# Patient Record
Sex: Female | Born: 1945 | Race: Black or African American | Hispanic: No | Smoking: Never smoker
Health system: Southern US, Community
[De-identification: ages and names within clinical notes are randomized; demographics above are authoritative.]

## PROBLEM LIST (undated history)

## (undated) DIAGNOSIS — T8859XA Other complications of anesthesia, initial encounter: Secondary | ICD-10-CM

## (undated) DIAGNOSIS — R0600 Dyspnea, unspecified: Secondary | ICD-10-CM

## (undated) DIAGNOSIS — IMO0001 Reserved for inherently not codable concepts without codable children: Secondary | ICD-10-CM

## (undated) DIAGNOSIS — T4145XA Adverse effect of unspecified anesthetic, initial encounter: Secondary | ICD-10-CM

## (undated) DIAGNOSIS — Z5189 Encounter for other specified aftercare: Secondary | ICD-10-CM

## (undated) DIAGNOSIS — Z86718 Personal history of other venous thrombosis and embolism: Secondary | ICD-10-CM

## (undated) DIAGNOSIS — M1990 Unspecified osteoarthritis, unspecified site: Secondary | ICD-10-CM

## (undated) DIAGNOSIS — I493 Ventricular premature depolarization: Secondary | ICD-10-CM

## (undated) DIAGNOSIS — R002 Palpitations: Secondary | ICD-10-CM

## (undated) HISTORY — PX: COLONOSCOPY: SHX174

---
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
## 1979-11-04 HISTORY — PX: TUBAL LIGATION: SHX77

## 1999-04-17 ENCOUNTER — Other Ambulatory Visit: Admission: RE | Admit: 1999-04-17 | Discharge: 1999-04-17 | Payer: Self-pay | Admitting: Obstetrics and Gynecology

## 2000-07-07 ENCOUNTER — Other Ambulatory Visit: Admission: RE | Admit: 2000-07-07 | Discharge: 2000-07-07 | Payer: Self-pay | Admitting: Obstetrics and Gynecology

## 2000-10-12 ENCOUNTER — Emergency Department (HOSPITAL_COMMUNITY): Admission: EM | Admit: 2000-10-12 | Discharge: 2000-10-12 | Payer: Self-pay | Admitting: Emergency Medicine

## 2002-07-14 ENCOUNTER — Other Ambulatory Visit: Admission: RE | Admit: 2002-07-14 | Discharge: 2002-07-14 | Payer: Self-pay | Admitting: Obstetrics and Gynecology

## 2002-08-22 ENCOUNTER — Encounter (HOSPITAL_BASED_OUTPATIENT_CLINIC_OR_DEPARTMENT_OTHER): Payer: Self-pay | Admitting: General Surgery

## 2002-08-22 ENCOUNTER — Encounter: Admission: RE | Admit: 2002-08-22 | Discharge: 2002-08-22 | Payer: Self-pay | Admitting: General Surgery

## 2003-09-14 ENCOUNTER — Other Ambulatory Visit: Admission: RE | Admit: 2003-09-14 | Discharge: 2003-09-14 | Payer: Self-pay | Admitting: Obstetrics and Gynecology

## 2004-09-18 ENCOUNTER — Other Ambulatory Visit: Admission: RE | Admit: 2004-09-18 | Discharge: 2004-09-18 | Payer: Self-pay | Admitting: Obstetrics and Gynecology

## 2005-02-08 ENCOUNTER — Encounter: Admission: RE | Admit: 2005-02-08 | Discharge: 2005-02-08 | Payer: Self-pay | Admitting: Internal Medicine

## 2005-09-23 ENCOUNTER — Other Ambulatory Visit: Admission: RE | Admit: 2005-09-23 | Discharge: 2005-09-23 | Payer: Self-pay | Admitting: Obstetrics and Gynecology

## 2005-11-03 HISTORY — PX: JOINT REPLACEMENT: SHX530

## 2006-05-11 ENCOUNTER — Inpatient Hospital Stay (HOSPITAL_COMMUNITY): Admission: RE | Admit: 2006-05-11 | Discharge: 2006-05-16 | Payer: Self-pay | Admitting: Cardiovascular Disease

## 2007-02-23 ENCOUNTER — Encounter: Admission: RE | Admit: 2007-02-23 | Discharge: 2007-02-23 | Payer: Self-pay | Admitting: Orthopedic Surgery

## 2010-11-03 HISTORY — PX: FOOT SURGERY: SHX648

## 2011-02-17 HISTORY — PX: OTHER SURGICAL HISTORY: SHX169

## 2011-02-17 HISTORY — PX: BUNIONECTOMY: SHX129

## 2011-09-14 ENCOUNTER — Other Ambulatory Visit: Payer: Self-pay | Admitting: Orthopedic Surgery

## 2011-09-14 NOTE — H&P (Signed)
Christina Duarte DOB: 01/17/1946  Date Of Admission:  09/29/2011  Chief Complaint:  Left Knee Pain  History of Present Illness The patient is a 65 year old female who comes in today for a preoperative History and Physical. The patient is scheduled for a left total knee arthroplasty to be performed by Dr. Frank V. Aluisio, MD at [facility] on 09/29/2011.  Allergies ASPIRIN WITH CODEINE. "feels like I am flying high"  Medication History Lipitor (40MG Tablet, Oral at bedtime) Active. Metoprolol Succinate (50MG Tablet ER 24HR, Oral at bedtime) Active. Meloxicam (15MG Tablet, Oral every twenty-four hours, as needed) Active. Centrum Ultra Womens ( Oral daily) Active. Vitamin D3 (2000UNIT Capsule, Oral daily) Active.  Family History Diabetes Mellitus. mother Heart Disease. father Hypertension. mother  Social History Alcohol use. never consumed alcohol Children. 3 Living situation. live with parents Marital status. 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 working full time   Past Surgical History Tonsillectomy. Date: 1964. Tubal Ligation. Date: 1981. Total Knee Replacement - Right. Date: 2007. Foot Surgery - Right. Date: 2012.  Medical History Osteoarthritis, knee (715.96) Diverticulitis Of Colon Hypercholesterolemia Palpitations Varicose veins Menopause Measles. Childhood  Review of Systems General:Not Present- Chills, Fever, Night Sweats, Appetite Loss, Fatigue, Feeling sick, Weight Gain and Weight Loss. Skin:Not Present- Itching, Rash, Skin Color Changes, Ulcer, Psoriasis and Change in Hair or Nails. HEENT:Not Present- Sensitivity to light, Nose Bleed, Visual Loss, Decreased Hearing and Ringing in the Ears. Neck:Not Present- Swollen Glands and Neck Mass. Respiratory:Not Present- Shortness of breath, Snoring, Chronic Cough and Bloody  sputum. Cardiovascular:Present- Palpitations. Not Present- Shortness of Breath, Chest Pain, Swelling of Extremities and Leg Cramps. Gastrointestinal:Not Present- Bloody Stool, Heartburn, Abdominal Pain, Vomiting, Nausea and Incontinence of Stool. Female Genitourinary:Not Present- Blood in Urine, Frequency, Incontinence and Nocturia. Musculoskeletal:Present- Muscle Weakness, Joint Stiffness, Joint Pain and Back Pain. Not Present- Muscle Pain and Joint Swelling. Neurological:Not Present- Tingling, Numbness, Burning, Tremor, Headaches and Dizziness. Psychiatric:Not Present- Anxiety, Depression and Memory Loss. Endocrine:Not Present- Cold Intolerance, Heat Intolerance, Excessive hunger and Excessive Thirst. Hematology:Not Present- Abnormal Bleeding, Abnormal bruising, Anemia and Blood Clots.  Vitals Weight: 216 lb Height: 68.5 in Weight was reported by patient. Height was reported by patient. Body Surface Area: 2.18 m Body Mass Index: 32.36 kg/m Pulse: 68 (Regular) Resp.: 14 (Unlabored) BP: 136/76 (Sitting, Right Arm, Standard)  Physical Exam The physical exam findings are as follows:  General-65 yo female  Mental Status - Alert, cooperative and good historian. General Appearance- pleasant. Not in acute distress. Orientation- Oriented X3. Build & Nutrition- Well nourished and Well developed. Head and Neck Head- normocephalic, atraumatic . Neck Global Assessment- supple. no bruit auscultated on the right and no bruit auscultated on the left.  Eye Pupil- Bilateral- PERRLA. Motion- Bilateral- EOMI. Note: Noted to wear glasses.  Chest and Lung Exam Auscultation: Breath sounds:- clear at anterior chest wall and - clear at posterior chest wall. Adventitious sounds:- No Adventitious sounds.  Cardiovascular Auscultation:Rhythm- Regular rate and rhythm. Heart Sounds- S1 WNL and S2 WNL. Murmurs & Other Heart Sounds:Auscultation of the heart  reveals - No Murmurs.  Abdomen Inspection:Contour- Note: Round Palpation/Percussion:Tenderness- Abdomen is non-tender to palpation. Rigidity (guarding)- Abdomen is soft. Auscultation:Auscultation of the abdomen reveals - Bowel sounds normal.  Female Genitourinary Note: Not done, not pertinent to present illness  Musculoskeletal Note: Well developed female alert and oriented in no apparent distress. Right knee incision is well healed with minimal scarring. Range of motion   on the right knee is about 0-115. There is no swelling, tenderness or instability. Left knee shows no effusion. She has a varus deformity. There is marked crepitus on range of motion. Range is about 5-115. She is tender medial greater than lateral with no instability noted. Pulse, sensation and motor are intact both lower extremities. Gait pattern is antalgic on the left side.   RADIOGRAPHS: AP both knees and lateral left show that she has advanced end stage arthritis of the left knee, bone on bone in the medial and patellofemoral compartments with a varus deformity. Her right knee shows a well placed prosthesis with no abnormalities.  Assessment & Plan Osteoarthritis, knee (715.96) Impression: Left Knee  Note: Plan is for a Left Total Knee Replacement. Risks and benefits of the surgery have been discussed with the patient and they elect to proceed with surgery.  There are on active contraindications to upcoming procedure such as ongoing infection or progressive neurological disease. Patient has been seen preoperatively by Dr. Richard Pang and has been cleared for surgery.  

## 2011-09-16 ENCOUNTER — Encounter (HOSPITAL_COMMUNITY): Payer: Self-pay

## 2011-09-18 ENCOUNTER — Ambulatory Visit (HOSPITAL_COMMUNITY)
Admission: RE | Admit: 2011-09-18 | Discharge: 2011-09-18 | Disposition: A | Discharge status: Home / Self Care | Payer: Federal, State, Local not specified - PPO | Source: Ambulatory Visit | Attending: Orthopedic Surgery | Admitting: Orthopedic Surgery

## 2011-09-18 ENCOUNTER — Encounter (HOSPITAL_COMMUNITY): Payer: Self-pay

## 2011-09-18 ENCOUNTER — Encounter (HOSPITAL_COMMUNITY)
Admission: RE | Admit: 2011-09-18 | Discharge: 2011-09-18 | Payer: Federal, State, Local not specified - PPO | Source: Ambulatory Visit | Attending: Orthopedic Surgery | Admitting: Orthopedic Surgery

## 2011-09-18 DIAGNOSIS — R002 Palpitations: Secondary | ICD-10-CM | POA: Insufficient documentation

## 2011-09-18 DIAGNOSIS — M47814 Spondylosis without myelopathy or radiculopathy, thoracic region: Secondary | ICD-10-CM | POA: Insufficient documentation

## 2011-09-18 DIAGNOSIS — Z01812 Encounter for preprocedural laboratory examination: Secondary | ICD-10-CM | POA: Insufficient documentation

## 2011-09-18 DIAGNOSIS — M1710 Unilateral primary osteoarthritis, unspecified knee: Secondary | ICD-10-CM | POA: Insufficient documentation

## 2011-09-18 HISTORY — DX: Unspecified osteoarthritis, unspecified site: M19.90

## 2011-09-18 HISTORY — DX: Reserved for inherently not codable concepts without codable children: IMO0001

## 2011-09-18 HISTORY — DX: Encounter for other specified aftercare: Z51.89

## 2011-09-18 HISTORY — DX: Ventricular premature depolarization: I49.3

## 2011-09-18 LAB — COMPREHENSIVE METABOLIC PANEL
Albumin: 4.1 g/dL (ref 3.5–5.2)
Alkaline Phosphatase: 109 U/L (ref 39–117)
BUN: 21 mg/dL (ref 6–23)
Calcium: 9.9 mg/dL (ref 8.4–10.5)
Creatinine, Ser: 0.7 mg/dL (ref 0.50–1.10)
GFR calc Af Amer: >90 mL/min (ref >90)
Glucose, Bld: 83 mg/dL (ref 70–99)
Potassium: 3.9 mEq/L (ref 3.5–5.1)
Total Protein: 7.3 g/dL (ref 6.0–8.3)

## 2011-09-18 LAB — URINALYSIS, ROUTINE W REFLEX MICROSCOPIC
Leukocytes, UA: NEGATIVE
Nitrite: NEGATIVE
Protein, ur: NEGATIVE mg/dL
Specific Gravity, Urine: 1.015 (ref 1.005–1.030)
Urobilinogen, UA: 0.2 mg/dL (ref 0.0–1.0)

## 2011-09-18 LAB — CBC
HCT: 41.4 % (ref 36.0–46.0)
MCH: 27.8 pg (ref 26.0–34.0)
MCHC: 32.9 g/dL (ref 30.0–36.0)
RDW: 14.6 % (ref 11.5–15.5)

## 2011-09-18 LAB — PROTIME-INR
INR: 0.98 (ref 0.00–1.49)
Prothrombin Time: 13.2 seconds (ref 11.6–15.2)

## 2011-09-18 LAB — APTT: aPTT: 40 seconds — ABNORMAL HIGH (ref 24–37)

## 2011-09-18 MED ORDER — CHLORHEXIDINE GLUCONATE 4 % EX LIQD: 60.0000 mL | Freq: Once | CUTANEOUS | Status: DC

## 2011-09-18 NOTE — Pre-Procedure Instructions (Signed)
EKG REPORT AND OFFICE NOTE AND MEDICAL CLEARANCE FOR LEFT TOTAL KNEE ARTHROPLASTY SURGERY ON CHART FROM DR. PANG. CXR WILL BE DONE TODAY IN [facility]  09/18/11 AT [facility].

## 2011-09-18 NOTE — Patient Instructions (Signed)
20 Christina Duarte  09/18/2011   Your procedure is scheduled on:  Monday 11/26  AT 11:15 AM  Report to [facility] at  8:45 AM.  Call this number if you have problems the morning of surgery: [phone]   Remember:   Do not eat food:After Midnight.  Do not drink clear liquids: After Midnight.  Take these medicines the morning of surgery with A SIP OF WATER: NO MEDS TO TAKE   Do not wear jewelry, make-up or nail polish.  Do not wear lotions, powders, or perfumes. You may wear deodorant.  Do not shave 48 hours prior to surgery.  Do not bring valuables to the hospital.  Contacts, dentures or bridgework may not be worn into surgery.  Leave suitcase in the car. After surgery it may be brought to your room.  For patients admitted to the hospital, checkout time is 11:00 AM the day of discharge.   Patients discharged the day of surgery will not be allowed to drive home.  Name and phone number of your driver:   Special Instructions: CHG Shower Use Special Wash: 1/2 bottle night before surgery and 1/2 bottle morning of surgery.   Please read over the following fact sheets that you were given: Blood Transfusion Information and MRSA Information, INCENTIVE SPIROMETRY

## 2011-09-28 MED ORDER — BUPIVACAINE 0.25 % ON-Q PUMP SINGLE CATH 300ML
300.0000 mL | INJECTION | Status: DC
  Administered 2011-09-29: 300 mL
  Filled 2011-09-28: qty 300

## 2011-09-29 ENCOUNTER — Encounter (HOSPITAL_COMMUNITY): Payer: Self-pay | Admitting: Anesthesiology

## 2011-09-29 ENCOUNTER — Inpatient Hospital Stay (HOSPITAL_COMMUNITY)
Admission: RE | Admit: 2011-09-29 | Discharge: 2011-10-02 | DRG: 209 | Disposition: A | Discharge status: Skilled Nursing Facility | Payer: Federal, State, Local not specified - PPO | Source: Ambulatory Visit | Attending: Orthopedic Surgery | Admitting: Orthopedic Surgery

## 2011-09-29 ENCOUNTER — Inpatient Hospital Stay (HOSPITAL_COMMUNITY): Payer: Federal, State, Local not specified - PPO | Admitting: Anesthesiology

## 2011-09-29 ENCOUNTER — Encounter (HOSPITAL_COMMUNITY)
Admission: RE | Disposition: A | Discharge status: Skilled Nursing Facility | Payer: Self-pay | Source: Ambulatory Visit | Attending: Orthopedic Surgery

## 2011-09-29 ENCOUNTER — Encounter (HOSPITAL_COMMUNITY): Payer: Self-pay | Admitting: *Deleted

## 2011-09-29 ENCOUNTER — Encounter (HOSPITAL_COMMUNITY): Payer: Self-pay | Admitting: Orthopedic Surgery

## 2011-09-29 DIAGNOSIS — M1712 Unilateral primary osteoarthritis, left knee: Secondary | ICD-10-CM | POA: Diagnosis present

## 2011-09-29 DIAGNOSIS — D62 Acute posthemorrhagic anemia: Secondary | ICD-10-CM | POA: Diagnosis not present

## 2011-09-29 DIAGNOSIS — I8390 Asymptomatic varicose veins of unspecified lower extremity: Secondary | ICD-10-CM | POA: Diagnosis present

## 2011-09-29 DIAGNOSIS — E871 Hypo-osmolality and hyponatremia: Secondary | ICD-10-CM | POA: Diagnosis not present

## 2011-09-29 DIAGNOSIS — IMO0002 Reserved for concepts with insufficient information to code with codable children: Principal | ICD-10-CM | POA: Diagnosis present

## 2011-09-29 DIAGNOSIS — M21169 Varus deformity, not elsewhere classified, unspecified knee: Secondary | ICD-10-CM | POA: Diagnosis present

## 2011-09-29 DIAGNOSIS — M1710 Unilateral primary osteoarthritis, unspecified knee: Principal | ICD-10-CM | POA: Diagnosis present

## 2011-09-29 DIAGNOSIS — Z780 Asymptomatic menopausal state: Secondary | ICD-10-CM

## 2011-09-29 DIAGNOSIS — E876 Hypokalemia: Secondary | ICD-10-CM | POA: Diagnosis not present

## 2011-09-29 DIAGNOSIS — K5730 Diverticulosis of large intestine without perforation or abscess without bleeding: Secondary | ICD-10-CM | POA: Diagnosis present

## 2011-09-29 DIAGNOSIS — E7800 Pure hypercholesterolemia, unspecified: Secondary | ICD-10-CM | POA: Diagnosis present

## 2011-09-29 DIAGNOSIS — Z96659 Presence of unspecified artificial knee joint: Secondary | ICD-10-CM

## 2011-09-29 DIAGNOSIS — Z79899 Other long term (current) drug therapy: Secondary | ICD-10-CM

## 2011-09-29 HISTORY — PX: TOTAL KNEE ARTHROPLASTY: SHX125

## 2011-09-29 LAB — TYPE AND SCREEN
ABO/RH(D): O NEG
Antibody Screen: NEGATIVE

## 2011-09-29 SURGERY — ARTHROPLASTY, KNEE, TOTAL
Anesthesia: Spinal | Site: Knee | Laterality: Left | Wound class: Clean

## 2011-09-29 MED ORDER — ROSUVASTATIN CALCIUM 20 MG PO TABS
20.0000 mg | ORAL_TABLET | Freq: Every day | ORAL | Status: DC
  Administered 2011-09-29 – 2011-10-02 (×4): 20 mg via ORAL
  Filled 2011-09-29 (×4): qty 1

## 2011-09-29 MED ORDER — HYDROMORPHONE HCL PF 2 MG/ML IJ SOLN: 0.2500 mg | INTRAMUSCULAR | Status: DC | PRN

## 2011-09-29 MED ORDER — CEFAZOLIN SODIUM-DEXTROSE 2-3 GM-% IV SOLR
2.0000 g | Freq: Once | INTRAVENOUS | Status: AC
  Administered 2011-09-29: 2 g via INTRAVENOUS

## 2011-09-29 MED ORDER — NALOXONE HCL 0.4 MG/ML IJ SOLN: 0.4000 mg | INTRAMUSCULAR | Status: DC | PRN

## 2011-09-29 MED ORDER — DOCUSATE SODIUM 100 MG PO CAPS
100.0000 mg | ORAL_CAPSULE | Freq: Two times a day (BID) | ORAL | Status: DC
  Administered 2011-09-29 – 2011-10-02 (×6): 100 mg via ORAL
  Filled 2011-09-29 (×8): qty 1

## 2011-09-29 MED ORDER — POTASSIUM CHLORIDE IN NACL 20-0.9 MEQ/L-% IV SOLN
INTRAVENOUS | Status: DC
  Administered 2011-09-29 – 2011-09-30 (×4): via INTRAVENOUS
  Filled 2011-09-29 (×3): qty 1000

## 2011-09-29 MED ORDER — ONDANSETRON HCL 4 MG/2ML IJ SOLN: 4.0000 mg | Freq: Four times a day (QID) | INTRAMUSCULAR | Status: DC | PRN

## 2011-09-29 MED ORDER — DIPHENHYDRAMINE HCL 50 MG/ML IJ SOLN: 12.5000 mg | Freq: Four times a day (QID) | INTRAMUSCULAR | Status: DC | PRN

## 2011-09-29 MED ORDER — FLEET ENEMA 7-19 GM/118ML RE ENEM: 1.0000 | ENEMA | Freq: Every day | RECTAL | Status: DC | PRN

## 2011-09-29 MED ORDER — SODIUM CHLORIDE 0.9 % IR SOLN
Status: DC | PRN
  Administered 2011-09-29: 3000 mL

## 2011-09-29 MED ORDER — DIPHENHYDRAMINE HCL 12.5 MG/5ML PO ELIX: 12.5000 mg | ORAL_SOLUTION | ORAL | Status: DC | PRN

## 2011-09-29 MED ORDER — POLYETHYLENE GLYCOL 3350 17 G PO PACK
17.0000 g | PACK | Freq: Every day | ORAL | Status: DC | PRN
  Filled 2011-09-29: qty 1

## 2011-09-29 MED ORDER — PROMETHAZINE HCL 25 MG/ML IJ SOLN: 6.2500 mg | INTRAMUSCULAR | Status: DC | PRN

## 2011-09-29 MED ORDER — ONDANSETRON HCL 4 MG/2ML IJ SOLN
4.0000 mg | Freq: Four times a day (QID) | INTRAMUSCULAR | Status: DC | PRN
  Administered 2011-09-30: 4 mg via INTRAVENOUS
  Filled 2011-09-29: qty 2

## 2011-09-29 MED ORDER — ACETAMINOPHEN 650 MG RE SUPP: 650.0000 mg | Freq: Four times a day (QID) | RECTAL | Status: DC | PRN

## 2011-09-29 MED ORDER — CEFAZOLIN SODIUM 1-5 GM-% IV SOLN
1.0000 g | Freq: Four times a day (QID) | INTRAVENOUS | Status: AC
  Administered 2011-09-29 – 2011-09-30 (×3): 1 g via INTRAVENOUS
  Filled 2011-09-29 (×4): qty 50

## 2011-09-29 MED ORDER — RIVAROXABAN 10 MG PO TABS
10.0000 mg | ORAL_TABLET | ORAL | Status: DC
  Administered 2011-09-30 – 2011-10-02 (×3): 10 mg via ORAL
  Filled 2011-09-29 (×3): qty 1

## 2011-09-29 MED ORDER — ACETAMINOPHEN 10 MG/ML IV SOLN
INTRAVENOUS | Status: DC | PRN
  Administered 2011-09-29: 1000 mg via INTRAVENOUS

## 2011-09-29 MED ORDER — METHOCARBAMOL 100 MG/ML IJ SOLN: 500.0000 mg | Freq: Four times a day (QID) | INTRAVENOUS | Status: DC | PRN

## 2011-09-29 MED ORDER — ACETAMINOPHEN 325 MG PO TABS: 650.0000 mg | ORAL_TABLET | Freq: Four times a day (QID) | ORAL | Status: DC | PRN

## 2011-09-29 MED ORDER — LACTATED RINGERS IV SOLN: INTRAVENOUS | Status: DC

## 2011-09-29 MED ORDER — METOCLOPRAMIDE HCL 5 MG/ML IJ SOLN: 5.0000 mg | Freq: Three times a day (TID) | INTRAMUSCULAR | Status: DC | PRN

## 2011-09-29 MED ORDER — ONDANSETRON HCL 4 MG/2ML IJ SOLN
INTRAMUSCULAR | Status: DC | PRN
  Administered 2011-09-29: 4 mg via INTRAVENOUS

## 2011-09-29 MED ORDER — MORPHINE SULFATE (PF) 1 MG/ML IV SOLN
INTRAVENOUS | Status: DC
  Administered 2011-09-29: 14:00:00 via INTRAVENOUS
  Filled 2011-09-29 (×2): qty 25

## 2011-09-29 MED ORDER — MAGNESIUM HYDROXIDE 400 MG/5ML PO SUSP: 30.0000 mL | Freq: Two times a day (BID) | ORAL | Status: DC | PRN

## 2011-09-29 MED ORDER — BISACODYL 10 MG RE SUPP: 10.0000 mg | Freq: Every day | RECTAL | Status: DC | PRN

## 2011-09-29 MED ORDER — PHENOL 1.4 % MT LIQD: 1.0000 | OROMUCOSAL | Status: DC | PRN

## 2011-09-29 MED ORDER — METOCLOPRAMIDE HCL 10 MG PO TABS: 5.0000 mg | ORAL_TABLET | Freq: Three times a day (TID) | ORAL | Status: DC | PRN

## 2011-09-29 MED ORDER — ACETAMINOPHEN 10 MG/ML IV SOLN
1000.0000 mg | Freq: Four times a day (QID) | INTRAVENOUS | Status: AC
  Administered 2011-09-29 – 2011-09-30 (×4): 1000 mg via INTRAVENOUS
  Filled 2011-09-29 (×6): qty 100

## 2011-09-29 MED ORDER — PROPOFOL 10 MG/ML IV EMUL
INTRAVENOUS | Status: DC | PRN
  Administered 2011-09-29: 75 ug/kg/min via INTRAVENOUS

## 2011-09-29 MED ORDER — OXYCODONE HCL 5 MG PO TABS
5.0000 mg | ORAL_TABLET | ORAL | Status: DC | PRN
  Administered 2011-09-30 – 2011-10-02 (×8): 10 mg via ORAL
  Filled 2011-09-29 (×8): qty 2

## 2011-09-29 MED ORDER — DIPHENHYDRAMINE HCL 12.5 MG/5ML PO ELIX
12.5000 mg | ORAL_SOLUTION | Freq: Four times a day (QID) | ORAL | Status: DC | PRN
  Filled 2011-09-29: qty 5

## 2011-09-29 MED ORDER — SODIUM CHLORIDE 0.9 % IR SOLN
Status: DC | PRN
  Administered 2011-09-29: 1000 mL

## 2011-09-29 MED ORDER — METHOCARBAMOL 500 MG PO TABS
500.0000 mg | ORAL_TABLET | Freq: Four times a day (QID) | ORAL | Status: DC | PRN
  Administered 2011-09-29 – 2011-10-01 (×4): 500 mg via ORAL
  Filled 2011-09-29 (×4): qty 1

## 2011-09-29 MED ORDER — MEPERIDINE HCL 50 MG/ML IJ SOLN: 6.2500 mg | INTRAMUSCULAR | Status: DC | PRN

## 2011-09-29 MED ORDER — LIDOCAINE HCL (CARDIAC) 20 MG/ML IV SOLN
INTRAVENOUS | Status: DC | PRN
  Administered 2011-09-29: 40 mg via INTRAVENOUS

## 2011-09-29 MED ORDER — METOPROLOL SUCCINATE ER 50 MG PO TB24
50.0000 mg | ORAL_TABLET | Freq: Every day | ORAL | Status: DC
  Administered 2011-09-29 – 2011-10-01 (×3): 50 mg via ORAL
  Filled 2011-09-29 (×5): qty 1

## 2011-09-29 MED ORDER — BUPIVACAINE ON-Q PAIN PUMP (FOR ORDER SET NO CHG)
INJECTION | Status: DC
  Filled 2011-09-29: qty 1

## 2011-09-29 MED ORDER — MENTHOL 3 MG MT LOZG: 1.0000 | LOZENGE | OROMUCOSAL | Status: DC | PRN

## 2011-09-29 MED ORDER — BISACODYL 5 MG PO TBEC: 10.0000 mg | DELAYED_RELEASE_TABLET | Freq: Every day | ORAL | Status: DC | PRN

## 2011-09-29 MED ORDER — TEMAZEPAM 15 MG PO CAPS: 15.0000 mg | ORAL_CAPSULE | Freq: Every evening | ORAL | Status: DC | PRN

## 2011-09-29 MED ORDER — BUPIVACAINE HCL 0.75 % IJ SOLN
INTRAMUSCULAR | Status: DC | PRN
  Administered 2011-09-29: 1.8 mL via INTRATHECAL

## 2011-09-29 MED ORDER — LACTATED RINGERS IV SOLN
INTRAVENOUS | Status: DC
  Administered 2011-09-29: 12:00:00 via INTRAVENOUS
  Administered 2011-09-29: 1000 mL via INTRAVENOUS

## 2011-09-29 MED ORDER — SODIUM CHLORIDE 0.9 % IJ SOLN: 9.0000 mL | INTRAMUSCULAR | Status: DC | PRN

## 2011-09-29 MED ORDER — ONDANSETRON HCL 4 MG PO TABS: 4.0000 mg | ORAL_TABLET | Freq: Four times a day (QID) | ORAL | Status: DC | PRN

## 2011-09-29 MED ORDER — MIDAZOLAM HCL 5 MG/5ML IJ SOLN
INTRAMUSCULAR | Status: DC | PRN
  Administered 2011-09-29: 2 mg via INTRAVENOUS

## 2011-09-29 SURGICAL SUPPLY — 53 items
BAG SPEC THK2 15X12 ZIP CLS (MISCELLANEOUS) ×1
BAG ZIPLOCK 12X15 (MISCELLANEOUS) ×2 IMPLANT
BANDAGE ELASTIC 6 VELCRO ST LF (GAUZE/BANDAGES/DRESSINGS) ×2 IMPLANT
BANDAGE ESMARK 6X9 LF (GAUZE/BANDAGES/DRESSINGS) ×1 IMPLANT
BLADE SAG 18X100X1.27 (BLADE) ×2 IMPLANT
BLADE SAW SGTL 11.0X1.19X90.0M (BLADE) ×2 IMPLANT
BNDG CMPR 9X6 STRL LF SNTH (GAUZE/BANDAGES/DRESSINGS) ×1
BNDG ESMARK 6X9 LF (GAUZE/BANDAGES/DRESSINGS) ×2
BONE CEMENT GENTAMICIN (Cement) ×4 IMPLANT
BOWL SMART MIX CTS (DISPOSABLE) ×2 IMPLANT
CATH KIT ON-Q SILVERSOAK 5 (CATHETERS) ×1 IMPLANT
CATH KIT ON-Q SILVERSOAK 5IN (CATHETERS) ×2 IMPLANT
CEMENT BONE GENTAMICIN 40 (Cement) IMPLANT
CLOSURE STERI STRIP 1/2 X4 (GAUZE/BANDAGES/DRESSINGS) ×2 IMPLANT
CLOTH BEACON ORANGE TIMEOUT ST (SAFETY) ×2 IMPLANT
CUFF TOURN SGL QUICK 34 (TOURNIQUET CUFF) ×2
CUFF TRNQT CYL 34X4X40X1 (TOURNIQUET CUFF) ×1 IMPLANT
DRAPE EXTREMITY T 121X128X90 (DRAPE) ×2 IMPLANT
DRAPE POUCH INSTRU U-SHP 10X18 (DRAPES) ×2 IMPLANT
DRAPE U-SHAPE 47X51 STRL (DRAPES) ×2 IMPLANT
DRSG ADAPTIC 3X8 NADH LF (GAUZE/BANDAGES/DRESSINGS) ×2 IMPLANT
DRSG PAD ABDOMINAL 8X10 ST (GAUZE/BANDAGES/DRESSINGS) ×1 IMPLANT
DURAPREP 26ML APPLICATOR (WOUND CARE) ×2 IMPLANT
ELECT REM PT RETURN 9FT ADLT (ELECTROSURGICAL) ×2
ELECTRODE REM PT RTRN 9FT ADLT (ELECTROSURGICAL) ×1 IMPLANT
EVACUATOR 1/8 PVC DRAIN (DRAIN) ×2 IMPLANT
FACESHIELD LNG OPTICON STERILE (SAFETY) ×10 IMPLANT
GAUZE SPONGE 4X4 12PLY STRL LF (GAUZE/BANDAGES/DRESSINGS) ×1 IMPLANT
GLOVE BIO SURGEON STRL SZ7.5 (GLOVE) ×2 IMPLANT
GLOVE BIO SURGEON STRL SZ8 (GLOVE) ×2 IMPLANT
GLOVE BIOGEL PI IND STRL 8 (GLOVE) ×2 IMPLANT
GLOVE BIOGEL PI INDICATOR 8 (GLOVE) ×2
GOWN PREVENTION PLUS XLARGE (GOWN DISPOSABLE) ×2 IMPLANT
GOWN STRL REIN XL XLG (GOWN DISPOSABLE) ×2 IMPLANT
HANDPIECE INTERPULSE COAX TIP (DISPOSABLE) ×2
IMMOBILIZER KNEE 20 (SOFTGOODS) ×2
IMMOBILIZER KNEE 20 THIGH 36 (SOFTGOODS) ×1 IMPLANT
KIT BASIN OR (CUSTOM PROCEDURE TRAY) ×2 IMPLANT
MANIFOLD NEPTUNE II (INSTRUMENTS) ×2 IMPLANT
NS IRRIG 1000ML POUR BTL (IV SOLUTION) ×2 IMPLANT
PACK TOTAL JOINT (CUSTOM PROCEDURE TRAY) ×2 IMPLANT
PADDING WEBRIL 6 STERILE (GAUZE/BANDAGES/DRESSINGS) ×3 IMPLANT
POSITIONER SURGICAL ARM (MISCELLANEOUS) ×2 IMPLANT
SET HNDPC FAN SPRY TIP SCT (DISPOSABLE) ×1 IMPLANT
SUCTION FRAZIER 12FR DISP (SUCTIONS) ×2 IMPLANT
SUT MNCRL AB 4-0 PS2 18 (SUTURE) ×2 IMPLANT
SUT PDS AB 1 CT1 27 (SUTURE) ×6 IMPLANT
SUT VIC AB 2-0 CT1 27 (SUTURE) ×6
SUT VIC AB 2-0 CT1 TAPERPNT 27 (SUTURE) ×3 IMPLANT
TOWEL OR 17X26 10 PK STRL BLUE (TOWEL DISPOSABLE) ×4 IMPLANT
TRAY FOLEY CATH 14FRSI W/METER (CATHETERS) ×2 IMPLANT
WATER STERILE IRR 1500ML POUR (IV SOLUTION) ×2 IMPLANT
WRAP KNEE MAXI GEL POST OP (GAUZE/BANDAGES/DRESSINGS) ×3 IMPLANT

## 2011-09-29 NOTE — Preoperative (Signed)
Beta Blockers   Reason not to administer Beta Blockers:Pt took Metoprolol 09-28-11 at 2200

## 2011-09-29 NOTE — Anesthesia Postprocedure Evaluation (Signed)
  Anesthesia Post-op Note  Patient: Christina Duarte  Procedure(s) Performed:  TOTAL KNEE ARTHROPLASTY  Patient Location: PACU  Anesthesia Type: Spinal  Level of Consciousness: awake and alert   Airway and Oxygen Therapy: Patient Spontanous Breathing  Post-op Pain: mild  Post-op Assessment: Post-op Vital signs reviewed, Patient's Cardiovascular Status Stable, Respiratory Function Stable, Patent Airway and No signs of Nausea or vomiting  Post-op Vital Signs: stable  Complications: No apparent anesthesia complications

## 2011-09-29 NOTE — Transfer of Care (Signed)
Immediate Anesthesia Transfer of Care Note  Patient: Christina Duarte  Procedure(s) Performed:  TOTAL KNEE ARTHROPLASTY  Patient Location: PACU  Anesthesia Type: Spinal  Level of Consciousness: sedated, patient cooperative and responds to stimulaton  Airway & Oxygen Therapy: Patient Spontanous Breathing and Patient connected to face mask oxgen  Post-op Assessment: Report given to PACU RN and Post -op Vital signs reviewed and stable  Post vital signs: Reviewed and stable  Complications: No apparent anesthesia complications

## 2011-09-29 NOTE — Anesthesia Procedure Notes (Addendum)
Spinal  Patient location during procedure: OR Staffing Performed by: anesthesiologist  Preanesthetic Checklist Completed: patient identified, site marked, surgical consent, pre-op evaluation, timeout performed, IV checked, risks and benefits discussed and monitors and equipment checked Spinal Block Patient position: sitting Prep: Betadine Patient monitoring: heart rate, continuous pulse ox and blood pressure Location: L3-4 Injection technique: single-shot Needle Needle type: Spinocan  Needle gauge: 22 G Needle length: 9 cm Assessment Sensory level: T10 Additional Notes Expiration date of kit checked and confirmed. Patient tolerated procedure well, without complications.     

## 2011-09-29 NOTE — Op Note (Signed)
Pre-operative diagnosis- Osteoarthritis  Left knee(s)  Post-operative diagnosis- Osteoarthritis Left knee(s)  Procedure- Left total knee arthroplasty  Surgeon- Gus Rankin. Bayler Gehrig, MD  Assistant- [name], PA-C   Anesthesia-  Spinal EBL-* No blood loss amount entered *  Drains Hemovac  Tourniquet time-  Total Tourniquet Time Documented: Thigh (Left) - 35 minutes   Complications- None  Condition-PACU - hemodynamically stable.   Brief Clinical Note  Christina Duarte is a 65 y.o. year old female with end stage OA of her left knee with progressively worsening pain and dysfunction. She has constant pain, with activity and at rest and significant functional deficits with difficulties even with ADLs. She has had extensive non-op management including analgesics, injections of cortisone and viscosupplements, and home exercise program, but remains in significant pain with significant dysfunction. Radiographs show bone on bone arthritis with significant varus deformity. She presents now for left Total Knee Arthroplasty.    Procedure in detail---   The patient is brought into the operating room and positioned supine on the operating table. After successful administration of  Spinal,   a tourniquet is placed high on the  Left thigh(s) and the lower extremity is prepped and draped in the usual sterile fashion. Time out is performed by the operating team and then the  Left lower extremity is wrapped in Esmarch, knee flexed and the tourniquet inflated to 300 mmHg.       A midline incision is made with a ten blade through the subcutaneous tissue to the level of the extensor mechanism. A fresh blade is used to make a medial parapatellar arthrotomy. Soft tissue over the proximal medial tibia is subperiosteally elevated to the joint line with a knife and into the semimembranosus bursa with a Cobb elevator. Soft tissue over the proximal lateral tibia is elevated with attention being paid to avoiding the patellar  tendon on the tibial tubercle. The patella is everted, knee flexed 90 degrees and the ACL and PCL are removed. Findings are bone on bone changes in the medial and patellofemoral compartments with large marginal osteophytes medially.        The drill is used to create a starting hole in the distal femur and the canal is thoroughly irrigated with sterile saline to remove the fatty contents. The 5 degree Left  valgus alignment guide is placed into the femoral canal and the distal femoral cutting block is pinned to remove 11 mm off the distal femur. Resection is made with an oscillating saw.      The tibia is subluxed forward and the menisci are removed. The extramedullary alignment guide is placed referencing proximally at the medial aspect of the tibial tubercle and distally along the second metatarsal axis and tibial crest. The block is pinned to remove 2mm off the more deficient medial  side. Resection is made with an oscillating saw. Size 3is the most appropriate size for the tibia and the proximal tibia is prepared with the modular drill and keel punch for that size.      The femoral sizing guide is placed and size 4 is most appropriate. Rotation is marked off the epicondylar axis and confirmed by creating a rectangular flexion gap at 90 degrees. The size 4 cutting block is pinned in this rotation and the anterior, posterior and chamfer cuts are made with the oscillating saw. The intercondylar block is then placed and that cut is made.      Trial size 3 tibial component, trial size 4 narrow  posterior  stabilized femur and a 10  mm posterior stabilized rotating platform insert trial is placed. Full extension is achieved with excellent varus/valgus and anterior/posterior balance throughout full range of motion. The patella is everted and thickness measured to be 22  mm. Free hand resection is taken to 12 mm, a 38 template is placed, lug holes are drilled, trial patella is placed, and it tracks normally.  Osteophytes are removed off the posterior femur with the trial in place. All trials are removed and the cut bone surfaces prepared with pulsatile lavage. Cement is mixed and once ready for implantation, the size 3 tibial implant, size 4 narrow posterior stabilized femoral component, and the size 38 patella are cemented in place and the patella is held with the clamp. The trial insert is placed and the knee held in full extension. All extruded cement is removed and once the cement is hard the permanent 10 mm posterior stabilized rotating platform insert is placed into the tibial tray.      The wound is copiously irrigated with saline solution and the extensor mechanism closed over a hemovac drain with #1 PDS suture. The tourniquet is released for a total tourniquet time of 35  minutes. Flexion against gravity is 140 degrees and the patella tracks normally. Subcutaneous tissue is closed with 2.0 vicryl and subcuticular with running 4.0 Monocryl. The catheter for the Marcaine pain pump is placed and the pump is initiated. The incision is cleaned and dried and steri-strips and a bulky sterile dressing are applied. The limb is placed into a knee immobilizer and the patient is awakened and transported to recovery in stable condition.      Please note that a surgical assistant was a medical necessity for this procedure in order to perform it in a safe and expeditious manner. Surgical assistant was necessary to retract the ligaments and vital neurovascular structures to prevent injury to them and also necessary for proper positioning of the limb to allow for anatomic placement of the prosthesis.   Gus Rankin [name], MD    09/29/2011, 12:32 PM

## 2011-09-29 NOTE — H&P (View-Only) (Signed)
Christina Duarte DOB: 04-23-1946  Date Of Admission:  09/29/2011  Chief Complaint:  Left Knee Pain  History of Present Illness The patient is a 65 year old female who comes in today for a preoperative History and Physical. The patient is scheduled for a left total knee arthroplasty to be performed by Dr. Gus Rankin. Aluisio, MD at [facility] on 09/29/2011.  Allergies ASPIRIN WITH CODEINE. "feels like I am flying high"  Medication History Lipitor (40MG  Tablet, Oral at bedtime) Active. Metoprolol Succinate (50MG  Tablet ER 24HR, Oral at bedtime) Active. Meloxicam (15MG  Tablet, Oral every twenty-four hours, as needed) Active. Centrum Ultra Womens ( Oral daily) Active. Vitamin D3 (2000UNIT Capsule, Oral daily) Active.  Family History Diabetes Mellitus. mother Heart Disease. father Hypertension. mother  Social History Alcohol use. never consumed alcohol Children. 3 Living situation. live with parents Marital status. 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 working full time   Past Surgical History Tonsillectomy. Date: 1964. Tubal Ligation. Date: 61. Total Knee Replacement - Right. Date: 2007. Foot Surgery - Right. Date: 2012.  Medical History Osteoarthritis, knee (715.96) Diverticulitis Of Colon Hypercholesterolemia Palpitations Varicose veins Menopause Measles. Childhood  Review of Systems General:Not Present- Chills, Fever, Night Sweats, Appetite Loss, Fatigue, Feeling sick, Weight Gain and Weight Loss. Skin:Not Present- Itching, Rash, Skin Color Changes, Ulcer, Psoriasis and Change in Hair or Nails. HEENT:Not Present- Sensitivity to light, Nose Bleed, Visual Loss, Decreased Hearing and Ringing in the Ears. Neck:Not Present- Swollen Glands and Neck Mass. Respiratory:Not Present- Shortness of breath, Snoring, Chronic Cough and Bloody  sputum. Cardiovascular:Present- Palpitations. Not Present- Shortness of Breath, Chest Pain, Swelling of Extremities and Leg Cramps. Gastrointestinal:Not Present- Bloody Stool, Heartburn, Abdominal Pain, Vomiting, Nausea and Incontinence of Stool. Female Genitourinary:Not Present- Blood in Urine, Frequency, Incontinence and Nocturia. Musculoskeletal:Present- Muscle Weakness, Joint Stiffness, Joint Pain and Back Pain. Not Present- Muscle Pain and Joint Swelling. Neurological:Not Present- Tingling, Numbness, Burning, Tremor, Headaches and Dizziness. Psychiatric:Not Present- Anxiety, Depression and Memory Loss. Endocrine:Not Present- Cold Intolerance, Heat Intolerance, Excessive hunger and Excessive Thirst. Hematology:Not Present- Abnormal Bleeding, Abnormal bruising, Anemia and Blood Clots.  Vitals Weight: 216 lb Height: 68.5 in Weight was reported by patient. Height was reported by patient. Body Surface Area: 2.18 m Body Mass Index: 32.36 kg/m Pulse: 68 (Regular) Resp.: 14 (Unlabored) BP: 136/76 (Sitting, Right Arm, Standard)  Physical Exam The physical exam findings are as follows:  [place] yo female  Mental Status - Alert, cooperative and good historian. General Appearance- pleasant. Not in acute distress. Orientation- Oriented X3. Build & Nutrition- Well nourished and Well developed. Head and Neck Head- normocephalic, atraumatic . Neck Global Assessment- supple. no bruit auscultated on the right and no bruit auscultated on the left.  Eye Pupil- Bilateral- PERRLA. Motion- Bilateral- EOMI. Note: Noted to wear glasses.  Chest and Lung Exam Auscultation: Breath sounds:- clear at anterior chest wall and - clear at posterior chest wall. Adventitious sounds:- No Adventitious sounds.  Cardiovascular Auscultation:Rhythm- Regular rate and rhythm. Heart Sounds- S1 WNL and S2 WNL. Murmurs & Other Heart Sounds:Auscultation of the heart  reveals - No Murmurs.  Abdomen Inspection:Contour- Note: Round Palpation/Percussion:Tenderness- Abdomen is non-tender to palpation. Rigidity (guarding)- Abdomen is soft. Auscultation:Auscultation of the abdomen reveals - Bowel sounds normal.  Female Genitourinary Note: Not done, not pertinent to present illness  Musculoskeletal Note: Well developed female alert and oriented in no apparent distress. Right knee incision is well healed with minimal scarring. Range of motion  on the right knee is about 0-115. There is no swelling, tenderness or instability. Left knee shows no effusion. She has a varus deformity. There is marked crepitus on range of motion. Range is about 5-115. She is tender medial greater than lateral with no instability noted. Pulse, sensation and motor are intact both lower extremities. Gait pattern is antalgic on the left side.   RADIOGRAPHS: AP both knees and lateral left show that she has advanced end stage arthritis of the left knee, bone on bone in the medial and patellofemoral compartments with a varus deformity. Her right knee shows a well placed prosthesis with no abnormalities.  Assessment & Plan Osteoarthritis, knee (715.96) Impression: Left Knee  Note: Plan is for a Left Total Knee Replacement. Risks and benefits of the surgery have been discussed with the patient and they elect to proceed with surgery.  There are on active contraindications to upcoming procedure such as ongoing infection or progressive neurological disease. Patient has been seen preoperatively by Dr. Juline Patch and has been cleared for surgery.

## 2011-09-29 NOTE — Anesthesia Preprocedure Evaluation (Addendum)
Anesthesia Evaluation  Patient identified by MRN, date of birth, ID band Patient awake    Reviewed: Allergy & Precautions, H&P , NPO status , Patient's Chart, lab work & pertinent test results  Airway Mallampati: II TM Distance: >3 FB Neck ROM: Full    Dental No notable dental hx.    Pulmonary neg pulmonary ROS,  clear to auscultation  Pulmonary exam normal       Cardiovascular neg cardio ROS Regular Normal    Neuro/Psych Negative Neurological ROS  Negative Psych ROS   GI/Hepatic negative GI ROS, Neg liver ROS,   Endo/Other  Negative Endocrine ROS  Renal/GU negative Renal ROS  Genitourinary negative   Musculoskeletal negative musculoskeletal ROS (+)   Abdominal   Peds negative pediatric ROS (+)  Hematology negative hematology ROS (+)   Anesthesia Other Findings   Reproductive/Obstetrics negative OB ROS                          Anesthesia Physical Anesthesia Plan  ASA: II  Anesthesia Plan: Spinal   Post-op Pain Management:    Induction:   Airway Management Planned:   Additional Equipment:   Intra-op Plan:   Post-operative Plan: Extubation in OR  Informed Consent: I have reviewed the patients History and Physical, chart, labs and discussed the procedure including the risks, benefits and alternatives for the proposed anesthesia with the patient or authorized representative who has indicated his/her understanding and acceptance.   Dental advisory given  Plan Discussed with: CRNA  Anesthesia Plan Comments:         Anesthesia Quick Evaluation

## 2011-09-29 NOTE — Interval H&P Note (Signed)
History and Physical Interval Note:   09/29/2011   11:13 AM   Christina Duarte  has presented today for surgery, with the diagnosis of osteoarthritis left knee  The various methods of treatment have been discussed with the patient and family. After consideration of risks, benefits and other options for treatment, the patient has consented to  Procedure(s): TOTAL KNEE ARTHROPLASTY as a surgical intervention .  The patients' history has been reviewed, patient examined, no change in status, stable for surgery.  I have reviewed the patients' chart and labs.  Questions were answered to the patient's satisfaction.     Loanne Drilling  MD

## 2011-09-30 DIAGNOSIS — M1712 Unilateral primary osteoarthritis, left knee: Secondary | ICD-10-CM | POA: Diagnosis present

## 2011-09-30 DIAGNOSIS — E871 Hypo-osmolality and hyponatremia: Secondary | ICD-10-CM | POA: Diagnosis not present

## 2011-09-30 LAB — BASIC METABOLIC PANEL
Chloride: 97 mEq/L (ref 96–112)
Creatinine, Ser: 0.52 mg/dL (ref 0.50–1.10)
GFR calc Af Amer: >90 mL/min (ref >90)
Potassium: 3.8 mEq/L (ref 3.5–5.1)

## 2011-09-30 LAB — CBC
MCV: 86.2 fL (ref 78.0–100.0)
Platelets: 259 10*3/uL (ref 150–400)
RDW: 14.3 % (ref 11.5–15.5)
WBC: 5.7 10*3/uL (ref 4.0–10.5)

## 2011-09-30 MED ORDER — MORPHINE SULFATE 2 MG/ML IJ SOLN: 1.0000 mg | INTRAMUSCULAR | Status: DC | PRN

## 2011-09-30 NOTE — Progress Notes (Signed)
Subjective: 1 Day Post-Op Procedure(s) (LRB): TOTAL KNEE ARTHROPLASTY (Left) Patient reports pain as 3 on 0-10 scale.   Patient seen in rounds with Dr. Lequita Halt. Patient has complaints of some knee pain but better that last night.  We will start therapy today. Plan is to go [facility] after hospital stay.  Objective: Vital signs in last 24 hours: Temp:  [96.8 F (36 C)-98.8 F (37.1 C)] 98.6 F (37 C) (11/27 0600) Pulse Rate:  [49-124] 79  (11/27 0600) Resp:  [12-24] 16  (11/27 0600) BP: (97-175)/(62-96) 138/82 mmHg (11/27 0600) SpO2:  [81 %-100 %] 99 % (11/27 0600) FiO2 (%):  [100 %] 100 % (11/26 1405) Weight:  [96.616 kg (213 lb)] 213 lb (96.616 kg) (11/26 1525)  Intake/Output from previous day:  Intake/Output Summary (Last 24 hours) at 09/30/11 0824 Last data filed at 09/30/11 0600  Gross per 24 hour  Intake 4772.5 ml  Output   1685 ml  Net 3087.5 ml    Intake/Output this shift:    Labs: Results for orders placed during the hospital encounter of 09/29/11  TYPE AND SCREEN      Component Value Range   ABO/RH(D) O NEG     Antibody Screen NEG     Sample Expiration 10/02/2011    ABO/RH      Component Value Range   ABO/RH(D) O NEG    CBC      Component Value Range   WBC 5.7  4.0 - 10.5 (K/uL)   RBC 3.99  3.87 - 5.11 (MIL/uL)   Hemoglobin 10.9 (*) 12.0 - 15.0 (g/dL)   HCT 45.4 (*) 09.8 - 46.0 (%)   MCV 86.2  78.0 - 100.0 (fL)   MCH 27.3  26.0 - 34.0 (pg)   MCHC 31.7  30.0 - 36.0 (g/dL)   RDW 11.9  14.7 - 82.9 (%)   Platelets 259  150 - 400 (K/uL)  BASIC METABOLIC PANEL      Component Value Range   Sodium 133 (*) 135 - 145 (mEq/L)   Potassium 3.8  3.5 - 5.1 (mEq/L)   Chloride 97  96 - 112 (mEq/L)   CO2 29  19 - 32 (mEq/L)   Glucose, Bld 143 (*) 70 - 99 (mg/dL)   BUN 10  6 - 23 (mg/dL)   Creatinine, Ser 5.62  0.50 - 1.10 (mg/dL)   Calcium 9.1  8.4 - 13.0 (mg/dL)   GFR calc non Af Amer >90  >90 (mL/min)   GFR calc Af Amer >90  >90 (mL/min)    Exam -  Neurovascular intact Sensation intact distally Dressing - clean, dry Motor function intact - moving foot and toes well on exam.  Hemovac pulled without difficulty.  Assessment/Plan: 1 Day Post-Op Procedure(s) (LRB): TOTAL KNEE ARTHROPLASTY (Left) Post op hyponatremia  Past Medical History  Diagnosis Date  . Blood transfusion   . PVC's (premature ventricular contractions)     HX OF HEART PALPITATIONS AND PVC'S  . PVC's (premature ventricular contractions)     PT STATES IF SHE NEEDS HEART DOCTOR WHILE IN HOSP-SHE WANTS DR. PANG CALLED-HER MEDICAL DOCTOR AND DR. Allyson Sabal  . 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 No vaccines. D/C Morphine PCA, Change to IV push D/C O2  and Pulse OX and try on Room [address]  Christina Duarte 09/30/2011, 8:24 AM

## 2011-09-30 NOTE — Progress Notes (Signed)
CSW assisting with D/C planning to SNF. [facility] has accepted pt for ST rehab. According to SNF, pt's [employer] does not cover SNF placement. SNF will bill pt's medicare insurance once they receive a denial letter from [facility]. This should not affect d/c planning to SNF. Will follow.

## 2011-09-30 NOTE — Progress Notes (Signed)
FL2 completed and in shadow chart for MD signature.

## 2011-09-30 NOTE — Progress Notes (Signed)
Physical Therapy Evaluation Patient Details Name: Christina Duarte MRN: [id number] DOB: 01-20-1946 Today's Date: 09/30/2011 11:12-11:46, EV2  Problem List:  Patient Active Problem List  Diagnoses  . Hyponatremia    Past Medical History:  Past Medical History  Diagnosis Date  . Blood transfusion   . PVC's (premature ventricular contractions)     HX OF HEART PALPITATIONS AND PVC'S  . PVC's (premature ventricular contractions)     PT STATES IF SHE NEEDS HEART DOCTOR WHILE IN HOSP-SHE WANTS DR. PANG CALLED-HER MEDICAL DOCTOR AND DR. Allyson Sabal  . 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 Tonsillectomy 1964  . Tubal ligation 1981  . Joint replacement 2007     RIGHT KNEE  . Foot surgery 2012    RIGHT -BUNIONECTOMY    PT Assessment/Plan/Recommendation PT Assessment Clinical Impression Statement: Pt s/p L TKA and plans on going to [facility] for rehab.  Pt did well with PT eval and should progress well in acute care for d/c to SNF for short term rehab. PT Recommendation/Assessment: Patient will need skilled PT in the acute care venue PT Problem List: Decreased strength;Decreased range of motion;Decreased mobility;Decreased knowledge of use of DME PT Therapy Diagnosis : Difficulty walking PT Plan PT Frequency: 7X/week PT Treatment/Interventions: Gait training;DME instruction;Functional mobility training;Therapeutic exercise PT Recommendation Follow Up Recommendations: Skilled nursing facility Equipment Recommended: None recommended by PT PT Goals  Acute Rehab PT Goals PT Goal Formulation: With patient Time For Goal Achievement: 7 days Pt will go Supine/Side to Sit: with supervision PT Goal: Supine/Side to Sit - Progress: Progressing toward goal Pt will Transfer Sit to Stand/Stand to Sit: with supervision PT Transfer Goal: Sit to Stand/Stand to Sit - Progress:  Progressing toward goal Pt will Ambulate: 51 - 150 feet;with least restrictive assistive device;with supervision PT Goal: Ambulate - Progress: Progressing toward goal Pt will Perform Home Exercise Program: with supervision, verbal cues required/provided PT Goal: Perform Home Exercise Program - Progress: Progressing toward goal  PT Evaluation Precautions/Restrictions  Restrictions Weight Bearing Restrictions: Yes LLE Weight Bearing: Weight bearing as tolerated Prior Functioning  Home Living Lives With: Family (mother) Type of Home: House Home Adaptive Equipment: Walker - rolling Additional Comments: Pt plans on going to [facility] for rehab. Prior Function Level of Independence: Independent with homemaking with ambulation Vocation: Retired (retired from [employer] last week) Cognition Cognition Arousal/Alertness: Awake/alert Overall Cognitive Status: Appears within functional limits for tasks assessed Orientation Level: Oriented X4 Sensation/Coordination Coordination Gross Motor Movements are Fluid and Coordinated: Yes Extremity Assessment LLE AROM (degrees) Overall AROM Left Lower Extremity: Deficits;Other (Comment) (due to L TKA) LLE Strength LLE Overall Strength: Deficits;Other (Comment) (due to L TKA) Mobility (including Balance) Transfers Transfers: Yes Sit to Stand: 4: Min assist Stand to Sit: 4: Min assist Ambulation/Gait Ambulation/Gait: Yes Ambulation/Gait Assistance: 4: Min assist Ambulation/Gait Assistance Details (indicate cue type and reason): cues for sequencing. Ambulation Distance (Feet): 30 Feet Assistive device: Rolling walker Gait Pattern: Step-to pattern    Exercise  Total Joint Exercises Ankle Circles/Pumps: AROM;Both;10 reps;Supine Quad Sets: AROM;Strengthening;Left;10 reps;Supine Heel Slides: AAROM;Left;10 reps;Supine Straight Leg Raises: AAROM;Left;10 reps End of Session PT - End of Session Equipment Utilized During Treatment: Gait belt Activity  Tolerance: Patient tolerated treatment well Patient left: in chair;with call bell in reach General Behavior During Session: [facility] for tasks performed Cognition: [facility] for tasks performed  [facility] LUBECK 09/30/2011,  12:22 PM

## 2011-09-30 NOTE — Progress Notes (Signed)
Physical Therapy Treatment Patient Details Name: Christina Duarte MRN: [id number] DOB: 12/11/1945 Today's Date: 09/30/2011 13:12-13:33, gt PT Assessment/Plan  PT - Assessment/Plan Comments on Treatment Session: Pt with increased pain in PM session and unable to walk as far as she did in the AM. PT Plan: Discharge plan remains appropriate;Frequency remains appropriate PT Frequency: 7X/week Follow Up Recommendations: Skilled nursing facility Equipment Recommended: None recommended by PT PT Goals  Acute Rehab PT Goals PT Goal Formulation: With patient Time For Goal Achievement: 7 days Pt will go Supine/Side to Sit: with supervision PT Goal: Supine/Side to Sit - Progress: Progressing toward goal Pt will Transfer Sit to Stand/Stand to Sit: with supervision PT Transfer Goal: Sit to Stand/Stand to Sit - Progress: Progressing toward goal Pt will Ambulate: 51 - 150 feet;with least restrictive assistive device;with supervision PT Goal: Ambulate - Progress: Progressing toward goal Pt will Perform Home Exercise Program: with supervision, verbal cues required/provided PT Goal: Perform Home Exercise Program - Progress: Progressing toward goal  PT Treatment Precautions/Restrictions  Restrictions Weight Bearing Restrictions: Yes LLE Weight Bearing: Weight bearing as tolerated Mobility (including Balance)  Sit to Supine - Right: 4: Min assist Sit to Supine - Right Details (indicate cue type and reason): A for L LE Transfers Transfers: Yes Sit to Stand: 4: Min assist Sit to Stand Details (indicate cue type and reason): cues for proper hand placement Stand to Sit: 4: Min assist Stand to Sit Details: cues to control descent and to kick L LE out. Ambulation/Gait Ambulation/Gait: Yes Ambulation/Gait Assistance: 4: Min assist Ambulation/Gait Assistance Details (indicate cue type and reason): good recall of sequencing Ambulation Distance (Feet): 25 Feet Assistive device: Rolling walker Gait  Pattern: Step-to pattern   End of Session PT - End of Session Equipment Utilized During Treatment: Gait belt Activity Tolerance: Patient tolerated treatment well Patient left: in bed;with call bell in reach Nurse Communication: Mobility status for transfers;Mobility status for ambulation;Other (comment) (pain level) General Behavior During Session: [facility] for tasks performed Cognition: [facility] for tasks performed  [facility] LUBECK 09/30/2011, 1:47 PM

## 2011-10-01 ENCOUNTER — Encounter (HOSPITAL_COMMUNITY): Payer: Self-pay | Admitting: Orthopedic Surgery

## 2011-10-01 LAB — BASIC METABOLIC PANEL
BUN: 7 mg/dL (ref 6–23)
Chloride: 97 mEq/L (ref 96–112)
GFR calc Af Amer: >90 mL/min (ref >90)
Potassium: 3.6 mEq/L (ref 3.5–5.1)

## 2011-10-01 LAB — CBC
HCT: 30.9 % — ABNORMAL LOW (ref 36.0–46.0)
RDW: 14.2 % (ref 11.5–15.5)
WBC: 7 10*3/uL (ref 4.0–10.5)

## 2011-10-01 NOTE — Progress Notes (Signed)
OT Note Order received, chart reviewed. Spoke briefly w/ pt who will be [facility] upon d/c. Pt had previous knee sx & is aware of the role of OT in the acute & rehab setting. Pt presents w/no OT needs in acute. Will sign off.

## 2011-10-01 NOTE — Progress Notes (Signed)
Physical Therapy Treatment Patient Details Name: Christina Duarte MRN: [id number] DOB: May 09, 1946 Today's Date: 10/01/2011 4098-1191 1te PT Assessment/Plan  PT - Assessment/Plan Comments on Treatment Session: doing well, pt wished to defer amb secondary to pain PT Plan: Discharge plan remains appropriate;Frequency remains appropriate PT Frequency: 7X/week Follow Up Recommendations: Skilled nursing facility Equipment Recommended: Defer to next venue PT Goals  Acute Rehab PT Goals PT Goal: Perform Home Exercise Program - Progress: Progressing toward goal  PT Treatment Precautions/Restrictions  Precautions Precautions: Knee Precaution Comments: no pillow under knee Required Braces or Orthoses: Yes Knee Immobilizer: Discontinue once straight leg raise with < 10 degree lag Restrictions Weight Bearing Restrictions: No LLE Weight Bearing: Weight bearing as tolerated Mobility (including Balance)      Exercise  Total Joint Exercises Ankle Circles/Pumps: AROM;Both;10 reps;Supine Quad Sets: AROM;Strengthening;10 reps;Supine;Both Towel Squeeze: AROM;Both;10 reps;Supine Heel Slides: AAROM;Left;10 reps;Supine Straight Leg Raises: AAROM;10 reps;Supine;Left End of Session PT - End of Session Activity Tolerance: Patient limited by pain;Patient limited by fatigue Patient left: in bed;with call bell in reach General Behavior During Session: [facility] for tasks performed Cognition: [facility] for tasks performed  [facility] 10/01/2011, 4:05 PM

## 2011-10-01 NOTE — Progress Notes (Signed)
Foley d/c'd 10/01/11 at 0625.

## 2011-10-01 NOTE — Progress Notes (Signed)
Subjective: 2 Days Post-Op Procedure(s) (LRB): TOTAL KNEE ARTHROPLASTY (Left) Patient reports pain as mild.   Patient seen in rounds with Dr. Lequita Halt. Patient has complaints of drowsiness likely associated with pain meds. Pt states that today is the first day she has had an appetite. Pt walked 60 feet in PT today.   Objective: Vital signs in last 24 hours: Temp:  [98.7 F (37.1 C)-99.7 F (37.6 C)] 98.7 F (37.1 C) (11/28 1420) Pulse Rate:  [80-104] 104  (11/28 1420) Resp:  [12-20] 12  (11/28 1420) BP: (123-152)/(71-85) 123/71 mmHg (11/28 1420) SpO2:  [91 %-99 %] 94 % (11/28 1420)  Intake/Output from previous day:  Intake/Output Summary (Last 24 hours) at 10/01/11 1507 Last data filed at 10/01/11 1400  Gross per 24 hour  Intake 1741.69 ml  Output   2615 ml  Net -873.31 ml    Intake/Output this shift: Total I/O In: 600 [P.O.:600] Out: -   Labs: Results for orders placed during the hospital encounter of 09/29/11  TYPE AND SCREEN      Component Value Range   ABO/RH(D) O NEG     Antibody Screen NEG     Sample Expiration 10/02/2011    ABO/RH      Component Value Range   ABO/RH(D) O NEG    CBC      Component Value Range   WBC 5.7  4.0 - 10.5 (K/uL)   RBC 3.99  3.87 - 5.11 (MIL/uL)   Hemoglobin 10.9 (*) 12.0 - 15.0 (g/dL)   HCT 04.5 (*) 40.9 - 46.0 (%)   MCV 86.2  78.0 - 100.0 (fL)   MCH 27.3  26.0 - 34.0 (pg)   MCHC 31.7  30.0 - 36.0 (g/dL)   RDW 81.1  91.4 - 78.2 (%)   Platelets 259  150 - 400 (K/uL)  BASIC METABOLIC PANEL      Component Value Range   Sodium 133 (*) 135 - 145 (mEq/L)   Potassium 3.8  3.5 - 5.1 (mEq/L)   Chloride 97  96 - 112 (mEq/L)   CO2 29  19 - 32 (mEq/L)   Glucose, Bld 143 (*) 70 - 99 (mg/dL)   BUN 10  6 - 23 (mg/dL)   Creatinine, Ser 9.56  0.50 - 1.10 (mg/dL)   Calcium 9.1  8.4 - 21.3 (mg/dL)   GFR calc non Af Amer >90  >90 (mL/min)   GFR calc Af Amer >90  >90 (mL/min)  CBC      Component Value Range   WBC 7.0  4.0 - 10.5 (K/uL)   RBC 3.66 (*) 3.87 - 5.11 (MIL/uL)   Hemoglobin 10.5 (*) 12.0 - 15.0 (g/dL)   HCT 08.6 (*) 57.8 - 46.0 (%)   MCV 84.4  78.0 - 100.0 (fL)   MCH 28.7  26.0 - 34.0 (pg)   MCHC 34.0  30.0 - 36.0 (g/dL)   RDW 46.9  62.9 - 52.8 (%)   Platelets 244  150 - 400 (K/uL)  BASIC METABOLIC PANEL      Component Value Range   Sodium 132 (*) 135 - 145 (mEq/L)   Potassium 3.6  3.5 - 5.1 (mEq/L)   Chloride 97  96 - 112 (mEq/L)   CO2 27  19 - 32 (mEq/L)   Glucose, Bld 111 (*) 70 - 99 (mg/dL)   BUN 7  6 - 23 (mg/dL)   Creatinine, Ser 4.13  0.50 - 1.10 (mg/dL)   Calcium 9.1  8.4 - 24.4 (mg/dL)  GFR calc non Af Amer >90  >90 (mL/min)   GFR calc Af Amer >90  >90 (mL/min)    Exam - Neurovascular intact Sensation intact distally dressing C/D/I and no drainage Dressing/Incision - clean, dry, no drainage Motor function intact - moving foot and toes well on exam.   Assessment/Plan: 2 Days Post-Op Procedure(s) (LRB): TOTAL KNEE ARTHROPLASTY (Left)  Past Medical History  Diagnosis Date  . Blood transfusion   . PVC's (premature ventricular contractions)     HX OF HEART PALPITATIONS AND PVC'S  . PVC's (premature ventricular contractions)     PT STATES IF SHE NEEDS HEART DOCTOR WHILE IN HOSP-SHE WANTS DR. PANG CALLED-HER MEDICAL DOCTOR AND DR. Allyson Sabal  . 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

## 2011-10-01 NOTE — Progress Notes (Signed)
Physical Therapy Treatment Patient Details Name: Christina Duarte MRN: [id number] DOB: [birth year] Today's Date: 10/01/2011 7253-6644 2gt PT Assessment/Plan  PT - Assessment/Plan Comments on Treatment Session: pt progressing well limited by pain, will benefit from SNF; pain 7/10; RN notified and gave meds after amb. PT Plan: Discharge plan remains appropriate;Frequency remains appropriate PT Frequency: 7X/week Follow Up Recommendations: Skilled nursing facility Equipment Recommended: Defer to next venue PT Goals  Acute Rehab PT Goals PT Transfer Goal: Sit to Stand/Stand to Sit - Progress: Progressing toward goal PT Goal: Ambulate - Progress: Progressing toward goal  PT Treatment Precautions/Restrictions  Precautions Precautions: Knee Precaution Comments: no pillow under knee Required Braces or Orthoses: Yes Knee Immobilizer: Discontinue once straight leg raise with < 10 degree lag Restrictions Weight Bearing Restrictions: No LLE Weight Bearing: Weight bearing as tolerated Mobility (including Balance) Transfers Sit to Stand: 3: Mod assist;From chair/3-in-1;With upper extremity assist;With armrests Sit to Stand Details (indicate cue type and reason): cues for wt shift, hand placement, LE position Stand to Sit: 3: Mod assist;To chair/3-in-1;With upper extremity assist;With armrests Stand to Sit Details: cues for hand and LE position Ambulation/Gait Ambulation/Gait Assistance: 4: Min assist Ambulation/Gait Assistance Details (indicate cue type and reason): cues for sequence, posture Ambulation Distance (Feet): 60 Feet Assistive device: Rolling walker Gait Pattern: Step-to pattern    Exercise    End of Session PT - End of Session Equipment Utilized During Treatment: Gait belt Activity Tolerance: Patient limited by pain Patient left: in chair;with call bell in reach General Behavior During Session: [facility] for tasks performed Cognition: [facility] for tasks  performed  [facility] 10/01/2011, 10:49 AM

## 2011-10-02 LAB — BASIC METABOLIC PANEL
BUN: 7 mg/dL (ref 6–23)
CO2: 30 mEq/L (ref 19–32)
Chloride: 95 mEq/L — ABNORMAL LOW (ref 96–112)
Glucose, Bld: 118 mg/dL — ABNORMAL HIGH (ref 70–99)
Potassium: 3.4 mEq/L — ABNORMAL LOW (ref 3.5–5.1)

## 2011-10-02 LAB — CBC
HCT: 29.8 % — ABNORMAL LOW (ref 36.0–46.0)
Hemoglobin: 9.7 g/dL — ABNORMAL LOW (ref 12.0–15.0)
WBC: 7.9 10*3/uL (ref 4.0–10.5)

## 2011-10-02 MED ORDER — OXYCODONE HCL 5 MG PO TABS: 5.0000 mg | ORAL_TABLET | ORAL | Status: AC | PRN

## 2011-10-02 MED ORDER — DSS 100 MG PO CAPS: 100.0000 mg | ORAL_CAPSULE | Freq: Two times a day (BID) | ORAL | Status: AC

## 2011-10-02 MED ORDER — POTASSIUM CHLORIDE CRYS ER 20 MEQ PO TBCR
40.0000 meq | EXTENDED_RELEASE_TABLET | Freq: Once | ORAL | Status: AC
  Administered 2011-10-02: 40 meq via ORAL
  Filled 2011-10-02: qty 2

## 2011-10-02 MED ORDER — MAGNESIUM HYDROXIDE 400 MG/5ML PO SUSP: 30.0000 mL | Freq: Two times a day (BID) | ORAL | Status: AC | PRN

## 2011-10-02 MED ORDER — BISACODYL 5 MG PO TBEC: 10.0000 mg | DELAYED_RELEASE_TABLET | Freq: Every day | ORAL | Status: AC | PRN

## 2011-10-02 MED ORDER — POLYETHYLENE GLYCOL 3350 17 G PO PACK: 17.0000 g | PACK | Freq: Every day | ORAL | Status: AC | PRN

## 2011-10-02 MED ORDER — POTASSIUM CHLORIDE ER 10 MEQ PO TBCR: 40.0000 meq | EXTENDED_RELEASE_TABLET | Freq: Once | ORAL | Status: DC

## 2011-10-02 MED ORDER — RIVAROXABAN 10 MG PO TABS: 10.0000 mg | ORAL_TABLET | ORAL | Status: DC

## 2011-10-02 MED ORDER — ACETAMINOPHEN 325 MG PO TABS: 650.0000 mg | ORAL_TABLET | Freq: Four times a day (QID) | ORAL | Status: AC | PRN

## 2011-10-02 MED ORDER — METHOCARBAMOL 500 MG PO TABS: 500.0000 mg | ORAL_TABLET | Freq: Four times a day (QID) | ORAL | Status: AC | PRN

## 2011-10-02 MED ORDER — ONDANSETRON HCL 4 MG PO TABS: 4.0000 mg | ORAL_TABLET | Freq: Four times a day (QID) | ORAL | Status: AC | PRN

## 2011-10-02 NOTE — Progress Notes (Signed)
Physical Therapy Treatment Patient Details Name: Christina Duarte MRN: [id number] DOB: 05-Feb-1946 Today's Date: 10/02/2011 1145-1200 1gt PT Assessment/Plan  PT - Assessment/Plan Comments on Treatment Session: pain meds needed, rn notified PT Plan: Discharge plan remains appropriate;Frequency remains appropriate Follow Up Recommendations: Skilled nursing facility Equipment Recommended: Defer to next venue PT Goals  Acute Rehab PT Goals PT Goal: Supine/Side to Sit - Progress: Progressing toward goal PT Transfer Goal: Sit to Stand/Stand to Sit - Progress: Progressing toward goal PT Goal: Ambulate - Progress: Progressing toward goal PT Goal: Perform Home Exercise Program - Progress: Progressing toward goal  PT Treatment Precautions/Restrictions  Precautions Precautions: Knee Precaution Comments: no pillow under knee Required Braces or Orthoses: Yes Knee Immobilizer: Discontinue once straight leg raise with < 10 degree lag Restrictions Weight Bearing Restrictions: No LLE Weight Bearing: Weight bearing as tolerated Mobility (including Balance) Bed Mobility Supine to Sit: 4: Min assist;HOB flat Supine to Sit Details (indicate cue type and reason): cues for technique Transfers Sit to Stand: 4: Min assist;From elevated surface;From bed Sit to Stand Details (indicate cue type and reason): cues for hands and LE position Stand to Sit: 4: Min assist;To chair/3-in-1;With armrests Stand to Sit Details: cues to control descent and to kick L LE out. Ambulation/Gait Ambulation/Gait Assistance: 4: Min assist;5: Supervision Ambulation/Gait Assistance Details (indicate cue type and reason): cues for sequencing Ambulation Distance (Feet): 90 Feet Assistive device: Rolling walker Gait Pattern: Step-to pattern    Exercise  Total Joint Exercises Ankle Circles/Pumps: AROM;Both;10 reps;Supine Quad Sets: AROM;Strengthening;10 reps;Supine;Both Heel Slides: AAROM;Left;10 reps;Supine End of  Session PT - End of Session Activity Tolerance: Patient tolerated treatment well Patient left: in chair;with call bell in reach Nurse Communication:  (ready to d/c to snf) General Behavior During Session: [facility] for tasks performed Cognition: [facility] for tasks performed  [facility] 10/02/2011, 12:34 PM

## 2011-10-02 NOTE — Discharge Summary (Signed)
Physician Discharge Summary   Patient ID: Christina Duarte MRN: [id number] DOB/AGE: Mar 07, 1946 65 y.o.  Admit date: 09/29/2011 Discharge date: 10/02/2011  Primary Diagnosis: Osteoarthritis Left knee  Admission Diagnoses: Past Medical History  Diagnosis Date  . Blood transfusion   . PVC's (premature ventricular contractions)     HX OF HEART PALPITATIONS AND PVC'S  . PVC's (premature ventricular contractions)     PT STATES IF SHE NEEDS HEART DOCTOR WHILE IN HOSP-SHE WANTS DR. PANG CALLED-HER MEDICAL DOCTOR AND DR. Allyson Sabal  . Arthritis     "ALL OVER"  --OA LEFT KNEE-PLANS KNEE REPLACEMENT--S/P RT KNEE REPLACEMENT    Discharge Diagnoses:  Principal Problem:  *Osteoarthritis of left knee Active Problems:  Hyponatremia Mild Postop ABLA Mild Hypokalemia  Procedure: Procedure(s) (LRB): TOTAL KNEE ARTHROPLASTY (Left)   Consults: none  HPI:  Christina Duarte is a 65 y.o. year old female with end stage OA of her left knee with progressively worsening pain and dysfunction. She has constant pain, with activity and at rest and significant functional deficits with difficulties even with ADLs. She has had extensive non-op management including analgesics, injections of cortisone and viscosupplements, and home exercise program, but remains in significant pain with significant dysfunction. Radiographs show bone on bone arthritis with significant varus deformity. She presents now for left Total Knee Arthroplasty.   Laboratory Data: Results for orders placed during the hospital encounter of 09/29/11  TYPE AND SCREEN      Component Value Range   ABO/RH(D) O NEG     Antibody Screen NEG     Sample Expiration 10/02/2011    ABO/RH      Component Value Range   ABO/RH(D) O NEG    CBC      Component Value Range   WBC 5.7  4.0 - 10.5 (K/uL)   RBC 3.99  3.87 - 5.11 (MIL/uL)   Hemoglobin 10.9 (*) 12.0 - 15.0 (g/dL)   HCT 40.9 (*) 81.1 - 46.0 (%)   MCV 86.2  78.0 - 100.0 (fL)   MCH 27.3  26.0 -  34.0 (pg)   MCHC 31.7  30.0 - 36.0 (g/dL)   RDW 91.4  78.2 - 95.6 (%)   Platelets 259  150 - 400 (K/uL)  BASIC METABOLIC PANEL      Component Value Range   Sodium 133 (*) 135 - 145 (mEq/L)   Potassium 3.8  3.5 - 5.1 (mEq/L)   Chloride 97  96 - 112 (mEq/L)   CO2 29  19 - 32 (mEq/L)   Glucose, Bld 143 (*) 70 - 99 (mg/dL)   BUN 10  6 - 23 (mg/dL)   Creatinine, Ser 2.13  0.50 - 1.10 (mg/dL)   Calcium 9.1  8.4 - 08.6 (mg/dL)   GFR calc non Af Amer >90  >90 (mL/min)   GFR calc Af Amer >90  >90 (mL/min)  CBC      Component Value Range   WBC 7.0  4.0 - 10.5 (K/uL)   RBC 3.66 (*) 3.87 - 5.11 (MIL/uL)   Hemoglobin 10.5 (*) 12.0 - 15.0 (g/dL)   HCT 57.8 (*) 46.9 - 46.0 (%)   MCV 84.4  78.0 - 100.0 (fL)   MCH 28.7  26.0 - 34.0 (pg)   MCHC 34.0  30.0 - 36.0 (g/dL)   RDW 62.9  52.8 - 41.3 (%)   Platelets 244  150 - 400 (K/uL)  BASIC METABOLIC PANEL      Component Value Range   Sodium 132 (*) 135 -  145 (mEq/L)   Potassium 3.6  3.5 - 5.1 (mEq/L)   Chloride 97  96 - 112 (mEq/L)   CO2 27  19 - 32 (mEq/L)   Glucose, Bld 111 (*) 70 - 99 (mg/dL)   BUN 7  6 - 23 (mg/dL)   Creatinine, Ser 1.61  0.50 - 1.10 (mg/dL)   Calcium 9.1  8.4 - 09.6 (mg/dL)   GFR calc non Af Amer >90  >90 (mL/min)   GFR calc Af Amer >90  >90 (mL/min)  CBC      Component Value Range   WBC 7.9  4.0 - 10.5 (K/uL)   RBC 3.52 (*) 3.87 - 5.11 (MIL/uL)   Hemoglobin 9.7 (*) 12.0 - 15.0 (g/dL)   HCT 04.5 (*) 40.9 - 46.0 (%)   MCV 84.7  78.0 - 100.0 (fL)   MCH 27.6  26.0 - 34.0 (pg)   MCHC 32.6  30.0 - 36.0 (g/dL)   RDW 81.1  91.4 - 78.2 (%)   Platelets 256  150 - 400 (K/uL)  BASIC METABOLIC PANEL      Component Value Range   Sodium 133 (*) 135 - 145 (mEq/L)   Potassium 3.4 (*) 3.5 - 5.1 (mEq/L)   Chloride 95 (*) 96 - 112 (mEq/L)   CO2 30  19 - 32 (mEq/L)   Glucose, Bld 118 (*) 70 - 99 (mg/dL)   BUN 7  6 - 23 (mg/dL)   Creatinine, Ser 9.56  0.50 - 1.10 (mg/dL)   Calcium 9.2  8.4 - 21.3 (mg/dL)   GFR calc non Af Amer  >90  >90 (mL/min)   GFR calc Af Amer >90  >90 (mL/min)    Hospital Outpatient Visit on 09/18/2011  Component Date Value Range Status  . MRSA, PCR  09/18/2011 POSITIVE* NEGATIVE Final   Comment: RESULT CALLED TO, READ BACK BY AND VERIFIED WITH:                          C HARNDEN AT 1855 ON 11,15,2012 BY NRBOOKS  . Staphylococcus aureus  09/18/2011 POSITIVE* NEGATIVE Final   Comment:                                 The Xpert SA Assay (FDA                          approved for NASAL specimens                          only), is one component of                          a comprehensive surveillance                          program.  It is not intended                          to diagnose infection nor to                          guide or monitor treatment.  RESULT CALLED TO, READ BACK BY AND VERIFIED WITH:                          C HARNDEN AT 1855 ON 11.15.2012 BY NBROOKS  . aPTT (seconds) 09/18/2011 40* 24-37 Final   Comment:                                 IF BASELINE aPTT IS ELEVATED,                          SUGGEST PATIENT RISK ASSESSMENT                          BE USED TO DETERMINE APPROPRIATE                          ANTICOAGULANT THERAPY.  . WBC (K/uL) 09/18/2011 5.4  4.0-10.5 Final  . RBC (MIL/uL) 09/18/2011 4.89  3.87-5.11 Final  . Hemoglobin (g/dL) 14/78/2956 21.3  08.6-57.8 Final  . HCT (%) 09/18/2011 41.4  36.0-46.0 Final  . MCV (fL) 09/18/2011 84.7  78.0-100.0 Final  . MCH (pg) 09/18/2011 27.8  26.0-34.0 Final  . MCHC (g/dL) 46/96/2952 84.1  32.4-40.1 Final  . RDW (%) 09/18/2011 14.6  11.5-15.5 Final  . Platelets (K/uL) 09/18/2011 295  150-400 Final  . Sodium (mEq/L) 09/18/2011 136  135-145 Final  . Potassium (mEq/L) 09/18/2011 3.9  3.5-5.1 Final  . Chloride (mEq/L) 09/18/2011 101  96-112 Final  . CO2 (mEq/L) 09/18/2011 28  19-32 Final  . Glucose, Bld (mg/dL) 02/72/5366 83  44-03 Final  . BUN (mg/dL) 47/42/5956 21  3-87 Final  . Creatinine,  Ser (mg/dL) 56/43/3295 1.88  4.16-6.06 Final  . Calcium (mg/dL) 30/16/0109 9.9  3.2-35.5 Final  . Total Protein (g/dL) 73/22/0254 7.3  2.7-0.6 Final  . Albumin (g/dL) 23/76/2831 4.1  5.1-7.6 Final  . AST (U/L) 09/18/2011 26  0-37 Final  . ALT (U/L) 09/18/2011 25  0-35 Final  . Alkaline Phosphatase (U/L) 09/18/2011 109  39-117 Final  . Total Bilirubin (mg/dL) 16/05/3709 0.5  6.2-6.9 Final  . GFR calc non Af Amer (mL/min) 09/18/2011 88* >90 Final  . GFR calc Af Amer (mL/min) 09/18/2011 >90  >90 Final   Comment:                                 The eGFR has been calculated                          using the CKD EPI equation.                          This calculation has not been                          validated in all clinical                          situations.                          eGFR's persistently                          <  90 mL/min signify                          possible Chronic Kidney Disease.  Marland Kitchen Prothrombin Time (seconds) 09/18/2011 13.2  11.6-15.2 Final  . INR  09/18/2011 0.98  0.00-1.49 Final  . Color, Urine  09/18/2011 YELLOW  YELLOW Final  . APPearance  09/18/2011 CLEAR  CLEAR Final  . Specific Gravity, Urine  09/18/2011 1.015  1.005-1.030 Final  . pH  09/18/2011 6.5  5.0-8.0 Final  . Glucose, UA (mg/dL) 14/78/2956 NEGATIVE  NEGATIVE Final  . Hgb urine dipstick  09/18/2011 NEGATIVE  NEGATIVE Final  . Bilirubin Urine  09/18/2011 NEGATIVE  NEGATIVE Final  . Ketones, ur (mg/dL) 21/30/8657 NEGATIVE  NEGATIVE Final  . Protein, ur (mg/dL) 84/69/6295 NEGATIVE  NEGATIVE Final  . Urobilinogen, UA (mg/dL) 28/41/3244 0.2  0.1-0.2 Final  . Nitrite  09/18/2011 NEGATIVE  NEGATIVE Final  . Leukocytes, UA  09/18/2011 NEGATIVE  NEGATIVE Final   MICROSCOPIC NOT DONE ON URINES WITH NEGATIVE PROTEIN, BLOOD, LEUKOCYTES, NITRITE, OR GLUCOSE <1000 mg/dL.    X-Rays:Dg Chest 2 View  09/18/2011  *RADIOLOGY REPORT*  Clinical Data: Preop left total knee arthroplasty, history of  palpitations  CHEST - 2 VIEW  Comparison: CT chest dated 05/14/2006  Findings: Lungs are clear. No pleural effusion or pneumothorax.  Heart is top normal in size.  Mild degenerative changes of the visualized thoracolumbar spine.  IMPRESSION: No evidence of acute cardiopulmonary disease.  Original Report Authenticated By: Charline Bills, M.D.    EKG:No orders found for this or any previous visit.   Hospital Course: Patient was admitted to [facility] and taken to the OR and underwent the above state procedure without complications.  Patient tolerated the procedure well and was later transferred to the recovery room and then to the orthopaedic floor for postoperative care.  They were given PO and IV analgesics for pain control following their surgery.  They were given 24 hours of postoperative antibiotics and started on DVT prophylaxis.   PT and OT were ordered for total joint protocol.  Discharge planning consulted to help with postop disposition and equipment needs.  Patient had a rough night on the evening of surgery but feeling better the next morning and started to get up with therapy on day one.  PCA was discontinued and they were weaned over to PO meds.  Hemovac drain was pulled without difficulty.  Continued to progress with therapy into day two and walked over 60 feet.  Dressing was changed on day two and the incision was healing well.  By day three, the patient had progressed with therapy and meeting goals.  Incision was healing well. Social worker was ordered postop since patient wanted to look into SNF for rehab.  Bed found at [facility] and was transferred over today.   Discharge Medications: Prior to Admission medications   Medication Sig Start Date End Date Taking? Authorizing Provider  atorvastatin (LIPITOR) 40 MG tablet Take 40 mg by mouth daily.    Yes Historical Provider, MD  metoprolol (TOPROL-XL) 50 MG 24 hr tablet Take 50 mg by mouth daily.    Yes Historical Provider, MD    acetaminophen (TYLENOL) 325 MG tablet Take 2 tablets (650 mg total) by mouth every 6 (six) hours as needed (or Fever >/= 101). 10/02/11 10/12/11  Alexzandrew Perkins, PA  bisacodyl (DULCOLAX) 5 MG EC tablet Take 2 tablets (10 mg total) by mouth daily as needed for  constipation. 10/02/11 11/01/11  Alexzandrew Perkins, PA  docusate sodium 100 MG CAPS Take 100 mg by mouth 2 (two) times daily. 10/02/11 10/12/11  Alexzandrew Perkins, PA  magnesium hydroxide (MILK OF MAGNESIA) 400 MG/5ML suspension Take 30 mLs by mouth every 12 (twelve) hours as needed for constipation. 10/02/11 10/12/11  Alexzandrew Perkins, PA  methocarbamol (ROBAXIN) 500 MG tablet Take 1 tablet (500 mg total) by mouth every 6 (six) hours as needed. 10/02/11 10/12/11  Alexzandrew Perkins, PA  ondansetron (ZOFRAN) 4 MG tablet Take 1 tablet (4 mg total) by mouth every 6 (six) hours as needed for nausea. 10/02/11 10/09/11  Alexzandrew Perkins, PA  oxyCODONE (OXY IR/ROXICODONE) 5 MG immediate release tablet Take 1-2 tablets (5-10 mg total) by mouth every 3 (three) hours as needed. 10/02/11 10/12/11  Alexzandrew Perkins, PA  polyethylene glycol (MIRALAX / GLYCOLAX) packet Take 17 g by mouth daily as needed. 10/02/11 10/05/11  Alexzandrew Perkins, PA  potassium chloride (K-DUR) 10 MEQ tablet Take 4 tablets (40 mEq total) by mouth once. 10/02/11 10/01/12  Alexzandrew Julien Girt, PA  rivaroxaban (XARELTO) 10 MG TABS tablet Take 1 tablet (10 mg total) by mouth daily. 10/02/11   Alexzandrew Julien Girt, PA    Diet: regular  Activity:WBAT   Follow-up:in 2 weeks  Disposition: [facility]  Discharged Condition: good   Discharge Orders    Future Orders Please Complete By Expires   Diet - low sodium heart healthy      Call MD / Call 911      Comments:   If you experience chest pain or shortness of breath, CALL 911 and be transported to the hospital emergency room.  If you develope a fever above 101 F, pus (white drainage) or increased drainage or  redness at the wound, or calf pain, call your surgeon's office.   Constipation Prevention      Comments:   Drink plenty of fluids.  Prune juice may be helpful.  You may use a stool softener, such as Colace (over the counter) 100 mg twice a day.  Use MiraLax (over the counter) for constipation as needed.   Increase activity slowly as tolerated      Weight Bearing as taught in Physical Therapy      Comments:   Use a walker or crutches as instructed.   Discharge instructions      Comments:   Pick up stool softner and laxative for home. Do not submerge incision under water. May shower.    Driving restrictions      Comments:   No driving   Lifting restrictions      Comments:   No lifting     Current Discharge Medication List    START taking these medications   Details  acetaminophen (TYLENOL) 325 MG tablet Take 2 tablets (650 mg total) by mouth every 6 (six) hours as needed (or Fever >/= 101). Qty: 30 tablet, Refills: 0    bisacodyl (DULCOLAX) 5 MG EC tablet Take 2 tablets (10 mg total) by mouth daily as needed for constipation. Qty: 30 tablet, Refills: 0    docusate sodium 100 MG CAPS Take 100 mg by mouth 2 (two) times daily. Qty: 10 capsule, Refills: 0    magnesium hydroxide (MILK OF MAGNESIA) 400 MG/5ML suspension Take 30 mLs by mouth every 12 (twelve) hours as needed for constipation. Qty: 360 mL, Refills: 0    methocarbamol (ROBAXIN) 500 MG tablet Take 1 tablet (500 mg total) by mouth every 6 (six) hours as needed. Qty: 80  tablet, Refills: 0    ondansetron (ZOFRAN) 4 MG tablet Take 1 tablet (4 mg total) by mouth every 6 (six) hours as needed for nausea. Qty: 20 tablet, Refills: 0    oxyCODONE (OXY IR/ROXICODONE) 5 MG immediate release tablet Take 1-2 tablets (5-10 mg total) by mouth every 3 (three) hours as needed. Qty: 80 tablet, Refills: 0    polyethylene glycol (MIRALAX / GLYCOLAX) packet Take 17 g by mouth daily as needed. Qty: 14 each, Refills: 0    potassium  chloride (K-DUR) 10 MEQ tablet Take 4 tablets (40 mEq total) by mouth once. Qty: 1 tablet, Refills: 0    rivaroxaban (XARELTO) 10 MG TABS tablet Take 1 tablet (10 mg total) by mouth daily. Qty: 18 tablet, Refills: 0      CONTINUE these medications which have NOT CHANGED   Details  atorvastatin (LIPITOR) 40 MG tablet Take 40 mg by mouth daily.     metoprolol (TOPROL-XL) 50 MG 24 hr tablet Take 50 mg by mouth daily.       STOP taking these medications     cholecalciferol (VITAMIN D) 1000 UNITS tablet      meloxicam (MOBIC) 15 MG tablet      Multiple Vitamins-Minerals (MULTIVITAMIN WITH MINERALS) tablet        Follow-up Information    Follow up with ALUISIO,FRANK V in 2 weeks. (Please have staff at [facility] call and make appt. for patient and help make transportation arrangements.)    Contact information:   [facility] [address] [place] [address] [phone]          Signed: Patrica Duel 10/02/2011, 8:41 AM

## 2011-10-02 NOTE — Progress Notes (Signed)
Pt D/C to [facility] today for ST SNF placement via [facility] transport.

## 2011-11-14 ENCOUNTER — Encounter: Payer: Federal, State, Local not specified - PPO | Admitting: Physical Therapy

## 2011-11-14 ENCOUNTER — Ambulatory Visit: Payer: Federal, State, Local not specified - PPO | Admitting: Physical Therapy

## 2011-11-17 ENCOUNTER — Ambulatory Visit: Payer: Federal, State, Local not specified - PPO | Admitting: Physical Therapy

## 2011-11-19 ENCOUNTER — Encounter: Payer: Federal, State, Local not specified - PPO | Admitting: Physical Therapy

## 2012-05-17 ENCOUNTER — Other Ambulatory Visit: Payer: Self-pay | Admitting: Obstetrics and Gynecology

## 2012-05-17 DIAGNOSIS — R928 Other abnormal and inconclusive findings on diagnostic imaging of breast: Secondary | ICD-10-CM

## 2012-05-27 ENCOUNTER — Ambulatory Visit
Admission: RE | Admit: 2012-05-27 | Discharge: 2012-05-27 | Disposition: A | Discharge status: Home / Self Care | Payer: Medicare Other | Source: Ambulatory Visit | Attending: Obstetrics and Gynecology | Admitting: Obstetrics and Gynecology

## 2012-05-27 ENCOUNTER — Other Ambulatory Visit: Payer: Self-pay | Admitting: Obstetrics and Gynecology

## 2012-05-27 DIAGNOSIS — R928 Other abnormal and inconclusive findings on diagnostic imaging of breast: Secondary | ICD-10-CM

## 2012-06-07 ENCOUNTER — Other Ambulatory Visit: Payer: Self-pay | Admitting: Obstetrics and Gynecology

## 2012-06-07 ENCOUNTER — Ambulatory Visit
Admission: RE | Admit: 2012-06-07 | Discharge: 2012-06-07 | Disposition: A | Discharge status: Home / Self Care | Payer: Medicare Other | Source: Ambulatory Visit | Attending: Obstetrics and Gynecology | Admitting: Obstetrics and Gynecology

## 2012-06-07 DIAGNOSIS — R921 Mammographic calcification found on diagnostic imaging of breast: Secondary | ICD-10-CM

## 2012-06-07 DIAGNOSIS — R928 Other abnormal and inconclusive findings on diagnostic imaging of breast: Secondary | ICD-10-CM

## 2012-07-14 NOTE — H&P (Signed)
Christina Duarte DOB: 1946-05-11  Chief Compliant: right hip pain  History of Present Illness The patient is a 66 year old female who comes in today for a preoperative History and Physical. The patient is scheduled for a right total hip arthroplasty to be performed by Dr. Gus Rankin. Aluisio, MD at [facility] on Wednesday August 11, 2012 . She has been follwed for her left hip pain for about 6 months. She said the hip is doing much worse. It is limiting what she can and can not do. She hurts throughout the day and is hurting some at night. She is having a harder time walking. The intra-articular injection did not provide a tremendous amount of benefit. She is hurting badly and wants to get it fixed. At this point the most predictable means of getting her better would be a total hip arthroplasty.    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 Childhood Heart Palpatations Varicose veins Cataract Extraction-Bilateral. 2010 Diverticulosis DVT. after R TKA  Allergies ASPIRIN WITH CODEINE. "feels like I am flying high"   Family History Father. living age 20; prostate cancer, heart disease Mother. living age 85; DM type II, dementia, HTN   Social History Children. 3 Tobacco / smoke exposure. no Marital status. divorced Tobacco use. never smoker Living situation. lives alone Alcohol use. never consumed alcohol Number of flights of stairs before winded. 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 Metoprolol Succinate (50MG  Tablet ER 24HR, Oral at bedtime) Active. Meloxicam (15MG  Tablet, Oral every twenty-four hours, as needed) Active. Centrum Ultra Womens ( Oral daily) Active.    Past Surgical  History Tonsillectomy. Date: 1964. Tubal Ligation. Date: 62. Foot Surgery - Right. Date: 2012. Cataract Surgery. bilateral 2010 Total Knee Replacement. right 2007 Arthroscopy of Knee. right Breast Biopsy. right Total Knee Replacement - Left. 2012    Review of Systems General:Not Present- Chills, Fever, Night Sweats, Fatigue, Weight Gain, Weight Loss and Memory Loss. Skin:Present- Itching and Eczema. Not Present- Hives, Rash and Lesions. HEENT:Not Present- Tinnitus, Headache, Double Vision, Visual Loss, Hearing Loss and Dentures. Respiratory:Not Present- Shortness of breath with exertion, Shortness of breath at rest, Allergies, Coughing up blood and Chronic Cough. Cardiovascular:Present- Palpitations. Not Present- Chest Pain, Racing/skipping heartbeats, Difficulty Breathing Lying Down, Murmur and Swelling. Gastrointestinal:Not Present- Bloody Stool, Heartburn, Abdominal Pain, Vomiting, Nausea, Constipation, Diarrhea, Difficulty Swallowing, Jaundice and Loss of appetitie. Female Genitourinary:Not Present- Blood in Urine, Urinary frequency, Weak urinary stream, Discharge, Flank Pain, Incontinence, Painful Urination, Urgency, Urinary Retention and Urinating at Night. Musculoskeletal:Present- Muscle Weakness and Joint Pain. Not Present- Muscle Pain, Joint Swelling, Back Pain, Morning Stiffness and Spasms. Neurological:Not Present- Tremor, Dizziness, Blackout spells, Paralysis, Difficulty with balance and Weakness. Psychiatric:Not Present- Insomnia.   Vitals Weight: 213 lb Height: 68 in Body Surface Area: 2.15 m Body Mass Index: 32.39 kg/m Pulse: 68 (Regular) Resp.: 16 (Unlabored) BP: 138/84 (Sitting, Left Arm, Standard)    Physical Exam General Mental Status - Alert, cooperative and good historian. General Appearance- pleasant. Not in acute distress. Orientation- Oriented X3. Build & Nutrition- Overweight, Well nourished and Well  developed. Head and Neck Head- normocephalic, atraumatic . Neck Global Assessment- supple. no bruit auscultated on the right and no bruit auscultated on the left. Eye Pupil- Bilateral- Regular and Round. Motion- Bilateral- EOMI. Chest and Lung Exam Auscultation: Breath sounds:- clear at anterior chest  wall and - clear at posterior chest wall. Adventitious sounds:- No Adventitious sounds. Cardiovascular Auscultation:Rhythm- Regular rate and rhythm. Heart Sounds- S1 WNL and S2 WNL. Murmurs & Other Heart Sounds:Auscultation of the heart reveals - No Murmurs. Abdomen Palpation/Percussion:Tenderness- Abdomen is non-tender to palpation. Rigidity (guarding)- Abdomen is soft. Auscultation:Auscultation of the abdomen reveals - Bowel sounds normal. Female Genitourinary Not done, not pertinent to present illness Peripheral Vascular Upper Extremity: Palpation:- Pulses bilaterally normal. Lower Extremity: Palpation:- Pulses bilaterally normal. Neurologic Examination of related systems reveals - normal muscle strength and tone in all extremities. Neurologic evaluation reveals - normal sensation. Musculoskeletal Her left hip has normal range of motion, no discomfort. Right hip flexion 90, no internal or external rotation. Abduction only about 20. She does have discomfort with motion of the right hip. Normal painless motion of the knees   RADIOGRAPHS: AP pelvis, lateral of the hip, she has advanced endstage arthritis of the hip, bone on bone with slight protrusio and with some erosion of the femur.   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 Ricki Miller: PCP Dr. Tenny Craw: Davis Gourd, PA-C

## 2012-07-30 ENCOUNTER — Encounter (HOSPITAL_COMMUNITY): Payer: Self-pay | Admitting: Pharmacy Technician

## 2012-08-03 ENCOUNTER — Other Ambulatory Visit: Payer: Self-pay | Admitting: Orthopedic Surgery

## 2012-08-03 MED ORDER — DEXAMETHASONE SODIUM PHOSPHATE 10 MG/ML IJ SOLN: 10.0000 mg | Freq: Once | INTRAMUSCULAR | Status: DC

## 2012-08-03 MED ORDER — BUPIVACAINE LIPOSOME 1.3 % IJ SUSP: 20.0000 mL | Freq: Once | INTRAMUSCULAR | Status: DC

## 2012-08-03 NOTE — Progress Notes (Signed)
Preoperative surgical orders have been place into the Epic hospital system for Christina Duarte on 08/03/2012, 8:07 PM  by Patrica Duel for surgery on 08/11/2012.  Preop Total Hip orders including Experel Injecion, IV Tylenol, and IV Decadron as long as there are no contraindications to the above medications. [name], PA-C

## 2012-08-04 ENCOUNTER — Other Ambulatory Visit: Payer: Self-pay | Admitting: Orthopedic Surgery

## 2012-08-05 ENCOUNTER — Ambulatory Visit (HOSPITAL_COMMUNITY)
Admission: RE | Admit: 2012-08-05 | Discharge: 2012-08-05 | Disposition: A | Discharge status: Home / Self Care | Payer: Federal, State, Local not specified - PPO | Source: Ambulatory Visit | Attending: Orthopedic Surgery | Admitting: Orthopedic Surgery

## 2012-08-05 ENCOUNTER — Encounter (HOSPITAL_COMMUNITY)
Admission: RE | Admit: 2012-08-05 | Discharge: 2012-08-05 | Disposition: A | Discharge status: Home / Self Care | Payer: Federal, State, Local not specified - PPO | Source: Ambulatory Visit | Attending: Orthopedic Surgery | Admitting: Orthopedic Surgery

## 2012-08-05 ENCOUNTER — Encounter (HOSPITAL_COMMUNITY): Payer: Self-pay

## 2012-08-05 DIAGNOSIS — Z01812 Encounter for preprocedural laboratory examination: Secondary | ICD-10-CM | POA: Insufficient documentation

## 2012-08-05 DIAGNOSIS — M169 Osteoarthritis of hip, unspecified: Secondary | ICD-10-CM | POA: Insufficient documentation

## 2012-08-05 DIAGNOSIS — M1610 Unilateral primary osteoarthritis, unspecified hip: Secondary | ICD-10-CM | POA: Insufficient documentation

## 2012-08-05 DIAGNOSIS — Z01810 Encounter for preprocedural cardiovascular examination: Secondary | ICD-10-CM | POA: Insufficient documentation

## 2012-08-05 LAB — COMPREHENSIVE METABOLIC PANEL
ALT: 32 U/L (ref 0–35)
Alkaline Phosphatase: 118 U/L — ABNORMAL HIGH (ref 39–117)
CO2: 28 mEq/L (ref 19–32)
GFR calc Af Amer: >90 mL/min (ref >90)
Glucose, Bld: 82 mg/dL (ref 70–99)
Potassium: 4 mEq/L (ref 3.5–5.1)
Sodium: 139 mEq/L (ref 135–145)
Total Protein: 6.7 g/dL (ref 6.0–8.3)

## 2012-08-05 LAB — URINE MICROSCOPIC-ADD ON

## 2012-08-05 LAB — URINALYSIS, ROUTINE W REFLEX MICROSCOPIC
Hgb urine dipstick: NEGATIVE
Nitrite: NEGATIVE
Specific Gravity, Urine: 1.008 (ref 1.005–1.030)
Urobilinogen, UA: 0.2 mg/dL (ref 0.0–1.0)

## 2012-08-05 LAB — CBC
Hemoglobin: 13.7 g/dL (ref 12.0–15.0)
MCHC: 33.4 g/dL (ref 30.0–36.0)
RBC: 4.89 MIL/uL (ref 3.87–5.11)
WBC: 3.8 10*3/uL — ABNORMAL LOW (ref 4.0–10.5)

## 2012-08-05 LAB — SURGICAL PCR SCREEN: Staphylococcus aureus: NEGATIVE

## 2012-08-05 LAB — APTT: aPTT: 37 seconds (ref 24–37)

## 2012-08-05 NOTE — Pre-Procedure Instructions (Signed)
08-05-12 Rt. Hip Xr today . EKG today. CXR 11'12 in Epic.

## 2012-08-05 NOTE — Patient Instructions (Addendum)
19 Christina Duarte  08/05/2012   Your procedure is scheduled on: 10-9  -2013  Report to [facility] at    0630    AM..  Call this number if you have problems the morning of surgery: [phone]  Or Presurgical Testing [phone](Doyle Kunath)   Remember:   Do not eat food:After Midnight.   Take these medicines the morning of surgery with A SIP OF WATER: Tylenol. Metoprolol(Pm before). Bring Systane eye drops   Do not wear jewelry, make-up or nail polish.  Do not wear lotions, powders, or perfumes. You may wear deodorant.  Do not shave 48 hours prior to surgery.(face and neck okay, no shaving of legs)  Do not bring valuables to the hospital.  Contacts, dentures or bridgework may not be worn into surgery.  Leave suitcase in the car. After surgery it may be brought to your room.  For patients admitted to the hospital, checkout time is 11:00 AM the day of discharge.   Patients discharged the day of surgery will not be allowed to drive home. Must have responsible person with you x 24 hours once discharged.  Name and phone number of your driver: Maureen Ralphs, daughter [phone] cell  Special Instructions: CHG Shower Use Special Wash: see special instructions.(avoid face and genitals)   Please read over the following fact sheets that you were given: MRSA Information, Blood Transfusion fact sheet, Incentive Spirometry Instruction.

## 2012-08-09 ENCOUNTER — Other Ambulatory Visit: Payer: Self-pay | Admitting: Orthopedic Surgery

## 2012-08-11 ENCOUNTER — Ambulatory Visit (HOSPITAL_COMMUNITY): Payer: Medicare Other | Admitting: Certified Registered Nurse Anesthetist

## 2012-08-11 ENCOUNTER — Inpatient Hospital Stay (HOSPITAL_COMMUNITY)
Admission: RE | Admit: 2012-08-11 | Discharge: 2012-08-14 | DRG: 470 | Disposition: A | Discharge status: Skilled Nursing Facility | Payer: Medicare Other | Source: Ambulatory Visit | Attending: Orthopedic Surgery | Admitting: Orthopedic Surgery

## 2012-08-11 ENCOUNTER — Encounter (HOSPITAL_COMMUNITY)
Admission: RE | Disposition: A | Discharge status: Skilled Nursing Facility | Payer: Self-pay | Source: Ambulatory Visit | Attending: Orthopedic Surgery

## 2012-08-11 ENCOUNTER — Inpatient Hospital Stay (HOSPITAL_COMMUNITY): Payer: Medicare Other

## 2012-08-11 ENCOUNTER — Encounter (HOSPITAL_COMMUNITY): Payer: Self-pay | Admitting: *Deleted

## 2012-08-11 ENCOUNTER — Encounter (HOSPITAL_COMMUNITY): Payer: Self-pay | Admitting: Certified Registered Nurse Anesthetist

## 2012-08-11 DIAGNOSIS — Z86718 Personal history of other venous thrombosis and embolism: Secondary | ICD-10-CM

## 2012-08-11 DIAGNOSIS — Z96659 Presence of unspecified artificial knee joint: Secondary | ICD-10-CM

## 2012-08-11 DIAGNOSIS — M1610 Unilateral primary osteoarthritis, unspecified hip: Principal | ICD-10-CM | POA: Diagnosis present

## 2012-08-11 DIAGNOSIS — E871 Hypo-osmolality and hyponatremia: Secondary | ICD-10-CM | POA: Diagnosis not present

## 2012-08-11 DIAGNOSIS — Z96649 Presence of unspecified artificial hip joint: Secondary | ICD-10-CM

## 2012-08-11 DIAGNOSIS — Z79899 Other long term (current) drug therapy: Secondary | ICD-10-CM

## 2012-08-11 DIAGNOSIS — E7800 Pure hypercholesterolemia, unspecified: Secondary | ICD-10-CM | POA: Diagnosis present

## 2012-08-11 DIAGNOSIS — Z886 Allergy status to analgesic agent status: Secondary | ICD-10-CM

## 2012-08-11 DIAGNOSIS — Z6833 Body mass index (BMI) 33.0-33.9, adult: Secondary | ICD-10-CM

## 2012-08-11 DIAGNOSIS — R002 Palpitations: Secondary | ICD-10-CM | POA: Diagnosis present

## 2012-08-11 DIAGNOSIS — Z885 Allergy status to narcotic agent status: Secondary | ICD-10-CM

## 2012-08-11 DIAGNOSIS — Z9089 Acquired absence of other organs: Secondary | ICD-10-CM

## 2012-08-11 DIAGNOSIS — M169 Osteoarthritis of hip, unspecified: Secondary | ICD-10-CM

## 2012-08-11 DIAGNOSIS — E876 Hypokalemia: Secondary | ICD-10-CM | POA: Diagnosis not present

## 2012-08-11 DIAGNOSIS — I4949 Other premature depolarization: Secondary | ICD-10-CM | POA: Diagnosis present

## 2012-08-11 DIAGNOSIS — E669 Obesity, unspecified: Secondary | ICD-10-CM | POA: Diagnosis present

## 2012-08-11 HISTORY — PX: TOTAL HIP ARTHROPLASTY: SHX124

## 2012-08-11 LAB — TYPE AND SCREEN: Antibody Screen: NEGATIVE

## 2012-08-11 SURGERY — ARTHROPLASTY, HIP, TOTAL,POSTERIOR APPROACH
Anesthesia: General | Site: Hip | Laterality: Right | Wound class: Clean

## 2012-08-11 MED ORDER — PHENOL 1.4 % MT LIQD: 1.0000 | OROMUCOSAL | Status: DC | PRN

## 2012-08-11 MED ORDER — METOCLOPRAMIDE HCL 10 MG PO TABS: 5.0000 mg | ORAL_TABLET | Freq: Three times a day (TID) | ORAL | Status: DC | PRN

## 2012-08-11 MED ORDER — BUPIVACAINE LIPOSOME 1.3 % IJ SUSP
20.0000 mL | Freq: Once | INTRAMUSCULAR | Status: DC
  Filled 2012-08-11: qty 20

## 2012-08-11 MED ORDER — ONDANSETRON HCL 4 MG PO TABS: 4.0000 mg | ORAL_TABLET | Freq: Four times a day (QID) | ORAL | Status: DC | PRN

## 2012-08-11 MED ORDER — EPHEDRINE SULFATE 50 MG/ML IJ SOLN
INTRAMUSCULAR | Status: DC | PRN
  Administered 2012-08-11 (×3): 5 mg via INTRAVENOUS

## 2012-08-11 MED ORDER — CEFAZOLIN SODIUM-DEXTROSE 2-3 GM-% IV SOLR
2.0000 g | Freq: Four times a day (QID) | INTRAVENOUS | Status: AC
  Administered 2012-08-11 – 2012-08-12 (×2): 2 g via INTRAVENOUS
  Filled 2012-08-11 (×2): qty 50

## 2012-08-11 MED ORDER — BUPIVACAINE IN DEXTROSE 0.75-8.25 % IT SOLN
INTRATHECAL | Status: DC | PRN
  Administered 2012-08-11: 2 mL via INTRATHECAL

## 2012-08-11 MED ORDER — SODIUM CHLORIDE 0.9 % IV SOLN: INTRAVENOUS | Status: DC

## 2012-08-11 MED ORDER — PHENYLEPHRINE HCL 10 MG/ML IJ SOLN
INTRAMUSCULAR | Status: DC | PRN
  Administered 2012-08-11 (×2): 20 ug via INTRAVENOUS

## 2012-08-11 MED ORDER — ACETAMINOPHEN 10 MG/ML IV SOLN
1000.0000 mg | Freq: Four times a day (QID) | INTRAVENOUS | Status: AC
  Administered 2012-08-11 – 2012-08-12 (×4): 1000 mg via INTRAVENOUS
  Filled 2012-08-11 (×4): qty 100

## 2012-08-11 MED ORDER — POLYETHYLENE GLYCOL 3350 17 G PO PACK
17.0000 g | PACK | Freq: Every day | ORAL | Status: DC | PRN
  Administered 2012-08-13: 17 g via ORAL
  Filled 2012-08-11: qty 1

## 2012-08-11 MED ORDER — MORPHINE SULFATE 2 MG/ML IJ SOLN
1.0000 mg | INTRAMUSCULAR | Status: DC | PRN
  Administered 2012-08-11 (×2): 2 mg via INTRAVENOUS
  Filled 2012-08-11 (×2): qty 1

## 2012-08-11 MED ORDER — METOPROLOL SUCCINATE ER 50 MG PO TB24
50.0000 mg | ORAL_TABLET | Freq: Every day | ORAL | Status: DC
  Administered 2012-08-11 – 2012-08-13 (×3): 50 mg via ORAL
  Filled 2012-08-11 (×4): qty 1

## 2012-08-11 MED ORDER — RIVAROXABAN 10 MG PO TABS
10.0000 mg | ORAL_TABLET | Freq: Every day | ORAL | Status: DC
  Administered 2012-08-12 – 2012-08-14 (×3): 10 mg via ORAL
  Filled 2012-08-11 (×4): qty 1

## 2012-08-11 MED ORDER — TRAMADOL HCL 50 MG PO TABS
50.0000 mg | ORAL_TABLET | Freq: Four times a day (QID) | ORAL | Status: DC | PRN
  Administered 2012-08-12 (×2): 50 mg via ORAL
  Filled 2012-08-11: qty 1
  Filled 2012-08-11: qty 2

## 2012-08-11 MED ORDER — LACTATED RINGERS IV SOLN
INTRAVENOUS | Status: DC | PRN
  Administered 2012-08-11: 08:00:00 via INTRAVENOUS

## 2012-08-11 MED ORDER — FLEET ENEMA 7-19 GM/118ML RE ENEM: 1.0000 | ENEMA | Freq: Once | RECTAL | Status: AC | PRN

## 2012-08-11 MED ORDER — ATORVASTATIN CALCIUM 40 MG PO TABS
40.0000 mg | ORAL_TABLET | Freq: Every day | ORAL | Status: DC
  Administered 2012-08-11 – 2012-08-13 (×3): 40 mg via ORAL
  Filled 2012-08-11 (×4): qty 1

## 2012-08-11 MED ORDER — OXYCODONE HCL 5 MG PO TABS: 5.0000 mg | ORAL_TABLET | ORAL | Status: DC | PRN

## 2012-08-11 MED ORDER — PROMETHAZINE HCL 25 MG/ML IJ SOLN: 6.2500 mg | INTRAMUSCULAR | Status: DC | PRN

## 2012-08-11 MED ORDER — METHOCARBAMOL 100 MG/ML IJ SOLN
500.0000 mg | Freq: Four times a day (QID) | INTRAVENOUS | Status: DC | PRN
  Administered 2012-08-11: 500 mg via INTRAVENOUS
  Filled 2012-08-11: qty 5

## 2012-08-11 MED ORDER — 0.9 % SODIUM CHLORIDE (POUR BTL) OPTIME
TOPICAL | Status: DC | PRN
  Administered 2012-08-11: 1000 mL

## 2012-08-11 MED ORDER — FENTANYL CITRATE 0.05 MG/ML IJ SOLN
INTRAMUSCULAR | Status: DC | PRN
  Administered 2012-08-11: 50 ug via INTRAVENOUS
  Administered 2012-08-11 (×2): 25 ug via INTRAVENOUS
  Administered 2012-08-11: 50 ug via INTRAVENOUS

## 2012-08-11 MED ORDER — ACETAMINOPHEN 10 MG/ML IV SOLN
INTRAVENOUS | Status: AC
  Filled 2012-08-11: qty 100

## 2012-08-11 MED ORDER — CEFAZOLIN SODIUM-DEXTROSE 2-3 GM-% IV SOLR
INTRAVENOUS | Status: AC
  Filled 2012-08-11: qty 50

## 2012-08-11 MED ORDER — DEXTROSE-NACL 5-0.9 % IV SOLN
INTRAVENOUS | Status: DC
  Administered 2012-08-11 (×2): via INTRAVENOUS

## 2012-08-11 MED ORDER — HYDROMORPHONE HCL PF 1 MG/ML IJ SOLN
INTRAMUSCULAR | Status: AC
  Filled 2012-08-11: qty 1

## 2012-08-11 MED ORDER — BISACODYL 10 MG RE SUPP: 10.0000 mg | Freq: Every day | RECTAL | Status: DC | PRN

## 2012-08-11 MED ORDER — METOCLOPRAMIDE HCL 5 MG/ML IJ SOLN: 5.0000 mg | Freq: Three times a day (TID) | INTRAMUSCULAR | Status: DC | PRN

## 2012-08-11 MED ORDER — ACETAMINOPHEN 10 MG/ML IV SOLN
1000.0000 mg | Freq: Once | INTRAVENOUS | Status: AC
  Administered 2012-08-11: 1000 mg via INTRAVENOUS

## 2012-08-11 MED ORDER — ONDANSETRON HCL 4 MG/2ML IJ SOLN: 4.0000 mg | Freq: Four times a day (QID) | INTRAMUSCULAR | Status: DC | PRN

## 2012-08-11 MED ORDER — DOCUSATE SODIUM 100 MG PO CAPS
100.0000 mg | ORAL_CAPSULE | Freq: Two times a day (BID) | ORAL | Status: DC
  Administered 2012-08-11 – 2012-08-14 (×6): 100 mg via ORAL
  Filled 2012-08-11 (×3): qty 1

## 2012-08-11 MED ORDER — ACETAMINOPHEN 325 MG PO TABS
650.0000 mg | ORAL_TABLET | Freq: Four times a day (QID) | ORAL | Status: DC | PRN
  Administered 2012-08-12: 650 mg via ORAL
  Filled 2012-08-11: qty 2

## 2012-08-11 MED ORDER — DIPHENHYDRAMINE HCL 12.5 MG/5ML PO ELIX: 12.5000 mg | ORAL_SOLUTION | ORAL | Status: DC | PRN

## 2012-08-11 MED ORDER — MIDAZOLAM HCL 5 MG/5ML IJ SOLN
INTRAMUSCULAR | Status: DC | PRN
  Administered 2012-08-11: 2 mg via INTRAVENOUS

## 2012-08-11 MED ORDER — BUPIVACAINE LIPOSOME 1.3 % IJ SUSP
INTRAMUSCULAR | Status: DC | PRN
  Administered 2012-08-11: 20 mL

## 2012-08-11 MED ORDER — LACTATED RINGERS IV SOLN: INTRAVENOUS | Status: DC

## 2012-08-11 MED ORDER — METHOCARBAMOL 500 MG PO TABS
500.0000 mg | ORAL_TABLET | Freq: Four times a day (QID) | ORAL | Status: DC | PRN
  Administered 2012-08-12 (×3): 500 mg via ORAL
  Filled 2012-08-11 (×3): qty 1

## 2012-08-11 MED ORDER — CHLORHEXIDINE GLUCONATE 4 % EX LIQD
60.0000 mL | Freq: Once | CUTANEOUS | Status: DC
  Filled 2012-08-11: qty 60

## 2012-08-11 MED ORDER — HYDROMORPHONE HCL PF 1 MG/ML IJ SOLN
0.2500 mg | INTRAMUSCULAR | Status: DC | PRN
  Administered 2012-08-11 (×2): 0.5 mg via INTRAVENOUS

## 2012-08-11 MED ORDER — SODIUM CHLORIDE 0.9 % IJ SOLN
INTRAMUSCULAR | Status: DC | PRN
  Administered 2012-08-11: 50 mL

## 2012-08-11 MED ORDER — ACETAMINOPHEN 650 MG RE SUPP: 650.0000 mg | Freq: Four times a day (QID) | RECTAL | Status: DC | PRN

## 2012-08-11 MED ORDER — CEFAZOLIN SODIUM-DEXTROSE 2-3 GM-% IV SOLR
2.0000 g | INTRAVENOUS | Status: AC
  Administered 2012-08-11: 2 g via INTRAVENOUS

## 2012-08-11 MED ORDER — PROPOFOL INFUSION 10 MG/ML OPTIME
INTRAVENOUS | Status: DC | PRN
  Administered 2012-08-11: 10 ug/kg/min via INTRAVENOUS

## 2012-08-11 MED ORDER — MENTHOL 3 MG MT LOZG: 1.0000 | LOZENGE | OROMUCOSAL | Status: DC | PRN

## 2012-08-11 SURGICAL SUPPLY — 50 items
BAG SPEC THK2 15X12 ZIP CLS (MISCELLANEOUS) ×1
BAG ZIPLOCK 12X15 (MISCELLANEOUS) ×2 IMPLANT
BIT DRILL 2.8X128 (BIT) ×2 IMPLANT
BLADE EXTENDED COATED 6.5IN (ELECTRODE) ×2 IMPLANT
BLADE SAW SAG 73X25 THK (BLADE) ×1
BLADE SAW SGTL 73X25 THK (BLADE) ×1 IMPLANT
CLOTH BEACON ORANGE TIMEOUT ST (SAFETY) ×2 IMPLANT
DECANTER SPIKE VIAL GLASS SM (MISCELLANEOUS) ×2 IMPLANT
DRAPE INCISE IOBAN 66X45 STRL (DRAPES) ×2 IMPLANT
DRAPE ORTHO SPLIT 77X108 STRL (DRAPES) ×4
DRAPE POUCH INSTRU U-SHP 10X18 (DRAPES) ×2 IMPLANT
DRAPE SURG ORHT 6 SPLT 77X108 (DRAPES) ×2 IMPLANT
DRAPE U-SHAPE 47X51 STRL (DRAPES) ×2 IMPLANT
DRSG ADAPTIC 3X8 NADH LF (GAUZE/BANDAGES/DRESSINGS) ×2 IMPLANT
DRSG MEPILEX BORDER 4X4 (GAUZE/BANDAGES/DRESSINGS) ×2 IMPLANT
DRSG MEPILEX BORDER 4X8 (GAUZE/BANDAGES/DRESSINGS) ×2 IMPLANT
DURAPREP 26ML APPLICATOR (WOUND CARE) ×2 IMPLANT
ELECT REM PT RETURN 9FT ADLT (ELECTROSURGICAL) ×2
ELECTRODE REM PT RTRN 9FT ADLT (ELECTROSURGICAL) ×1 IMPLANT
EVACUATOR 1/8 PVC DRAIN (DRAIN) ×2 IMPLANT
FACESHIELD LNG OPTICON STERILE (SAFETY) ×8 IMPLANT
GLOVE BIO SURGEON STRL SZ8 (GLOVE) ×2 IMPLANT
GLOVE BIOGEL PI IND STRL 8 (GLOVE) ×2 IMPLANT
GLOVE BIOGEL PI INDICATOR 8 (GLOVE) ×2
GLOVE ECLIPSE 8.0 STRL XLNG CF (GLOVE) ×2 IMPLANT
GLOVE SURG SS PI 6.5 STRL IVOR (GLOVE) ×4 IMPLANT
GOWN STRL NON-REIN LRG LVL3 (GOWN DISPOSABLE) ×4 IMPLANT
GOWN STRL REIN XL XLG (GOWN DISPOSABLE) ×2 IMPLANT
IMMOBILIZER KNEE 20 (SOFTGOODS) ×2
IMMOBILIZER KNEE 20 THIGH 36 (SOFTGOODS) IMPLANT
KIT BASIN OR (CUSTOM PROCEDURE TRAY) ×2 IMPLANT
MANIFOLD NEPTUNE II (INSTRUMENTS) ×2 IMPLANT
NDL SAFETY ECLIPSE 18X1.5 (NEEDLE) ×1 IMPLANT
NEEDLE HYPO 18GX1.5 SHARP (NEEDLE) ×2
NS IRRIG 1000ML POUR BTL (IV SOLUTION) ×2 IMPLANT
PACK TOTAL JOINT (CUSTOM PROCEDURE TRAY) ×2 IMPLANT
PASSER SUT SWANSON 36MM LOOP (INSTRUMENTS) ×2 IMPLANT
POSITIONER SURGICAL ARM (MISCELLANEOUS) ×2 IMPLANT
SPONGE GAUZE 4X4 12PLY (GAUZE/BANDAGES/DRESSINGS) ×2 IMPLANT
STRIP CLOSURE SKIN 1/2X4 (GAUZE/BANDAGES/DRESSINGS) ×3 IMPLANT
SUT ETHIBOND NAB CT1 #1 30IN (SUTURE) ×4 IMPLANT
SUT MNCRL AB 4-0 PS2 18 (SUTURE) ×2 IMPLANT
SUT VIC AB 2-0 CT1 27 (SUTURE) ×6
SUT VIC AB 2-0 CT1 TAPERPNT 27 (SUTURE) ×3 IMPLANT
SUT VLOC 180 0 24IN GS25 (SUTURE) ×4 IMPLANT
SYR 50ML LL SCALE MARK (SYRINGE) ×2 IMPLANT
TOWEL OR 17X26 10 PK STRL BLUE (TOWEL DISPOSABLE) ×4 IMPLANT
TOWEL OR NON WOVEN STRL DISP B (DISPOSABLE) ×2 IMPLANT
TRAY FOLEY CATH 14FRSI W/METER (CATHETERS) ×2 IMPLANT
WATER STERILE IRR 1500ML POUR (IV SOLUTION) ×3 IMPLANT

## 2012-08-11 NOTE — Anesthesia Preprocedure Evaluation (Addendum)
Anesthesia Evaluation  Patient identified by MRN, date of birth, ID band Patient awake    Reviewed: Allergy & Precautions, H&P , NPO status , Patient's Chart, lab work & pertinent test results  Airway Mallampati: II TM Distance: >3 FB Neck ROM: Full    Dental No notable dental hx.    Pulmonary neg pulmonary ROS,  breath sounds clear to auscultation  Pulmonary exam normal       Cardiovascular + dysrhythmias Rhythm:Regular Rate:Normal  Palpitations.  DVT after R TKA   Neuro/Psych negative neurological ROS  negative psych ROS   GI/Hepatic negative GI ROS, Neg liver ROS,   Endo/Other  negative endocrine ROS  Renal/GU negative Renal ROS  negative genitourinary   Musculoskeletal negative musculoskeletal ROS (+)   Abdominal (+) + obese,   Peds negative pediatric ROS (+)  Hematology negative hematology ROS (+)   Anesthesia Other Findings   Reproductive/Obstetrics negative OB ROS                          Anesthesia Physical Anesthesia Plan  ASA: II  Anesthesia Plan: General   Post-op Pain Management:    Induction: Intravenous  Airway Management Planned:   Additional Equipment:   Intra-op Plan:   Post-operative Plan:   Informed Consent: I have reviewed the patients History and Physical, chart, labs and discussed the procedure including the risks, benefits and alternatives for the proposed anesthesia with the patient or authorized representative who has indicated his/her understanding and acceptance.   Dental advisory given  Plan Discussed with: CRNA  Anesthesia Plan Comments: (Discussed risks/benefits of spinal versus general including headache, backache, failure, bleeding, infection, and nerve damage. Patient consents to spinal. Questions answered. Coagulation studies and platelet count acceptable. She had a spinal for left tka 09/29/11, however, she did not remember this.)        Anesthesia Quick Evaluation

## 2012-08-11 NOTE — Transfer of Care (Signed)
Immediate Anesthesia Transfer of Care Note  Patient: Christina Duarte  Procedure(s) Performed: Procedure(s) (LRB) with comments: TOTAL HIP ARTHROPLASTY (Right)  Patient Location: PACU  Anesthesia Type: Regional  Level of Consciousness: awake and sedated  Airway & Oxygen Therapy: Patient connected to face mask oxygen  Post-op Assessment: Report given to PACU RN  Post vital signs: Reviewed and stable  Complications: No apparent anesthesia complications

## 2012-08-11 NOTE — Progress Notes (Signed)
Utilization review completed.  

## 2012-08-11 NOTE — Anesthesia Postprocedure Evaluation (Signed)
  Anesthesia Post-op Note  Patient: Christina Duarte  Procedure(s) Performed: Procedure(s) (LRB): TOTAL HIP ARTHROPLASTY (Right)  Patient Location: PACU  Anesthesia Type: Spinal  Level of Consciousness: awake and alert   Airway and Oxygen Therapy: Patient Spontanous Breathing  Post-op Pain: mild  Post-op Assessment: Post-op Vital signs reviewed, Patient's Cardiovascular Status Stable, Respiratory Function Stable, Patent Airway and No signs of Nausea or vomiting  Post-op Vital Signs: stable  Complications: No apparent anesthesia complications

## 2012-08-11 NOTE — Anesthesia Procedure Notes (Signed)
Spinal  Patient location during procedure: OR Start time: 08/11/2012 8:20 AM Staffing Anesthesiologist: Azell Der Performed by: anesthesiologist  Preanesthetic Checklist Completed: patient identified, site marked, surgical consent, pre-op evaluation, timeout performed, IV checked, risks and benefits discussed and monitors and equipment checked Spinal Block Patient position: sitting Prep: Betadine Patient monitoring: heart rate, cardiac monitor, continuous pulse ox and blood pressure Approach: midline Location: L3-4 Injection technique: single-shot Needle Needle type: Quincke  Needle gauge: 22 G Additional Notes Clear CSF obtained. No paresthesia. Tolerated well. Adequate level.

## 2012-08-11 NOTE — Plan of Care (Signed)
Problem: Consults Goal: Diagnosis- Total Joint Replacement Outcome: Completed/Met Date Met:  08/11/12 progressing

## 2012-08-11 NOTE — Op Note (Signed)
Pre-operative diagnosis- Osteoarthritis Right hip  Post-operative diagnosis- Osteoarthritis  Right hip  Procedure-  RightTotal Hip Arthroplasty  Surgeon- Gus Rankin. Annais Crafts, MD  Assistant- Leilani Able, PA-C   Anesthesia  Spinal  EBL- 150   Drain Hemovac   Complication- None  Condition-PACU - hemodynamically stable.   Brief Clinical Note-  Christina Duarte is a 66 y.o. female with end stage arthritis of her right hip with progressively worsening pain and dysfunction. Pain occurs with activity and rest including pain at night. She has tried analgesics, protected weight bearing and rest without benefit. Pain is too severe to attempt physical therapy. Radiographs demonstrate bone on bone arthritis with subchondral cyst formation. She presents now for right THA.  Procedure in detail-   The patient is brought into the operating room and placed on the operating table. After successful administration of Spinal  anesthesia, the patient is placed in the  Left lateral decubitus position with the  Right side up and held in place with the hip positioner. The lower extremity is isolated from the perineum with plastic drapes and time-out is performed by the surgical team. The lower extremity is then prepped and draped in the usual sterile fashion. A short posterolateral incision is made with a ten blade through the subcutaneous tissue to the level of the fascia lata which is incised in line with the skin incision. The sciatic nerve is palpated and protected and the short external rotators and capsule are isolated from the femur. The hip is then dislocated and the center of the femoral head is marked. A trial prosthesis is placed such that the trial head corresponds to the center of the patients' native femoral head. The resection level is marked on the femoral neck and the resection is made with an oscillating saw. The femoral head is removed and femoral retractors placed to gain access to the femoral canal.      The canal finder is passed into the femoral canal and the canal is thoroughly irrigated with sterile saline to remove the fatty contents. Axial reaming is performed to 15.5  mm, proximal reaming to 20D  and the sleeve machined to a small. A 20D small trial sleeve is placed into the proximal femur.      The femur is then retracted anteriorly to gain acetabular exposure. Acetabular retractors are placed and the labrum and osteophytes are removed, Acetabular reaming is performed to 53  mm and a 54  mm Pinnacle acetabular shell is placed in anatomic position with excellent purchase. Additional dome screws were not needed. The permanent 36 mm neutral + 4 Marathon liner is placed into the acetabular shell.      The trial femur is then placed into the femoral canal. The size is 20 x 15  stem with a 36 + 8  neck and a 36 + 0 head with the neck version matching  the patients' native anteversion. The hip is reduced with excellent stability with full extension and full external rotation, 70 degrees flexion with 40 degrees adduction and 90 degrees internal rotation and 90 degrees of flexion with 70 degrees of internal rotation. The operative leg is placed on top of the non-operative leg and the leg lengths are found to be equal. The trials are then removed and the permanent implant of the same size is impacted into the femoral canal. The ceramic  femoral head of the same size as the trial is placed and the hip is reduced with the same stability parameters.  The operative leg is again placed on top of the non-operative leg and the leg lengths are found to be equal.      The wound is then copiously irrigated with saline solution and the capsule and short external rotators are re-attached to the femur through drill holes with Ethibond suture. The fascia lata is closed over a hemovac drain with #1 vicryl suture and the fascia lata, gluteal muscles and subcutaneous tissues are injected with Exparel 20ml diluted with saline  50ml. The subcutaneous tissues are closed with #1 and2-0 vicryl and the subcuticular layer closed with running 4-0 Monocryl. The drain is hooked to suction, incision cleaned and dried, and steri-srips and a bulky sterile dressing applied. The limb is placed into a knee immobilizer and the patient is awakened and transported to recovery in stable condition.      Please note that a surgical assistant was a medical necessity for this procedure in order to perform it in a safe and expeditious manner. The assistant was necessary to provide retraction to the vital neurovascular structures and to retract and position the limb to allow for anatomic placement of the prosthetic components.  Gus Rankin Rhiley Tarver, MD    08/11/2012, 9:36 AM

## 2012-08-11 NOTE — Interval H&P Note (Signed)
History and Physical Interval Note:  08/11/2012 8:06 AM  Christina Duarte  has presented today for surgery, with the diagnosis of osteoarthritis right hip  The various methods of treatment have been discussed with the patient and family. After consideration of risks, benefits and other options for treatment, the patient has consented to  Procedure(s) (LRB) with comments: TOTAL HIP ARTHROPLASTY (Right) as a surgical intervention .  The patient's history has been reviewed, patient examined, no change in status, stable for surgery.  I have reviewed the patient's chart and labs.  Questions were answered to the patient's satisfaction.     Loanne Drilling

## 2012-08-12 ENCOUNTER — Encounter (HOSPITAL_COMMUNITY): Payer: Self-pay | Admitting: Orthopedic Surgery

## 2012-08-12 DIAGNOSIS — E876 Hypokalemia: Secondary | ICD-10-CM | POA: Diagnosis not present

## 2012-08-12 LAB — CBC
MCH: 28.4 pg (ref 26.0–34.0)
MCV: 83.7 fL (ref 78.0–100.0)
Platelets: 203 10*3/uL (ref 150–400)
RDW: 14.6 % (ref 11.5–15.5)
WBC: 6.6 10*3/uL (ref 4.0–10.5)

## 2012-08-12 LAB — BASIC METABOLIC PANEL
Calcium: 8.9 mg/dL (ref 8.4–10.5)
Creatinine, Ser: 0.52 mg/dL (ref 0.50–1.10)
GFR calc Af Amer: >90 mL/min (ref >90)

## 2012-08-12 NOTE — Progress Notes (Signed)
   Subjective: 1 Day Post-Op Procedure(s) (LRB): TOTAL HIP ARTHROPLASTY (Right) Patient reports pain as mild and moderate.   Patient seen in rounds with Dr. Lequita Halt. Up in chair. Patient is well, and has had no acute complaints or problems We will start therapy today.  Plan is to go Skilled nursing facility after hospital stay.  Objective: Vital signs in last 24 hours: Temp:  [97.4 F (36.3 C)-99.2 F (37.3 C)] 99.2 F (37.3 C) (10/10 0600) Pulse Rate:  [49-64] 64  (10/10 0600) Resp:  [12-18] 18  (10/10 0600) BP: (109-177)/(48-92) 152/83 mmHg (10/10 0600) SpO2:  [99 %-100 %] 99 % (10/10 0600) Weight:  [99.791 kg (220 lb)] 99.791 kg (220 lb) (10/09 2100)  Intake/Output from previous day:  Intake/Output Summary (Last 24 hours) at 08/12/12 1130 Last data filed at 08/12/12 1000  Gross per 24 hour  Intake 2482.5 ml  Output   5585 ml  Net -3102.5 ml    Intake/Output this shift: Total I/O In: -  Out: 200 [Urine:200]  Labs:  [facility] 08/12/12 0350  HGB 12.9    Basename 08/12/12 0350  WBC 6.6  RBC 4.55  HCT 38.1  PLT 203    Basename 08/12/12 0350  NA 133*  K 3.4*  CL 98  CO2 27  BUN 7  CREATININE 0.52  GLUCOSE 163*  CALCIUM 8.9   No results found for this basename: LABPT:2,INR:2 in the last 72 hours  EXAM General - Patient is Alert, Appropriate and Oriented Extremity - Neurovascular intact Sensation intact distally Dorsiflexion/Plantar flexion intact Dressing - dressing C/D/I Motor Function - intact, moving foot and toes well on exam.  Hemovac pulled without difficulty.  Past Medical History  Diagnosis Date  . Blood transfusion   . PVC's (premature ventricular contractions)     HX OF HEART PALPITATIONS AND PVC'S  . PVC's (premature ventricular contractions)     PT STATES IF SHE NEEDS HEART DOCTOR WHILE IN HOSP-SHE WANTS DR. PANG CALLED-HER MEDICAL DOCTOR AND DR. Allyson Sabal  . 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   Weight as of this encounter: 220 lb(99.791 kg). Advance diet Up with therapy Discharge to SNF  DVT Prophylaxis - Xarelto Weight Bearing As Tolerated right Leg D/C Knee Immobilizer Hemovac Pulled Begin Therapy Hip Preacutions No vaccines.  Christina Duarte 08/12/2012, 11:30 AM

## 2012-08-12 NOTE — Progress Notes (Signed)
Clinical Social Work Department CLINICAL SOCIAL WORK PLACEMENT NOTE 08/12/2012  Patient:  Christina Duarte, Christina Duarte  Account Number:  [id number] Admit date:  08/11/2012  Clinical Social Worker:  Cori Razor, LCSW  Date/time:  08/12/2012 03:35 PM  Clinical Social Work is seeking post-discharge placement for this patient at the following level of care:   SKILLED NURSING   (*CSW will update this form in Epic as items are completed)     Patient/family provided with [facility] Department of Clinical Social Work's list of facilities offering this level of care within the geographic area requested by the patient (or if unable, by the patient's family).    Patient/family informed of their freedom to choose among providers that offer the needed level of care, that participate in Medicare, Medicaid or managed care program needed by the patient, have an available bed and are willing to accept the patient.    Patient/family informed of [facility]' ownership interest in [facility], as well as of the fact that they are under no obligation to receive care at this facility.  PASARR submitted to EDS on  PASARR number received from [employer] on 09/30/2011  FL2 transmitted to all facilities in geographic area requested by pt/family on  08/12/2012 FL2 transmitted to all facilities within larger geographic area on   Patient informed that his/her managed care company has contracts with or will negotiate with  certain facilities, including the following:     Patient/family informed of bed offers received:  08/12/2012 Patient chooses bed at [facility] Physician recommends and patient chooses bed at    Patient to be transferred to  on   Patient to be transferred to facility by   The following physician request were entered in Epic:   Additional Comments:  Cori Razor LCSW [phone]

## 2012-08-12 NOTE — Evaluation (Signed)
Physical Therapy Evaluation Patient Details Name: Christina Duarte MRN: [id number] DOB: Jan 15, 1946 Today's Date: 08/12/2012 Time: 4098-1191 PT Time Calculation (min): 17 min  PT Assessment / Plan / Recommendation Clinical Impression  66 yo female s/p R THA. On eval, pt is significantly limited by pain R LE. Demonstrates weakness and decreased activity tolerance. Recommend continued rehab at [facility].     PT Assessment  Patient needs continued PT services    Follow Up Recommendations  Post acute inpatient    Does the patient have the potential to tolerate intense rehabilitation   No, Recommend SNF  Barriers to Discharge Decreased caregiver support      Equipment Recommendations  Rolling walker with 5" wheels    Recommendations for Other Services OT consult   Frequency 7X/week    Precautions / Restrictions Precautions Precautions: Posterior Hip Precaution Booklet Issued: Yes (comment) Precaution Comments: Verbally reviewed and demonstrated hip precautions.  Restrictions Weight Bearing Restrictions: No RLE Weight Bearing: Weight bearing as tolerated   Pertinent Vitals/Pain       Mobility  Bed Mobility Bed Mobility: Not assessed Details for Bed Mobility Assistance: Pt up sitting in recliner (nursing assisted pt OOB) Transfers Transfers: Sit to Stand;Stand to Sit Sit to Stand: 4: Min assist;From chair/3-in-1;With upper extremity assist Stand to Sit: 4: Min assist;To chair/3-in-1;With upper extremity assist;With armrests Details for Transfer Assistance: VCs safety, technique, hand placement. Assist to rise, stabilize, control descent.  Ambulation/Gait Ambulation/Gait Assistance: 4: Min assist Ambulation Distance (Feet): 25 Feet Assistive device: Rolling walker Ambulation/Gait Assistance Details: VCs safety, technique, sequence. Pt having difficulty advancing R LE forward-intermittent assist (from pt or therapist) to move leg forward. Fatigues easily. Limited by pain.  Gait  Pattern: Step-to pattern;Decreased step length - right;Decreased step length - left;Decreased stride length;Antalgic;Trunk flexed    Shoulder Instructions     Exercises     PT Diagnosis: Difficulty walking;Abnormality of gait;Acute pain  PT Problem List: Decreased strength;Decreased range of motion;Decreased activity tolerance;Decreased mobility;Pain;Decreased knowledge of precautions;Decreased knowledge of use of DME PT Treatment Interventions: DME instruction;Gait training;Functional mobility training;Therapeutic activities;Therapeutic exercise;Patient/family education   PT Goals Acute Rehab PT Goals PT Goal Formulation: With patient Time For Goal Achievement: 08/26/12 Potential to Achieve Goals: Good Pt will go Supine/Side to Sit: with supervision PT Goal: Supine/Side to Sit - Progress: Goal set today Pt will go Sit to Supine/Side: with supervision PT Goal: Sit to Supine/Side - Progress: Goal set today Pt will go Sit to Stand: with supervision PT Goal: Sit to Stand - Progress: Goal set today Pt will Ambulate: 51 - 150 feet;with supervision;with rolling walker PT Goal: Ambulate - Progress: Goal set today  Visit Information  Last PT Received On: 08/12/12 Assistance Needed: +1    Subjective Data  Subjective: "It hurts so bad...here in the thigh area" Patient Stated Goal: SNF   Prior Functioning  Home Living Lives With: Alone Home Adaptive Equipment: Straight cane Additional Comments: Pt plans to d/c to SNF Prior Function Level of Independence: Independent with assistive device(s) Comments: Pt plans to d/c to SNF Communication Communication: No difficulties    Cognition  Overall Cognitive Status: Appears within functional limits for tasks assessed/performed Arousal/Alertness: Awake/alert Orientation Level: Appears intact for tasks assessed Behavior During Session: [facility] for tasks performed    Extremity/Trunk Assessment Right Lower Extremity Assessment RLE  ROM/Strength/Tone: Due to pain;Unable to fully assess;Deficits RLE ROM/Strength/Tone Deficits: Pt having difficulty advancing L LE forward.  Left Lower Extremity Assessment LLE ROM/Strength/Tone: [facility] for tasks assessed Trunk Assessment Trunk  Assessment: Normal   Balance    End of Session PT - End of Session Equipment Utilized During Treatment: Gait belt Activity Tolerance: Patient limited by pain Patient left: in chair;with call bell/phone within reach  GP     [name] [facility] 08/12/2012, 11:23 AM [phone]

## 2012-08-12 NOTE — Progress Notes (Signed)
Physical Therapy Treatment Patient Details Name: Christina Duarte MRN: [id number] DOB: 14-Nov-1945 Today's Date: 08/12/2012 Time: 8295-6213 PT Time Calculation (min): 24 min  PT Assessment / Plan / Recommendation Comments on Treatment Session  Progressing slowly. Will need SNF for rehab.     Follow Up Recommendations  Post acute inpatient     Does the patient have the potential to tolerate intense rehabilitation  No, Recommend SNF  Barriers to Discharge        Equipment Recommendations  Rolling walker with 5" wheels    Recommendations for Other Services    Frequency 7X/week   Plan Discharge plan remains appropriate    Precautions / Restrictions Precautions Precautions: Posterior Hip Precaution Booklet Issued: Yes (comment) Restrictions Weight Bearing Restrictions: No RLE Weight Bearing: Weight bearing as tolerated   Pertinent Vitals/Pain 8/1o R hip    Mobility  Bed Mobility Bed Mobility: Not assessed Details for Bed Mobility Assistance: Pt did not want to get back in bed.  Transfers Transfers: Sit to Stand;Stand to Sit Sit to Stand: 4: Min assist;From chair/3-in-1;With armrests Stand to Sit: 4: Min assist;To chair/3-in-1;With armrests Details for Transfer Assistance: VCs safety, technique, hand placement. Assist to extend R LE and to scoot forward in recliner.  Ambulation/Gait Ambulation/Gait Assistance: 4: Min assist Ambulation Distance (Feet): 35 Feet Assistive device: Rolling walker Ambulation/Gait Assistance Details: VCS safety, technique, sequence. Limited by pain. Gait Pattern: Step-to pattern;Antalgic;Decreased stride length;Decreased step length - right;Decreased step length - left    Exercises Total Joint Exercises Ankle Circles/Pumps: AROM;Both;10 reps;Seated Quad Sets: AROM;10 reps;Seated;Both Short Arc Quad: AROM;Left;10 reps;Seated Heel Slides: AAROM;Left;10 reps;Seated Hip ABduction/ADduction: AAROM;Left;10 reps;Seated   PT Diagnosis:    PT  Problem List:   PT Treatment Interventions:     PT Goals Acute Rehab PT Goals Pt will go Sit to Stand: with supervision PT Goal: Sit to Stand - Progress: Progressing toward goal Pt will Ambulate: 51 - 150 feet;with supervision;with rolling walker PT Goal: Ambulate - Progress: Progressing toward goal  Visit Information  Last PT Received On: 08/12/12 Assistance Needed: +1    Subjective Data  Subjective: "I'm not getting back into the bed now. It makes my butt and shoulder blades hurt" Patient Stated Goal: Less pain.    Cognition  Overall Cognitive Status: Appears within functional limits for tasks assessed/performed Arousal/Alertness: Awake/alert Orientation Level: Appears intact for tasks assessed Behavior During Session: [facility] for tasks performed    Balance     End of Session PT - End of Session Equipment Utilized During Treatment: Gait belt Activity Tolerance: Patient limited by pain Patient left: in chair;with call bell/phone within reach   GP     [name] [facility] 08/12/2012, 3:53 PM [phone]

## 2012-08-12 NOTE — Care Management Note (Signed)
    Page 1 of 1   08/12/2012     11:36:29 AM   CARE MANAGEMENT NOTE 08/12/2012  Patient:  Christina Duarte, Christina Duarte   Account Number:  [id number]  Date Initiated:  08/12/2012  Documentation initiated by:  Lorenda Ishihara  Subjective/Objective Assessment:   66 yo female admitted s/p right THA. PTA lived at home.     Action/Plan:   d/c to SNF for rehab   Anticipated DC Date:  08/14/2012   Anticipated DC Plan:  SKILLED NURSING FACILITY  In-house referral  Clinical Social Worker      DC Planning Services  CM consult      Choice offered to / List presented to:             Status of service:  Completed, signed off Medicare Important Message given?   (If response is "NO", the following Medicare IM given date fields will be blank) Date Medicare IM given:   Date Additional Medicare IM given:    Discharge Disposition:  SKILLED NURSING FACILITY  Per UR Regulation:  Reviewed for med. necessity/level of care/duration of stay  If discussed at Long Length of Stay Meetings, dates discussed:    Comments:

## 2012-08-12 NOTE — Progress Notes (Signed)
Clinical Social Work Department BRIEF PSYCHOSOCIAL ASSESSMENT 08/12/2012  Patient:  Christina Duarte, Christina Duarte     Account Number:  [id number]     Admit date:  08/11/2012  Clinical Social Worker:  Candie Chroman  Date/Time:  08/12/2012 03:29 PM  Referred by:  CSW  Date Referred:  08/12/2012 Referred for  SNF Placement   Other Referral:   Interview type:  Patient Other interview type:    PSYCHOSOCIAL DATA Living Status:  ALONE Admitted from facility:   Level of care:   Primary support name:  Christina Duarte Primary support relationship to patient:  CHILD, ADULT Degree of support available:   supportive    CURRENT CONCERNS Current Concerns  Post-Acute Placement   Other Concerns:    SOCIAL WORK ASSESSMENT / PLAN Pt is a 66 yr old female living at home prior to hospitalization. CSW met with pt to assist with d/c planning needs. Pt has made prior arrangements to have ST rehab at [facility] following hospital d/c. CSW contacted SNF and d/c plan has been confirmed. CSW will continue to follow to assist with d/c planning to SNF.   Assessment/plan status:  Psychosocial Support/Ongoing Assessment of Needs Other assessment/ plan:   Information/referral to community resources:   None needed at this time.    PATIENT'S/FAMILY'S RESPONSE TO PLAN OF CARE: Pt is looking forward to having her rehab at [facility].    Cori Razor LCSW [phone]

## 2012-08-12 NOTE — Evaluation (Signed)
Occupational Therapy Evaluation Patient Details Name: Christina Duarte MRN: [id number] DOB: 09/24/1946 Today's Date: 08/12/2012 Time: 4010-2725 OT Time Calculation (min): 17 min  OT Assessment / Plan / Recommendation Clinical Impression  Pt presents POD 1 RTHR. Skilled OT recommended to maximize independence with BADLs to supervision/min A level in prep for d/c to next venue of care.    OT Assessment  Patient needs continued OT Services    Follow Up Recommendations  Skilled nursing facility    Barriers to Discharge Decreased caregiver support    Equipment Recommendations  Rolling walker with 5" wheels    Recommendations for Other Services    Frequency  Min 1X/week    Precautions / Restrictions Precautions Precautions: Posterior Hip Precaution Booklet Issued: Yes (comment) Precaution Comments: Verball reviewed and demonstrated hip precautions.  Restrictions Weight Bearing Restrictions: No RLE Weight Bearing: Weight bearing as tolerated   Pertinent Vitals/Pain Pt reported increasing pain with mobility. Repositioned for comfort and cold applied.    ADL  Grooming: Simulated;Set up Where Assessed - Grooming: Unsupported sitting Upper Body Bathing: Simulated;Set up Where Assessed - Upper Body Bathing: Unsupported sitting Lower Body Bathing: Simulated;Moderate assistance Where Assessed - Lower Body Bathing: Supported sit to stand Upper Body Dressing: Simulated;Set up Where Assessed - Upper Body Dressing: Unsupported sitting Lower Body Dressing: Simulated;Maximal assistance Where Assessed - Lower Body Dressing: Supported sit to stand Toilet Transfer: Simulated;Minimal assistance Toilet Transfer Method: Sit to Barista: Other (comment) (to recliner) Toileting - Clothing Manipulation and Hygiene: Simulated;Moderate assistance Where Assessed - Toileting Clothing Manipulation and Hygiene: Standing Equipment Used: Rolling walker Transfers/Ambulation Related  to ADLs: Pt fatigues quickly and has difficulty advancing R LE during ambulation. ADL Comments:  Pt states she has a reacher, shoe horn and long sponge at home but not a sock aid.    OT Diagnosis: Generalized weakness  OT Problem List: Decreased activity tolerance;Decreased knowledge of use of DME or AE;Pain OT Treatment Interventions: Self-care/ADL training;Therapeutic activities;DME and/or AE instruction;Patient/family education   OT Goals Acute Rehab OT Goals OT Goal Formulation: With patient Time For Goal Achievement: 08/26/12 Potential to Achieve Goals: Good ADL Goals Pt Will Perform Grooming: with supervision;Standing at sink ADL Goal: Grooming - Progress: Goal set today Pt Will Perform Lower Body Bathing: with min assist;Sit to stand from bed;Sit to stand from chair;with adaptive equipment ADL Goal: Lower Body Bathing - Progress: Goal set today Pt Will Perform Lower Body Dressing: with min assist;Sit to stand from bed;Sit to stand from chair;with adaptive equipment ADL Goal: Lower Body Dressing - Progress: Goal set today Pt Will Transfer to Toilet: with supervision;3-in-1;Raised toilet seat with arms;Ambulation ADL Goal: Toilet Transfer - Progress: Goal set today Pt Will Perform Toileting - Clothing Manipulation: with supervision;Sitting on 3-in-1 or toilet;Standing ADL Goal: Toileting - Clothing Manipulation - Progress: Goal set today Pt Will Perform Toileting - Hygiene: with supervision;Sit to stand from 3-in-1/toilet ADL Goal: Toileting - Hygiene - Progress: Goal set today  Visit Information  Last OT Received On: 08/12/12 Assistance Needed: +1    Subjective Data  Subjective: 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 Patient Stated Goal: Go to [facility] for rehab.   Prior Functioning     Home Living Lives With: Alone Available Help at Discharge: Skilled Nursing Facility Home Adaptive Equipment: Bedside commode/3-in-1;Straight cane Additional Comments: Pt plans to d/c to  SNF Prior Function Level of Independence: Independent with assistive device(s) Able to Take Stairs?: Yes Driving: Yes Vocation: Retired Comments: Pt plans to d/c  to SNF Communication Communication: No difficulties Dominant Hand: Right         Vision/Perception     Cognition  Overall Cognitive Status: Appears within functional limits for tasks assessed/performed Arousal/Alertness: Awake/alert Orientation Level: Appears intact for tasks assessed Behavior During Session: [facility] for tasks performed    Extremity/Trunk Assessment Right Upper Extremity Assessment RUE ROM/Strength/Tone: [facility] for tasks assessed Left Upper Extremity Assessment LUE ROM/Strength/Tone: [facility] for tasks assessed Right Lower Extremity Assessment RLE ROM/Strength/Tone: Due to pain;Unable to fully assess;Deficits RLE ROM/Strength/Tone Deficits: Pt having difficulty advancing L LE forward.  Left Lower Extremity Assessment LLE ROM/Strength/Tone: [facility] for tasks assessed Trunk Assessment Trunk Assessment: Normal     Mobility Bed Mobility Bed Mobility: Not assessed Details for Bed Mobility Assistance: Pt up sitting in recliner (nursing assisted pt OOB) Transfers Sit to Stand: 4: Min assist;From chair/3-in-1;With upper extremity assist Stand to Sit: 4: Min assist;To chair/3-in-1;With upper extremity assist;With armrests Details for Transfer Assistance: VCs safety, technique, hand placement. Assist to rise, stabilize, control descent.      Shoulder Instructions     Exercise     Balance     End of Session OT - End of Session Equipment Utilized During Treatment: Gait belt Activity Tolerance: Patient limited by pain;Patient limited by fatigue Patient left: in chair;with call bell/phone within reach  GO     Christina Duarte A OTR/L [phone] 08/12/2012, 12:00 PM

## 2012-08-13 LAB — BASIC METABOLIC PANEL
CO2: 26 mEq/L (ref 19–32)
Chloride: 100 mEq/L (ref 96–112)
Glucose, Bld: 105 mg/dL — ABNORMAL HIGH (ref 70–99)
Potassium: 3.2 mEq/L — ABNORMAL LOW (ref 3.5–5.1)
Sodium: 137 mEq/L (ref 135–145)

## 2012-08-13 LAB — CBC
Hemoglobin: 12.7 g/dL (ref 12.0–15.0)
RBC: 4.51 MIL/uL (ref 3.87–5.11)
WBC: 8 10*3/uL (ref 4.0–10.5)

## 2012-08-13 MED ORDER — OXYCODONE HCL 5 MG PO TABS: 5.0000 mg | ORAL_TABLET | ORAL | Status: DC | PRN

## 2012-08-13 MED ORDER — POLYETHYLENE GLYCOL 3350 17 G PO PACK: 17.0000 g | PACK | Freq: Every day | ORAL | Status: DC | PRN

## 2012-08-13 MED ORDER — ACETAMINOPHEN 325 MG PO TABS: 650.0000 mg | ORAL_TABLET | Freq: Four times a day (QID) | ORAL | Status: DC | PRN

## 2012-08-13 MED ORDER — POTASSIUM CHLORIDE CRYS ER 20 MEQ PO TBCR
40.0000 meq | EXTENDED_RELEASE_TABLET | Freq: Two times a day (BID) | ORAL | Status: AC
  Administered 2012-08-13 – 2012-08-14 (×2): 40 meq via ORAL
  Filled 2012-08-13 (×2): qty 2

## 2012-08-13 MED ORDER — DSS 100 MG PO CAPS: 100.0000 mg | ORAL_CAPSULE | Freq: Two times a day (BID) | ORAL | Status: DC

## 2012-08-13 MED ORDER — RIVAROXABAN 10 MG PO TABS: 10.0000 mg | ORAL_TABLET | Freq: Every day | ORAL | Status: DC

## 2012-08-13 MED ORDER — METHOCARBAMOL 500 MG PO TABS: 500.0000 mg | ORAL_TABLET | Freq: Four times a day (QID) | ORAL | Status: DC | PRN

## 2012-08-13 MED ORDER — ONDANSETRON HCL 4 MG PO TABS: 4.0000 mg | ORAL_TABLET | Freq: Four times a day (QID) | ORAL | Status: DC | PRN

## 2012-08-13 MED ORDER — BISACODYL 10 MG RE SUPP: 10.0000 mg | Freq: Every day | RECTAL | Status: DC | PRN

## 2012-08-13 MED ORDER — TRAMADOL HCL 50 MG PO TABS: 50.0000 mg | ORAL_TABLET | Freq: Four times a day (QID) | ORAL | Status: DC | PRN

## 2012-08-13 NOTE — Progress Notes (Signed)
Occupational Therapy Treatment Patient Details Name: Christina Duarte MRN: [id number] DOB: Mar 08, 1946 Today's Date: 08/13/2012 Time: 9562-1308 OT Time Calculation (min): 24 min  OT Assessment / Plan / Recommendation Comments on Treatment Session      Follow Up Recommendations  Skilled nursing facility    Barriers to Discharge       Equipment Recommendations  Rolling walker with 5" wheels    Recommendations for Other Services    Frequency Min 1X/week   Plan      Precautions / Restrictions Precautions Precautions: Posterior Hip Restrictions RLE Weight Bearing: Weight bearing as tolerated   Pertinent Vitals/Pain R LE sore; repositioned    ADL  Grooming: Performed;Supervision/safety Where Assessed - Grooming: Supported standing Lower Body Dressing: Performed;Supervision/safety (underwear and socks with ae) Where Assessed - Lower Body Dressing: Supported sit to stand Toilet Transfer: Performed;Min guard Statistician Method: Sit to Barista: Raised toilet seat with arms (or 3-in-1 over toilet) Toileting - Clothing Manipulation and Hygiene: Performed;Supervision/safety Where Assessed - Engineer, mining and Hygiene: Standing Equipment Used: Rolling walker Transfers/Ambulation Related to ADLs: has to use arms to help RLE for first step ADL Comments: practiced with AE.  Pt wants to get a sock aid.  Min cues for sequence with reacher    OT Diagnosis:    OT Problem List:   OT Treatment Interventions:     OT Goals ADL Goals Pt Will Perform Grooming: with supervision;Standing at sink ADL Goal: Grooming - Progress: Met Pt Will Perform Lower Body Dressing: with min assist;Sit to stand from bed;Sit to stand from chair;with adaptive equipment ADL Goal: Lower Body Dressing - Progress: Met Pt Will Transfer to Toilet: with supervision;3-in-1;Raised toilet seat with arms;Ambulation ADL Goal: Toilet Transfer - Progress: Progressing toward  goals Pt Will Perform Toileting - Clothing Manipulation: with supervision;Sitting on 3-in-1 or toilet;Standing ADL Goal: Toileting - Clothing Manipulation - Progress: Met Pt Will Perform Toileting - Hygiene: with supervision;Sit to stand from 3-in-1/toilet ADL Goal: Toileting - Hygiene - Progress: Met  Visit Information  Last OT Received On: 08/13/12 Assistance Needed: +1    Subjective Data      Prior Functioning       Cognition  Overall Cognitive Status: Appears within functional limits for tasks assessed/performed Arousal/Alertness: Awake/alert Orientation Level: Appears intact for tasks assessed Behavior During Session: [facility] for tasks performed    Mobility  Shoulder Instructions Transfers Sit to Stand: 5: Supervision Details for Transfer Assistance: no vcs needed during OT       Exercises      Balance     End of Session OT - End of Session Activity Tolerance: Patient tolerated treatment well Patient left: in chair;with call bell/phone within reach  GO     Timohty Renbarger 08/13/2012, 3:57 PM Marica Otter, OTR/L [phone] 08/13/2012

## 2012-08-13 NOTE — Progress Notes (Signed)
   Subjective: 2 Days Post-Op Procedure(s) (LRB): TOTAL HIP ARTHROPLASTY (Right) Patient reports pain as mild.   Patient seen in rounds with Dr. Lequita Halt. Patient was doing much better.  Already sitting up in chair eating breakfast. Patient is well, and has had no acute complaints or problems Plan is to go Skilled nursing facility after hospital stay.  Objective: Vital signs in last 24 hours: Temp:  [97.5 F (36.4 C)-99.1 F (37.3 C)] 98.4 F (36.9 C) (10/11 0520) Pulse Rate:  [58-74] 58  (10/11 0520) Resp:  [18] 18  (10/11 0520) BP: (136-189)/(71-80) 169/71 mmHg (10/11 0520) SpO2:  [97 %-100 %] 98 % (10/11 0520)  Intake/Output from previous day:  Intake/Output Summary (Last 24 hours) at 08/13/12 0828 Last data filed at 08/13/12 0520  Gross per 24 hour  Intake    500 ml  Output    500 ml  Net      0 ml    Intake/Output this shift:    Labs:  Basename 08/13/12 0353 08/12/12 0350  HGB 12.7 12.9    Basename 08/13/12 0353 08/12/12 0350  WBC 8.0 6.6  RBC 4.51 4.55  HCT 37.7 38.1  PLT 228 203    Basename 08/13/12 0353 08/12/12 0350  NA 137 133*  K 3.2* 3.4*  CL 100 98  CO2 26 27  BUN 8 7  CREATININE 0.54 0.52  GLUCOSE 105* 163*  CALCIUM 9.1 8.9   No results found for this basename: LABPT:2,INR:2 in the last 72 hours  EXAM General - Patient is Alert, Appropriate and Oriented Extremity - Neurovascular intact Sensation intact distally Dorsiflexion/Plantar flexion intact No cellulitis present Dressing/Incision - clean, dry, no drainage, healing Motor Function - intact, moving foot and toes well on exam.   Past Medical History  Diagnosis Date  . Blood transfusion   . PVC's (premature ventricular contractions)     HX OF HEART PALPITATIONS AND PVC'S  . PVC's (premature ventricular contractions)     PT STATES IF SHE NEEDS HEART DOCTOR WHILE IN HOSP-SHE WANTS DR. PANG CALLED-HER MEDICAL DOCTOR AND DR. Allyson Sabal  . 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   Weight as of this encounter: 220 lb(99.791 kg). Advance diet Up with therapy Plan for discharge tomorrow Discharge to SNF  DVT Prophylaxis - Xarelto Weight Bearing As Tolerated right Leg  Christina Duarte 08/13/2012, 8:28 AM

## 2012-08-13 NOTE — Discharge Summary (Signed)
Physician Discharge Summary   Patient ID: Christina Duarte MRN: [id number] DOB/AGE: 1946-08-23 66 y.o.  Admit date: 08/11/2012 Discharge date: Tentative Date of Discharge - 08/14/2012  Primary Diagnosis: Osteoarthritis Right hip   Admission Diagnoses:  Past Medical History  Diagnosis Date  . Blood transfusion   . PVC's (premature ventricular contractions)     HX OF HEART PALPITATIONS AND PVC'S  . PVC's (premature ventricular contractions)     PT STATES IF SHE NEEDS HEART DOCTOR WHILE IN HOSP-SHE WANTS DR. PANG CALLED-HER MEDICAL DOCTOR AND DR. Allyson Sabal  . 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   Weight as of this encounter: 220 lb(99.791 kg).  Classification of overweight in adults according to BMI (WHO, 1998)   Procedure: Procedure(s) (LRB): TOTAL HIP ARTHROPLASTY (Right)   Consults: None  HPI: ADAMAE Duarte is a 66 y.o. female with end stage arthritis of her right hip with progressively worsening pain and dysfunction. Pain occurs with activity and rest including pain at night. She has tried analgesics, protected weight bearing and rest without benefit. Pain is too severe to attempt physical therapy. Radiographs demonstrate bone on bone arthritis with subchondral cyst formation. She presents now for right THA.  Laboratory Data: Hospital Outpatient Visit on 08/05/2012  Component Date Value Range Status  . MRSA, PCR 08/05/2012 NEGATIVE  NEGATIVE Final  . Staphylococcus aureus 08/05/2012 NEGATIVE  NEGATIVE Final   Comment:                                 The Xpert SA Assay (FDA                          approved for NASAL specimens                          in patients over 51  years of age),                          is one component of                          a comprehensive surveillance                          program.  Test performance has                          been validated by [employer] for patients greater                          than or equal to 40 year old.                          It is not intended  to diagnose infection nor to                          guide or monitor treatment.  Marland Kitchen aPTT 08/05/2012 37  24 - 37 seconds Final   Comment:                                 IF BASELINE aPTT IS ELEVATED,                          SUGGEST PATIENT RISK ASSESSMENT                          BE USED TO DETERMINE APPROPRIATE                          ANTICOAGULANT THERAPY.  . WBC 08/05/2012 3.8* 4.0 - 10.5 K/uL Final  . RBC 08/05/2012 4.89  3.87 - 5.11 MIL/uL Final  . Hemoglobin 08/05/2012 13.7  12.0 - 15.0 g/dL Final  . HCT 16/08/9603 41.0  36.0 - 46.0 % Final  . MCV 08/05/2012 83.8  78.0 - 100.0 fL Final  . MCH 08/05/2012 28.0  26.0 - 34.0 pg Final  . MCHC 08/05/2012 33.4  30.0 - 36.0 g/dL Final  . RDW 54/07/8118 14.5  11.5 - 15.5 % Final  . Platelets 08/05/2012 225  150 - 400 K/uL Final  . Sodium 08/05/2012 139  135 - 145 mEq/L Final  . Potassium 08/05/2012 4.0  3.5 - 5.1 mEq/L Final  . Chloride 08/05/2012 105  96 - 112 mEq/L Final  . CO2 08/05/2012 28  19 - 32 mEq/L Final  . Glucose, Bld 08/05/2012 82  70 - 99 mg/dL Final  . BUN 14/78/2956 13  6 - 23 mg/dL Final  . Creatinine, Ser 08/05/2012 0.60  0.50 - 1.10 mg/dL Final  . Calcium 21/30/8657 9.4  8.4 - 10.5 mg/dL Final  . Total Protein 08/05/2012 6.7  6.0 - 8.3 g/dL Final  . Albumin 84/69/6295 3.9  3.5 - 5.2 g/dL Final  . AST 28/41/3244 35  0 - 37 U/L Final  . ALT 08/05/2012 32  0 - 35 U/L Final  . Alkaline Phosphatase 08/05/2012 118* 39 - 117 U/L Final  . Total Bilirubin 08/05/2012 0.6  0.3 - 1.2 mg/dL Final  . GFR calc non Af Amer 08/05/2012 >90   >90 mL/min Final  . GFR calc Af Amer 08/05/2012 >90  >90 mL/min Final   Comment:                                 The eGFR has been calculated                          using the CKD EPI equation.                          This calculation has not been                          validated in all clinical  situations.                          eGFR's persistently                          <90 mL/min signify                          possible Chronic Kidney Disease.  Marland Kitchen Prothrombin Time 08/05/2012 13.2  11.6 - 15.2 seconds Final  . INR 08/05/2012 1.01  0.00 - 1.49 Final  . ABO/RH(D) 08/05/2012 O NEG   Final  . Antibody Screen 08/05/2012 NEG   Final  . Sample Expiration 08/05/2012 08/14/2012   Final  . Color, Urine 08/05/2012 YELLOW  YELLOW Final  . APPearance 08/05/2012 CLEAR  CLEAR Final  . Specific Gravity, Urine 08/05/2012 1.008  1.005 - 1.030 Final  . pH 08/05/2012 6.0  5.0 - 8.0 Final  . Glucose, UA 08/05/2012 NEGATIVE  NEGATIVE mg/dL Final  . Hgb urine dipstick 08/05/2012 NEGATIVE  NEGATIVE Final  . Bilirubin Urine 08/05/2012 NEGATIVE  NEGATIVE Final  . Ketones, ur 08/05/2012 NEGATIVE  NEGATIVE mg/dL Final  . Protein, ur 40/98/1191 NEGATIVE  NEGATIVE mg/dL Final  . Urobilinogen, UA 08/05/2012 0.2  0.0 - 1.0 mg/dL Final  . Nitrite 47/82/9562 NEGATIVE  NEGATIVE Final  . Leukocytes, UA 08/05/2012 TRACE* NEGATIVE Final  . Squamous Epithelial / LPF 08/05/2012 RARE  RARE Final  . WBC, UA 08/05/2012 0-2  <3 WBC/hpf Final  . Bacteria, UA 08/05/2012 RARE  RARE Final    Basename 08/13/12 0353 08/12/12 0350  HGB 12.7 12.9    Basename 08/13/12 0353 08/12/12 0350  WBC 8.0 6.6  RBC 4.51 4.55  HCT 37.7 38.1  PLT 228 203    Basename 08/13/12 0353 08/12/12 0350  NA 137 133*  K 3.2* 3.4*  CL 100 98  CO2 26 27  BUN 8 7  CREATININE 0.54 0.52  GLUCOSE 105* 163*  CALCIUM 9.1 8.9   No results found for this basename: LABPT:2,INR:2 in the last 72 hours  X-Rays:Dg  Hip Complete Right  08/05/2012  *RADIOLOGY REPORT*  Clinical Data: Preop x-ray for right total hip arthroplasty.  RIGHT HIP - COMPLETE 2+ VIEW  Comparison: None  Findings: There is severe bone on bone osteoarthritis involving the right hip.  Marked subchondral sclerosis, joint space narrowing and marginal spur formation is noted.  Mild osteoarthritis involves the left hip. No fractures or subluxations identified.  IMPRESSION:  1.  Severe right hip osteoarthritis.   Original Report Authenticated By: Rosealee Albee, M.D.    Dg Pelvis Portable  08/11/2012  *RADIOLOGY REPORT*  Clinical Data: Postop right total hip replacement  PORTABLE PELVIS  Comparison: Plain film 08/05/2012  Findings: Interval right total hip arthroplasty.  The femoral component and the acetabular component appear well seated.  No evidence dislocation.  There is degenerative change with medial joint space on the left with narrowing and sclerosis.  IMPRESSION: Right hip total arthroplasty without complication.   Original Report Authenticated By: Genevive Bi, M.D.    Dg Hip Portable 1 View Right  08/11/2012  *RADIOLOGY REPORT*  Clinical Data: Postop right hip arthroplasty  PORTABLE RIGHT HIP - 1 VIEW  Comparison: Plain film 08/04/2012  Findings: Interval right hip total arthroplasty.  Femoral component and acetabular component appear well seated.  Surgical drain is in place .  IMPRESSION: No complication  following right hip arthroplasty.   Original Report Authenticated By: Genevive Bi, M.D.     EKG: Orders placed during the hospital encounter of 08/11/12  . EKG     Hospital Course:  Patient was admitted to [facility] and taken to the OR and underwent the above state procedure without complications.  Patient tolerated the procedure well and was later transferred to the recovery room and then to the orthopaedic floor for postoperative care.  They were given PO and IV analgesics for pain control following their surgery.   They were given 24 hours of postoperative antibiotics of  Anti-infectives     Start     Dose/Rate Route Frequency Ordered Stop   08/11/12 1700   ceFAZolin (ANCEF) IVPB 2 g/50 mL premix        2 g 100 mL/hr over 30 Minutes Intravenous 4 times per day 08/11/12 1605 08/12/12 0056   08/11/12 0657   ceFAZolin (ANCEF) IVPB 2 g/50 mL premix        2 g 100 mL/hr over 30 Minutes Intravenous 60 min pre-op 08/11/12 0657 08/11/12 0815         and started on DVT prophylaxis in the form of Xarelto.   PT and OT were ordered for total hip protocol.  The patient was allowed to be WBAT with therapy. Discharge planning was consulted to help with postop disposition and equipment needs.  Patient had a fairly good night on the evening of surgery and started to get up OOB with therapy on day one.  Hemovac drain was pulled without difficulty.  The knee immobilizer was removed and discontinued.  Continued to work with therapy into day two.  Dressing was changed on day two and the incision was healing well.  Patient was seen in rounds by Dr. Lequita Halt and it was felt that the patient would likely be ready the next day, Saturday, to go to the SNF.  Plan were to transfer over the weekend once she was been seen an evaluated by the weekend coverage staff.  Plan to transfer on Saturday.   Discharge Medications: Prior to Admission medications   Medication Sig Start Date End Date Taking? Authorizing Provider  atorvastatin (LIPITOR) 40 MG tablet Take 40 mg by mouth every evening.    Yes Historical Provider, MD  metoprolol (TOPROL-XL) 50 MG 24 hr tablet Take 50 mg by mouth every evening.    Yes Historical Provider, MD  Polyethyl Glycol-Propyl Glycol (SYSTANE) 0.4-0.3 % SOLN Apply 1 drop to eye as needed. Dry eyes   Yes Historical Provider, MD  acetaminophen (TYLENOL) 325 MG tablet Take 2 tablets (650 mg total) by mouth every 6 (six) hours as needed (or Fever >/= 101). 08/13/12   Alexzandrew Julien Girt, PA  bisacodyl (DULCOLAX) 10  MG suppository Place 1 suppository (10 mg total) rectally daily as needed. 08/13/12   Alexzandrew Perkins, PA  docusate sodium 100 MG CAPS Take 100 mg by mouth 2 (two) times daily. 08/13/12   Alexzandrew Julien Girt, PA  methocarbamol (ROBAXIN) 500 MG tablet Take 1 tablet (500 mg total) by mouth every 6 (six) hours as needed. 08/13/12   Alexzandrew Perkins, PA  ondansetron (ZOFRAN) 4 MG tablet Take 1 tablet (4 mg total) by mouth every 6 (six) hours as needed for nausea. 08/13/12   Alexzandrew Perkins, PA  oxyCODONE (OXY IR/ROXICODONE) 5 MG immediate release tablet Take 1-2 tablets (5-10 mg total) by mouth every 3 (three) hours as needed. 08/13/12   Alexzandrew Julien Girt, PA  polyethylene  glycol (MIRALAX / GLYCOLAX) packet Take 17 g by mouth daily as needed. 08/13/12   Alexzandrew Julien Girt, PA  rivaroxaban (XARELTO) 10 MG TABS tablet Take 1 tablet (10 mg total) by mouth daily with breakfast. Take Xarelto for two and a half more weeks, then discontinue Xarelto. 08/13/12   Alexzandrew Julien Girt, PA  traMADol (ULTRAM) 50 MG tablet Take 1-2 tablets (50-100 mg total) by mouth every 6 (six) hours as needed (mild pain). 08/13/12   Alexzandrew Julien Girt, PA    Diet: Regular diet Activity:WBAT No bending hip over 90 degrees- A "L" Angle Do not cross legs Do not let foot roll inward When turning these patients a pillow should be placed between the patient's legs to prevent crossing. Patients should have the affected knee fully extended when trying to sit or stand from all surfaces to prevent excessive hip flexion. When ambulating and turning toward the affected side the affected leg should have the toes turned out prior to moving the walker and the rest of patient's body as to prevent internal rotation/ turning in of the leg. Abduction pillows are the most effective way to prevent a patient from not crossing legs or turning toes in at rest. If an abduction pillow is not ordered placing a regular pillow length wise  between the patient's legs is also an effective reminder. It is imperative that these precautions be maintained so that the surgical hip does not dislocate. Follow-up:in 2 weeks Disposition - Skilled nursing facility - [facility] Discharged Condition: Pending at time of Summary.  Plan to transfer Saturday if doing well.   Discharge Orders    Future Orders Please Complete By Expires   Diet general      Call MD / Call 911      Comments:   If you experience chest pain or shortness of breath, CALL 911 and be transported to the hospital emergency room.  If you develope a fever above 101 F, pus (white drainage) or increased drainage or redness at the wound, or calf pain, call your surgeon's office.   Discharge instructions      Comments:   Pick up stool softner and laxative for home. Do not submerge incision under water. May shower. Continue to use ice for pain and swelling from surgery. Hip precautions.  Total Hip Protocol.  Take Xarelto for two and a half more weeks, then discontinue Xarelto.  When discharged from the skilled rehab facility, please have the facility set up the patient's Home Health Physical Therapy prior to being released.  Also provide the patient with their medications at time of release from the facility to include their pain medication, the muscle relaxants, and their blood thinner medication.  If the patient is still at the rehab facility at time of follow up appointment, please also assist the patient in arranging follow up appointment in our office and any transportation needs.   Constipation Prevention      Comments:   Drink plenty of fluids.  Prune juice may be helpful.  You may use a stool softener, such as Colace (over the counter) 100 mg twice a day.  Use MiraLax (over the counter) for constipation as needed.   Increase activity slowly as tolerated      Patient may shower      Comments:   You may shower without a dressing once there is no drainage.  Do not  wash over the wound.  If drainage remains, do not shower until drainage stops.   Weight bearing  as tolerated      Driving restrictions      Comments:   No driving until released by the physician.   Lifting restrictions      Comments:   No lifting until released by the physician.   Follow the hip precautions as taught in Physical Therapy      Change dressing      Comments:   You may change your dressing dressing daily with sterile 4 x 4 inch gauze dressing and paper tape.  Do not submerge the incision under water.   TED hose      Comments:   Use stockings (TED hose) for 3 weeks on both leg(s).  You may remove them at night for sleeping.   Do not sit on low chairs, stoools or toilet seats, as it may be difficult to get up from low surfaces          Medication List     As of 08/13/2012 10:25 AM    STOP taking these medications         meloxicam 15 MG tablet   Commonly known as: MOBIC      multivitamin with minerals Tabs      TYLENOL ARTHRITIS PAIN PO      TAKE these medications         acetaminophen 325 MG tablet   Commonly known as: TYLENOL   Take 2 tablets (650 mg total) by mouth every 6 (six) hours as needed (or Fever >/= 101).      atorvastatin 40 MG tablet   Commonly known as: LIPITOR   Take 40 mg by mouth every evening.      bisacodyl 10 MG suppository   Commonly known as: DULCOLAX   Place 1 suppository (10 mg total) rectally daily as needed.      DSS 100 MG Caps   Take 100 mg by mouth 2 (two) times daily.      methocarbamol 500 MG tablet   Commonly known as: ROBAXIN   Take 1 tablet (500 mg total) by mouth every 6 (six) hours as needed.      metoprolol succinate 50 MG 24 hr tablet   Commonly known as: TOPROL-XL   Take 50 mg by mouth every evening.      ondansetron 4 MG tablet   Commonly known as: ZOFRAN   Take 1 tablet (4 mg total) by mouth every 6 (six) hours as needed for nausea.      oxyCODONE 5 MG immediate release tablet   Commonly known as: Oxy  IR/ROXICODONE   Take 1-2 tablets (5-10 mg total) by mouth every 3 (three) hours as needed.      polyethylene glycol packet   Commonly known as: MIRALAX / GLYCOLAX   Take 17 g by mouth daily as needed.      rivaroxaban 10 MG Tabs tablet   Commonly known as: XARELTO   Take 1 tablet (10 mg total) by mouth daily with breakfast. Take Xarelto for two and a half more weeks, then discontinue Xarelto.      SYSTANE 0.4-0.3 % Soln   Generic drug: Polyethyl Glycol-Propyl Glycol   Apply 1 drop to eye as needed. Dry eyes      traMADol 50 MG tablet   Commonly known as: ULTRAM   Take 1-2 tablets (50-100 mg total) by mouth every 6 (six) hours as needed (mild pain).           Follow-up Information    Follow up with  Loanne Drilling, MD. Schedule an appointment as soon as possible for a visit in 2 weeks.   Contact information:   [address], [address] [place] [address] [phone]          Signed: Patrica Duel 08/13/2012, 10:25 AM

## 2012-08-13 NOTE — Progress Notes (Signed)
Physical Therapy Treatment Patient Details Name: Christina Duarte MRN: [id number] DOB: 04/30/1946 Today's Date: 08/13/2012 Time: 2952-8413 PT Time Calculation (min): 32 min  PT Assessment / Plan / Recommendation Comments on Treatment Session  POD #2 Posterior THR progressing slowly. Pt plans to D/C to [facility]    Follow Up Recommendations        Does the patient have the potential to tolerate intense rehabilitation     Barriers to Discharge        Equipment Recommendations  Rolling walker with 5" wheels    Recommendations for Other Services    Frequency 7X/week   Plan Discharge plan remains appropriate    Precautions / Restrictions Precautions Precautions: Posterior Hip Precaution Comments: Pt recalled 2/3 THP so re educated Restrictions Weight Bearing Restrictions: No RLE Weight Bearing: Weight bearing as tolerated   Pertinent Vitals/Pain C/o 5/10 during TE's ICE applied    Mobility  Bed Mobility Bed Mobility: Not assessed Details for Bed Mobility Assistance: Pt OOB in recliner  Transfers Transfers: Sit to Stand;Stand to Sit Sit to Stand: 4: Min guard;From chair/3-in-1 Stand to Sit: 4: Min guard;To chair/3-in-1 Details for Transfer Assistance: <25% VC's on proper tech and hand placement.  Ambulation/Gait Ambulation/Gait Assistance: 4: Min assist Ambulation Distance (Feet):  (65 feet) Assistive device: Rolling walker Ambulation/Gait Assistance Details: increased time and 25% VC's with safety on turns Gait Pattern: Step-to pattern;Decreased stance time - right;Trunk flexed Gait velocity: decreased    Exercises Total Joint Exercises Ankle Circles/Pumps: AROM;Both;10 reps;Supine Quad Sets: AROM;Both;10 reps;Supine Gluteal Sets: AROM;Both;10 reps;Supine Towel Squeeze: AROM;Both;10 reps;Supine Short Arc Quad: AROM;Right;10 reps;Supine Heel Slides: AAROM;Right;10 reps;Supine Hip ABduction/ADduction: AAROM;Right;10 reps;Supine   PT Goals        Progressing slowly    Visit Information  Last PT Received On: 08/13/12 Assistance Needed: +1    Subjective Data  Subjective: I wish I could have a BM Patient Stated Goal: feel better   Cognition  Overall Cognitive Status: Appears within functional limits for tasks assessed/performed Arousal/Alertness: Awake/alert Orientation Level: Appears intact for tasks assessed Behavior During Session: [facility] for tasks performed    Balance   fair  End of Session PT - End of Session Equipment Utilized During Treatment: Gait belt Activity Tolerance: Patient tolerated treatment well Patient left: in chair;with call bell/phone within reach (ICE to R hip)   Felecia Shelling  PTA [facility]  Acute  Rehab Pager     [phone]

## 2012-08-14 LAB — BASIC METABOLIC PANEL
CO2: 31 mEq/L (ref 19–32)
Calcium: 9.1 mg/dL (ref 8.4–10.5)
Chloride: 100 mEq/L (ref 96–112)
Creatinine, Ser: 0.58 mg/dL (ref 0.50–1.10)
Glucose, Bld: 113 mg/dL — ABNORMAL HIGH (ref 70–99)

## 2012-08-14 LAB — CBC
Hemoglobin: 12 g/dL (ref 12.0–15.0)
MCH: 28 pg (ref 26.0–34.0)
MCV: 83.9 fL (ref 78.0–100.0)
RBC: 4.29 MIL/uL (ref 3.87–5.11)
WBC: 7.6 10*3/uL (ref 4.0–10.5)

## 2012-08-14 NOTE — Progress Notes (Signed)
40 mEq of Potassium dose missed by me last night it was scheduled for 2200 Given this morning at 0753   Unfortunately this was given AFTER BMET was drawn   Christina Duarte Annabess 7:54 AM

## 2012-08-14 NOTE — Progress Notes (Signed)
Subjective: 3 Days Post-Op Procedure(s) (LRB): TOTAL HIP ARTHROPLASTY (Right) Patient reports pain as mild.  No c/o.  Doing well with PT.  Objective: Vital signs in last 24 hours: Temp:  [98.3 F (36.8 C)-99.5 F (37.5 C)] 98.3 F (36.8 C) (10/12 0524) Pulse Rate:  [78-94] 88  (10/12 0524) Resp:  [16] 16  (10/12 1200) BP: (127-160)/(71-76) 127/76 mmHg (10/12 0524) SpO2:  [95 %-99 %] 98 % (10/12 1200)  Intake/Output from previous day: 10/11 0701 - 10/12 0700 In: 480 [P.O.:480] Out: -  Intake/Output this shift: Total I/O In: 240 [P.O.:240] Out: -    Basename 08/14/12 0423 08/13/12 0353 08/12/12 0350  HGB 12.0 12.7 12.9    Basename 08/14/12 0423 08/13/12 0353  WBC 7.6 8.0  RBC 4.29 4.51  HCT 36.0 37.7  PLT 210 228    Basename 08/14/12 0423 08/13/12 0353  NA 139 137  K 3.0* 3.2*  CL 100 100  CO2 31 26  BUN 7 8  CREATININE 0.58 0.54  GLUCOSE 113* 105*  CALCIUM 9.1 9.1   No results found for this basename: LABPT:2,INR:2 in the last 72 hours  Hip dressed and dry.  NVI R LE.  Assessment/Plan: 3 Days Post-Op Procedure(s) (LRB): TOTAL HIP ARTHROPLASTY (Right) Discharge to SNF  Deundra Furber, [facility] 08/14/2012, 12:39 PM

## 2012-08-14 NOTE — Progress Notes (Signed)
Report called to [facility], Charity fundraiser at [facility].  Pt transported via [facility] to [facility]...all paperwork sent with [facility].

## 2012-08-14 NOTE — Progress Notes (Signed)
Pt to be d/c today to [facility]   Pt and family agreeable. Confirmed plans with facility.  Plan transfer via EMS.   Irwin Toran, LCSWA [facility] [employer] [phone]   

## 2012-08-14 NOTE — Progress Notes (Signed)
Physical Therapy Treatment Patient Details Name: Christina Duarte MRN: [id number] DOB: [birth year] Today's Date: 08/14/2012 Time: 1036-1100 PT Time Calculation (min): 24 min  PT Assessment / Plan / Recommendation Comments on Treatment Session  Progressing well today with increased gait distance and less assistance needed with mobility.    Follow Up Recommendations  Post acute inpatient     Does the patient have the potential to tolerate intense rehabilitation  No, Recommend SNF     Equipment Recommendations  Rolling walker with 5" wheels       Frequency 7X/week   Plan Discharge plan remains appropriate;Frequency remains appropriate    Precautions / Restrictions Precautions Precautions: Posterior Hip Precaution Comments: Pt able to recall 3/3 post hip precautions without cues or assistance. Restrictions RLE Weight Bearing: Weight bearing as tolerated       Mobility  Transfers Sit to Stand: 5: Supervision;From chair/3-in-1;With upper extremity assist;With armrests Stand to Sit: 5: Supervision;With upper extremity assist;With armrests;To chair/3-in-1 Details for Transfer Assistance: vc x1 for hand placement with standing up  Ambulation/Gait Ambulation/Gait Assistance: 4: Min guard Ambulation Distance (Feet): 70 Feet Assistive device: Rolling walker Ambulation/Gait Assistance Details: min cues for posture and walker postion with gait, especially with turning. Gait Pattern: Step-to pattern;Decreased stance time - right;Decreased step length - left;Antalgic    Exercises Total Joint Exercises Ankle Circles/Pumps: AROM;Both;10 reps;Supine Quad Sets: AAROM;Strengthening;Right;10 reps;Supine Heel Slides: AAROM;Right;Strengthening;10 reps;Supine Hip ABduction/ADduction: AAROM;Strengthening;Right;10 reps;Supine Long Arc Quad: AROM;Strengthening;Right;10 reps;Seated    PT Goals Acute Rehab PT Goals PT Goal: Sit to Stand - Progress: Progressing toward goal PT Goal: Ambulate -  Progress: Progressing toward goal  Visit Information  Last PT Received On: 08/14/12 Assistance Needed: +1    Subjective Data  Subjective: No new complaints, agreeable to therapy today.   Cognition  Overall Cognitive Status: Appears within functional limits for tasks assessed/performed Arousal/Alertness: Awake/alert Orientation Level: Appears intact for tasks assessed Behavior During Session: [facility] for tasks performed       End of Session PT - End of Session Equipment Utilized During Treatment: Gait belt Activity Tolerance: Patient tolerated treatment well Patient left: in chair;with call bell/phone within reach Nurse Communication: Mobility status    Sallyanne Kuster 08/14/2012, 1:14 PM  Sallyanne Kuster, PTA Office- [phone]

## 2012-10-02 ENCOUNTER — Encounter (HOSPITAL_BASED_OUTPATIENT_CLINIC_OR_DEPARTMENT_OTHER): Payer: Self-pay | Admitting: *Deleted

## 2012-10-02 ENCOUNTER — Emergency Department (HOSPITAL_BASED_OUTPATIENT_CLINIC_OR_DEPARTMENT_OTHER)
Admission: EM | Admit: 2012-10-02 | Discharge: 2012-10-02 | Disposition: A | Discharge status: Home / Self Care | Payer: Federal, State, Local not specified - PPO | Attending: Emergency Medicine | Admitting: Emergency Medicine

## 2012-10-02 DIAGNOSIS — N390 Urinary tract infection, site not specified: Secondary | ICD-10-CM

## 2012-10-02 DIAGNOSIS — R300 Dysuria: Secondary | ICD-10-CM | POA: Insufficient documentation

## 2012-10-02 DIAGNOSIS — Z7901 Long term (current) use of anticoagulants: Secondary | ICD-10-CM | POA: Insufficient documentation

## 2012-10-02 DIAGNOSIS — Z8739 Personal history of other diseases of the musculoskeletal system and connective tissue: Secondary | ICD-10-CM | POA: Insufficient documentation

## 2012-10-02 DIAGNOSIS — Z79899 Other long term (current) drug therapy: Secondary | ICD-10-CM | POA: Insufficient documentation

## 2012-10-02 DIAGNOSIS — Z8679 Personal history of other diseases of the circulatory system: Secondary | ICD-10-CM | POA: Insufficient documentation

## 2012-10-02 DIAGNOSIS — Z96659 Presence of unspecified artificial knee joint: Secondary | ICD-10-CM | POA: Insufficient documentation

## 2012-10-02 DIAGNOSIS — Z96649 Presence of unspecified artificial hip joint: Secondary | ICD-10-CM | POA: Insufficient documentation

## 2012-10-02 LAB — URINALYSIS, ROUTINE W REFLEX MICROSCOPIC
Bilirubin Urine: NEGATIVE
Glucose, UA: NEGATIVE mg/dL
Ketones, ur: NEGATIVE mg/dL
Nitrite: NEGATIVE
Protein, ur: 100 mg/dL — AB
Specific Gravity, Urine: 1.008 (ref 1.005–1.030)
Urobilinogen, UA: 1 mg/dL (ref 0.0–1.0)
pH: 6 (ref 5.0–8.0)

## 2012-10-02 LAB — URINE MICROSCOPIC-ADD ON

## 2012-10-02 MED ORDER — PHENAZOPYRIDINE HCL 100 MG PO TABS
200.0000 mg | ORAL_TABLET | Freq: Once | ORAL | Status: AC
  Administered 2012-10-02: 200 mg via ORAL
  Filled 2012-10-02: qty 2

## 2012-10-02 MED ORDER — PHENAZOPYRIDINE HCL 200 MG PO TABS: 200.0000 mg | ORAL_TABLET | Freq: Three times a day (TID) | ORAL | Status: DC

## 2012-10-02 MED ORDER — CIPROFLOXACIN HCL 500 MG PO TABS
500.0000 mg | ORAL_TABLET | Freq: Once | ORAL | Status: AC
  Administered 2012-10-02: 500 mg via ORAL
  Filled 2012-10-02: qty 1

## 2012-10-02 MED ORDER — CIPROFLOXACIN HCL 500 MG PO TABS: 500.0000 mg | ORAL_TABLET | Freq: Two times a day (BID) | ORAL | Status: DC

## 2012-10-02 NOTE — ED Notes (Signed)
Pt states she has a hx of bladder infections and feels like this is one.

## 2012-10-02 NOTE — ED Provider Notes (Signed)
History     CSN: [id number]  Arrival date & time 10/02/12  2034   First MD Initiated Contact with Patient 10/02/12 2126      Chief Complaint  Patient presents with  . Urinary Frequency    (Consider location/radiation/quality/duration/timing/severity/associated sxs/prior treatment) HPI Comments: Pt with burning with urination, going frequently and slight pressure sensation.  No abd pain, no vaginal symptoms . Pt had UTI many years ago and feels similar.  She drank water and cranberry juice without relief . No fevers, chills.  No N/V/D.  No flank or back pain.    Patient is a 66 y.o. female presenting with frequency. The history is provided by the patient.  Urinary Frequency Pertinent negatives include no abdominal pain.    Past Medical History  Diagnosis Date  . Blood transfusion   . PVC's (premature ventricular contractions)     HX OF HEART PALPITATIONS AND PVC'S  . PVC's (premature ventricular contractions)     PT STATES IF SHE NEEDS HEART DOCTOR WHILE IN HOSP-SHE WANTS DR. PANG CALLED-HER MEDICAL DOCTOR AND DR. Allyson Sabal  . 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 Tonsillectomy 1964  . Tubal ligation 1981  . Joint replacement 2007     RIGHT KNEE  . Foot surgery 2012    RIGHT -BUNIONECTOMY  . Total knee arthroplasty 09/29/2011    Procedure: TOTAL KNEE ARTHROPLASTY;  Surgeon: Loanne Drilling;  Location: [facility];  Service: Orthopedics;  Laterality: Left;  . Total hip arthroplasty 08/11/2012    Procedure: TOTAL HIP ARTHROPLASTY;  Surgeon: Loanne Drilling, MD;  Location: [facility];  Service: Orthopedics;  Laterality: Right;    History reviewed. No pertinent family history.  History  Substance Use Topics  . Smoking status: Never Smoker   . Smokeless tobacco: Never Used  . Alcohol Use: No    OB History    Grav Para Term Preterm Abortions TAB SAB Ect Mult Living                   Review of Systems  Constitutional: Negative.   Gastrointestinal: Negative for nausea, vomiting, abdominal pain and diarrhea.  Genitourinary: Positive for dysuria and frequency. Negative for vaginal bleeding and vaginal discharge.  Musculoskeletal: Negative for back pain.    Allergies  Aspirin and Codeine  Home Medications   Current Outpatient Rx  Name  Route  Sig  Dispense  Refill  . ACETAMINOPHEN 325 MG PO TABS   Oral   Take 2 tablets (650 mg total) by mouth every 6 (six) hours as needed (or Fever >/= 101).   60 tablet   0   . ATORVASTATIN CALCIUM 40 MG PO TABS   Oral   Take 40 mg by mouth every evening.          Marland Kitchen BISACODYL 10 MG RE SUPP   Rectal   Place 1 suppository (10 mg total) rectally daily as needed.   12 suppository   0   . CIPROFLOXACIN HCL 500 MG PO TABS   Oral   Take 1 tablet (500 mg total) by mouth 2 (two) times daily.   10 tablet   0   . DSS 100 MG PO CAPS   Oral   Take 100 mg by mouth 2 (two) times daily.   60 capsule   0   . METHOCARBAMOL 500 MG PO TABS   Oral  Take 1 tablet (500 mg total) by mouth every 6 (six) hours as needed.   80 tablet   0   . METOPROLOL SUCCINATE ER 50 MG PO TB24   Oral   Take 50 mg by mouth every evening.          Marland Kitchen ONDANSETRON HCL 4 MG PO TABS   Oral   Take 1 tablet (4 mg total) by mouth every 6 (six) hours as needed for nausea.   40 tablet   0   . OXYCODONE HCL 5 MG PO TABS   Oral   Take 1-2 tablets (5-10 mg total) by mouth every 3 (three) hours as needed.   80 tablet   0   . PHENAZOPYRIDINE HCL 200 MG PO TABS   Oral   Take 1 tablet (200 mg total) by mouth 3 (three) times daily.   6 tablet   0   . POLYETHYL GLYCOL-PROPYL GLYCOL 0.4-0.3 % OP SOLN   Ophthalmic   Apply 1 drop to eye as needed. Dry eyes         . POLYETHYLENE GLYCOL 3350 PO PACK   Oral   Take 17 g by mouth daily as needed.   14 each   0   . RIVAROXABAN 10 MG PO TABS   Oral   Take 1 tablet (10 mg total) by mouth daily  with breakfast. Take Xarelto for two and a half more weeks, then discontinue Xarelto.   18 tablet   0   . TRAMADOL HCL 50 MG PO TABS   Oral   Take 1-2 tablets (50-100 mg total) by mouth every 6 (six) hours as needed (mild pain).   60 tablet   0     BP 158/59  Pulse 62  Temp 97.6 F (36.4 C) (Oral)  Resp 20  Ht 5\' 9"  (1.753 m)  Wt 220 lb (99.791 kg)  BMI 32.49 kg/m2  SpO2 100%  Physical Exam  Nursing note and vitals reviewed. Constitutional: She appears well-developed and well-nourished.  Non-toxic appearance. She does not have a sickly appearance. She does not appear ill. No distress.  Abdominal: Soft. Bowel sounds are normal. She exhibits no distension. There is no tenderness. There is no rebound, no guarding and no CVA tenderness.  Musculoskeletal:       Lumbar back: Normal.  Neurological: She is alert.  Skin: Skin is warm, dry and intact. No rash noted.    ED Course  Procedures (including critical care time)  Labs Reviewed  URINALYSIS, ROUTINE W REFLEX MICROSCOPIC - Abnormal; Notable for the following:    APPearance CLOUDY (*)     Hgb urine dipstick LARGE (*)     Protein, ur 100 (*)     Leukocytes, UA LARGE (*)     All other components within normal limits  URINE MICROSCOPIC-ADD ON - Abnormal; Notable for the following:    Squamous Epithelial / LPF FEW (*)     Bacteria, UA MANY (*)     All other components within normal limits   No results found.   1. Urinary tract infection     Ra sat is 100% and i interpret to be normal  MDM  Pt is nto toxic, not febrile, VS are ok, not hypotensive, abd is soft.  Pt has symptoms of UTI, UA highly suggestive of UTI.  Will treat with 5 days of cipro and give pyridium for symptoms.          Gavin Pound. Ligia Duguay, MD 10/02/12  2149 

## 2012-10-02 NOTE — Discharge Instructions (Signed)
Urinary Tract Infection Urinary tract infections (UTIs) can develop anywhere along your urinary tract. Your urinary tract is your body's drainage system for removing wastes and extra water. Your urinary tract includes two kidneys, two ureters, a bladder, and a urethra. Your kidneys are a pair of bean-shaped organs. Each kidney is about the size of your fist. They are located below your ribs, one on each side of your spine. CAUSES Infections are caused by microbes, which are microscopic organisms, including fungi, viruses, and bacteria. These organisms are so small that they can only be seen through a microscope. Bacteria are the microbes that most commonly cause UTIs. SYMPTOMS  Symptoms of UTIs may vary by age and gender of the patient and by the location of the infection. Symptoms in young women typically include a frequent and intense urge to urinate and a painful, burning feeling in the bladder or urethra during urination. Older women and men are more likely to be tired, shaky, and weak and have muscle aches and abdominal pain. A fever may mean the infection is in your kidneys. Other symptoms of a kidney infection include pain in your back or sides below the ribs, nausea, and vomiting. DIAGNOSIS To diagnose a UTI, your caregiver will ask you about your symptoms. Your caregiver also will ask to provide a urine sample. The urine sample will be tested for bacteria and white blood cells. White blood cells are made by your body to help fight infection. TREATMENT  Typically, UTIs can be treated with medication. Because most UTIs are caused by a bacterial infection, they usually can be treated with the use of antibiotics. The choice of antibiotic and length of treatment depend on your symptoms and the type of bacteria causing your infection. HOME CARE INSTRUCTIONS  If you were prescribed antibiotics, take them exactly as your caregiver instructs you. Finish the medication even if you feel better after you  have only taken some of the medication.  Drink enough water and fluids to keep your urine clear or pale yellow.  Avoid caffeine, tea, and carbonated beverages. They tend to irritate your bladder.  Empty your bladder often. Avoid holding urine for long periods of time.  Empty your bladder before and after sexual intercourse.  After a bowel movement, women should cleanse from front to back. Use each tissue only once. SEEK MEDICAL CARE IF:   You have back pain.  You develop a fever.  Your symptoms do not begin to resolve within 3 days. SEEK IMMEDIATE MEDICAL CARE IF:   You have severe back pain or lower abdominal pain.  You develop chills.  You have nausea or vomiting.  You have continued burning or discomfort with urination. MAKE SURE YOU:   Understand these instructions.  Will watch your condition.  Will get help right away if you are not doing well or get worse. Document Released: 07/30/2005 Document Revised: 04/20/2012 Document Reviewed: 11/28/2011 Exit[employer] Patient Information 2013 [employer].  

## 2012-10-04 LAB — URINE CULTURE: Colony Count: 50000

## 2012-10-05 NOTE — ED Notes (Signed)
+   urine Patient treated with Cipro-sensitive to same-chart appended per protocol MD. 

## 2012-12-30 ENCOUNTER — Encounter: Payer: Self-pay | Admitting: Gastroenterology

## 2013-01-19 ENCOUNTER — Encounter: Payer: Self-pay | Admitting: Podiatry

## 2013-01-20 ENCOUNTER — Ambulatory Visit: Payer: Self-pay | Admitting: Podiatry

## 2013-01-20 DIAGNOSIS — M2010 Hallux valgus (acquired), unspecified foot: Secondary | ICD-10-CM

## 2013-01-20 HISTORY — PX: BUNIONECTOMY: SHX129

## 2013-01-24 ENCOUNTER — Encounter: Payer: Self-pay | Admitting: *Deleted

## 2013-01-24 ENCOUNTER — Ambulatory Visit (INDEPENDENT_AMBULATORY_CARE_PROVIDER_SITE_OTHER): Payer: Federal, State, Local not specified - PPO | Admitting: Podiatry

## 2013-01-24 VITALS — BP 111/76 | HR 64 | Temp 96.1°F | Ht 69.5 in | Wt 224.0 lb

## 2013-01-24 DIAGNOSIS — M21619 Bunion of unspecified foot: Secondary | ICD-10-CM

## 2013-01-24 DIAGNOSIS — M2012 Hallux valgus (acquired), left foot: Secondary | ICD-10-CM

## 2013-01-24 DIAGNOSIS — M2010 Hallux valgus (acquired), unspecified foot: Secondary | ICD-10-CM

## 2013-01-24 NOTE — Progress Notes (Signed)
Subjective: First post op visit, S/P Mcbride bunionectomy left foot surgery. Patient denies having chills or fever, or calf tenderness. Stated that Christina Duarte hardly had any pain and did not require any pain medication after the 2nd day. Christina Duarte took shower and got the bandage wet this morning.   Objective: Surgical dressing is damp, but well maintained. Surgical wound is clean and dry.  No edema or erythema noted.  Post op X-ray of the left foot show reduced deformity of the first metatarsal head. No other acute changes seen.  Finding is consistent with the surgery performed.   Assessment: S/P McBride bunionectomy left foot. Satisfactory wound healing without complications.  Plan: Dressing change done. Continue with surgical shoes another week, and ambulate as tolerated. Return in one week for suture removal.

## 2013-01-24 NOTE — Patient Instructions (Addendum)
Seen for first post op visit. Has no swelling or signs of complication. Range of motion is good. Incision is healing well. Today post op x-rays taken, wound cleansed with Iodine solution, dressings changed. Continue to wear surgical shoes while up and around.  Return in one week for suture removal.

## 2013-01-25 ENCOUNTER — Encounter: Payer: Self-pay | Admitting: Podiatry

## 2013-01-27 ENCOUNTER — Encounter: Payer: Self-pay | Admitting: Podiatry

## 2013-01-27 ENCOUNTER — Ambulatory Visit (INDEPENDENT_AMBULATORY_CARE_PROVIDER_SITE_OTHER): Payer: Federal, State, Local not specified - PPO | Admitting: Podiatry

## 2013-01-27 VITALS — BP 130/74 | HR 60 | Ht 69.0 in | Wt 228.0 lb

## 2013-01-27 DIAGNOSIS — Z9889 Other specified postprocedural states: Secondary | ICD-10-CM

## 2013-01-27 NOTE — Progress Notes (Signed)
1 week post op. Patient came in today because dressing got loose. No other problem. Wound is clean under loose gauze dressing. Outer Ace bandage was intact. Wound cleansed with Iodine and reapplied clean dressing. Return next week as scheduled.

## 2013-01-27 NOTE — Patient Instructions (Signed)
No new findings. Loose bandage changed with clean new bandage. Return on scheduled date.

## 2013-01-31 ENCOUNTER — Ambulatory Visit (INDEPENDENT_AMBULATORY_CARE_PROVIDER_SITE_OTHER): Payer: Federal, State, Local not specified - PPO | Admitting: Podiatry

## 2013-01-31 ENCOUNTER — Encounter: Payer: Self-pay | Admitting: Podiatry

## 2013-01-31 VITALS — BP 158/98 | HR 99 | Ht 69.0 in | Wt 228.0 lb

## 2013-01-31 DIAGNOSIS — Z9889 Other specified postprocedural states: Secondary | ICD-10-CM | POA: Insufficient documentation

## 2013-01-31 NOTE — Progress Notes (Signed)
Status post left bunionectomy. Patient ambulates in surgical shoes, denies any discomfort. Wound is dry and healed well without edema or erythema. Correction maintained. Sutures removed. Ok to shower and regular shoes as tolerated. Forefoot compression stockinet dispensed in place of Ace bandage. Return in 2 weeks for final check up.

## 2013-01-31 NOTE — Patient Instructions (Addendum)
Sutures removed today. Ok to take shower. May use surgical shoes as needed. Return in 2 weeks for follow up.

## 2013-02-01 ENCOUNTER — Ambulatory Visit (AMBULATORY_SURGERY_CENTER): Payer: Medicare Other | Admitting: *Deleted

## 2013-02-01 VITALS — Ht 69.0 in | Wt 228.0 lb

## 2013-02-01 DIAGNOSIS — Z1211 Encounter for screening for malignant neoplasm of colon: Secondary | ICD-10-CM

## 2013-02-01 MED ORDER — NA SULFATE-K SULFATE-MG SULF 17.5-3.13-1.6 GM/177ML PO SOLN: ORAL | Status: DC

## 2013-02-14 ENCOUNTER — Ambulatory Visit (INDEPENDENT_AMBULATORY_CARE_PROVIDER_SITE_OTHER): Payer: Federal, State, Local not specified - PPO | Admitting: Podiatry

## 2013-02-14 ENCOUNTER — Encounter: Payer: Self-pay | Admitting: Podiatry

## 2013-02-14 VITALS — BP 120/74 | HR 53 | Ht 69.0 in | Wt 224.0 lb

## 2013-02-14 DIAGNOSIS — Z9889 Other specified postprocedural states: Secondary | ICD-10-CM

## 2013-02-14 NOTE — Progress Notes (Signed)
S:  3 and a half week since McBride bunionectomy left foot. Patient is wearing regular shoes without complaints. O:  Wound has healed well. Incision has well coapted without excess scar. No edema or problem noted at the forefoot.  A:  Satisfactory progress from bunionectomy left foot. P:  Advised to use toe spreader at the first interdigital space to keep the great toe from contracting back to valgus position.  Dispensed Tube foam gauze with added felt pad x 2. Return as needed.

## 2013-02-15 ENCOUNTER — Encounter: Payer: Self-pay | Admitting: Gastroenterology

## 2013-02-15 ENCOUNTER — Ambulatory Visit (AMBULATORY_SURGERY_CENTER): Payer: Federal, State, Local not specified - PPO | Admitting: Gastroenterology

## 2013-02-15 VITALS — BP 136/73 | HR 59 | Temp 97.7°F | Resp 18 | Ht 69.0 in | Wt 228.0 lb

## 2013-02-15 DIAGNOSIS — Z1211 Encounter for screening for malignant neoplasm of colon: Secondary | ICD-10-CM

## 2013-02-15 DIAGNOSIS — K5730 Diverticulosis of large intestine without perforation or abscess without bleeding: Secondary | ICD-10-CM

## 2013-02-15 MED ORDER — SODIUM CHLORIDE 0.9 % IV SOLN: 500.0000 mL | INTRAVENOUS | Status: DC

## 2013-02-15 NOTE — Op Note (Signed)
[facility] [address] [place], [address]   COLONOSCOPY PROCEDURE REPORT  PATIENT: Christina Duarte, Christina Duarte  MR#: [id number] BIRTHDATE: 18-Oct-1946 , 66  yrs. old GENDER: Female ENDOSCOPIST: Louis Meckel, MD REFERRED [id number] Ricki Miller, M.D. PROCEDURE DATE:  02/15/2013 PROCEDURE:   Colonoscopy, diagnostic ASA CLASS:   Class II INDICATIONS:average risk screening. MEDICATIONS: MAC sedation, administered by CRNA and propofol (Diprivan) 200mg  IV  DESCRIPTION OF PROCEDURE:   After the risks benefits and alternatives of the procedure were thoroughly explained, informed consent was obtained.  A digital rectal exam revealed no abnormalities of the rectum.   The LB CF-[id number]  endoscope was introduced through the anus and advanced to the cecum, which was identified by both the appendix and ileocecal valve. No adverse events experienced.   The quality of the prep was Suprep excellent The instrument was then slowly withdrawn as the colon was fully examined.      COLON FINDINGS: Mild diverticulosis was noted in the sigmoid colon. The colon mucosa was otherwise normal.  Retroflexed views revealed no abnormalities. The time to cecum=2 minutes 38 seconds. Withdrawal time=6 minutes 04 seconds.  The scope was withdrawn and the procedure completed. COMPLICATIONS: There were no complications.  ENDOSCOPIC IMPRESSION: 1.   Mild diverticulosis was noted in the sigmoid colon 2.   The colon mucosa was otherwise normal  RECOMMENDATIONS: Continue current colorectal screening recommendations for "routine risk" patients with a repeat colonoscopy in 10 years.   eSigned:  Louis Meckel, MD 02/15/2013 11:01 AM   cc:

## 2013-02-15 NOTE — Patient Instructions (Addendum)
Discharge instructions given with verbal understanding. Handouts on diverticulosis and a high fiber diet given. Resume previous medications.YOU HAD AN ENDOSCOPIC PROCEDURE TODAY AT THE [facility]: Refer to the procedure report that was given to you for any specific questions about what was found during the examination.  If the procedure report does not answer your questions, please call your gastroenterologist to clarify.  If you requested that your care partner not be given the details of your procedure findings, then the procedure report has been included in a sealed envelope for you to review at your convenience later.  YOU SHOULD EXPECT: Some feelings of bloating in the abdomen. Passage of more gas than usual.  Walking can help get rid of the air that was put into your GI tract during the procedure and reduce the bloating. If you had a lower endoscopy (such as a colonoscopy or flexible sigmoidoscopy) you may notice spotting of blood in your stool or on the toilet paper. If you underwent a bowel prep for your procedure, then you may not have a normal bowel movement for a few days.  DIET: Your first meal following the procedure should be a light meal and then it is ok to progress to your normal diet.  A half-sandwich or bowl of soup is an example of a good first meal.  Heavy or fried foods are harder to digest and may make you feel nauseous or bloated.  Likewise meals heavy in dairy and vegetables can cause extra gas to form and this can also increase the bloating.  Drink plenty of fluids but you should avoid alcoholic beverages for 24 hours.  ACTIVITY: Your care partner should take you home directly after the procedure.  You should plan to take it easy, moving slowly for the rest of the day.  You can resume normal activity the day after the procedure however you should NOT DRIVE or use heavy machinery for 24 hours (because of the sedation medicines used during the test).    SYMPTOMS TO  REPORT IMMEDIATELY: A gastroenterologist can be reached at any hour.  During normal business hours, 8:30 AM to 5:00 PM Monday through Friday, call [phone].  After hours and on weekends, please call the GI answering service at [phone] who will take a message and have the physician on call contact you.   Following lower endoscopy (colonoscopy or flexible sigmoidoscopy):  Excessive amounts of blood in the stool  Significant tenderness or worsening of abdominal pains  Swelling of the abdomen that is new, acute  Fever of 100F or higher  FOLLOW UP: If any biopsies were taken you will be contacted by phone or by letter within the next 1-3 weeks.  Call your gastroenterologist if you have not heard about the biopsies in 3 weeks.  Our staff will call the home number listed on your records the next business day following your procedure to check on you and address any questions or concerns that you may have at that time regarding the information given to you following your procedure. This is a courtesy call and so if there is no answer at the home number and we have not heard from you through the emergency physician on call, we will assume that you have returned to your regular daily activities without incident.  SIGNATURES/CONFIDENTIALITY: You and/or your care partner have signed paperwork which will be entered into your electronic medical record.  These signatures attest to the fact that that the information above on your   After Visit Summary has been reviewed and is understood.  Full responsibility of the confidentiality of this discharge information lies with you and/or your care-partner.   

## 2013-02-15 NOTE — Progress Notes (Signed)
Patient did not experience any of the following events: a burn prior to discharge; a fall within the facility; wrong site/side/patient/procedure/implant event; or a hospital transfer or hospital admission upon discharge from the facility. (G8907) Patient did not have preoperative order for IV antibiotic SSI prophylaxis. (G8918)  

## 2013-02-16 ENCOUNTER — Telehealth: Payer: Self-pay

## 2013-02-16 NOTE — Telephone Encounter (Signed)
  Follow up Call-  Call back number 02/15/2013  Post procedure Call Back phone  # [phone]  Permission to leave phone message Yes     Patient questions:  Do you have a fever, pain , or abdominal swelling? no Pain Score  0 *  Have you tolerated food without any problems? yes  Have you been able to return to your normal activities? yes  Do you have any questions about your discharge instructions: Diet   no Medications  no Follow up visit  no  Do you have questions or concerns about your Care? no  Actions: * If pain score is 4 or above: No action needed, pain <4.

## 2013-02-17 ENCOUNTER — Encounter: Payer: Self-pay | Admitting: Gastroenterology

## 2013-05-25 ENCOUNTER — Other Ambulatory Visit: Payer: Self-pay | Admitting: Obstetrics and Gynecology

## 2013-05-30 ENCOUNTER — Other Ambulatory Visit: Payer: Self-pay | Admitting: Obstetrics and Gynecology

## 2013-05-30 DIAGNOSIS — R928 Other abnormal and inconclusive findings on diagnostic imaging of breast: Secondary | ICD-10-CM

## 2013-06-14 ENCOUNTER — Ambulatory Visit
Admission: RE | Admit: 2013-06-14 | Discharge: 2013-06-14 | Disposition: A | Discharge status: Home / Self Care | Payer: Federal, State, Local not specified - PPO | Source: Ambulatory Visit | Attending: Obstetrics and Gynecology | Admitting: Obstetrics and Gynecology

## 2013-06-14 ENCOUNTER — Other Ambulatory Visit: Payer: Self-pay | Admitting: Obstetrics and Gynecology

## 2013-06-14 DIAGNOSIS — R928 Other abnormal and inconclusive findings on diagnostic imaging of breast: Secondary | ICD-10-CM

## 2013-06-24 ENCOUNTER — Ambulatory Visit
Admission: RE | Admit: 2013-06-24 | Discharge: 2013-06-24 | Disposition: A | Discharge status: Home / Self Care | Payer: Federal, State, Local not specified - PPO | Source: Ambulatory Visit | Attending: Obstetrics and Gynecology | Admitting: Obstetrics and Gynecology

## 2013-06-24 ENCOUNTER — Other Ambulatory Visit: Payer: Self-pay | Admitting: Obstetrics and Gynecology

## 2013-06-24 DIAGNOSIS — R928 Other abnormal and inconclusive findings on diagnostic imaging of breast: Secondary | ICD-10-CM

## 2013-08-25 ENCOUNTER — Encounter (HOSPITAL_COMMUNITY): Payer: Self-pay

## 2013-09-01 ENCOUNTER — Other Ambulatory Visit: Payer: Self-pay | Admitting: Obstetrics and Gynecology

## 2013-09-07 ENCOUNTER — Inpatient Hospital Stay (HOSPITAL_COMMUNITY)
Admission: RE | Admit: 2013-09-07 | Discharge: 2013-09-07 | Disposition: A | Discharge status: Home / Self Care | Payer: Federal, State, Local not specified - PPO | Source: Ambulatory Visit

## 2013-09-07 NOTE — Pre-Procedure Instructions (Signed)
Patient no showed for PAT appt at 1130 am today.  Pam in Admissions called and LMOM but no response.

## 2013-09-09 ENCOUNTER — Encounter (HOSPITAL_COMMUNITY): Payer: Self-pay

## 2013-09-09 ENCOUNTER — Encounter (HOSPITAL_COMMUNITY)
Admission: RE | Admit: 2013-09-09 | Discharge: 2013-09-09 | Disposition: A | Discharge status: Home / Self Care | Payer: Medicare Other | Source: Ambulatory Visit | Attending: Obstetrics and Gynecology | Admitting: Obstetrics and Gynecology

## 2013-09-09 DIAGNOSIS — Z01818 Encounter for other preprocedural examination: Secondary | ICD-10-CM | POA: Insufficient documentation

## 2013-09-09 DIAGNOSIS — Z01810 Encounter for preprocedural cardiovascular examination: Secondary | ICD-10-CM | POA: Insufficient documentation

## 2013-09-09 DIAGNOSIS — Z01812 Encounter for preprocedural laboratory examination: Secondary | ICD-10-CM | POA: Insufficient documentation

## 2013-09-09 HISTORY — DX: Adverse effect of unspecified anesthetic, initial encounter: T41.45XA

## 2013-09-09 HISTORY — DX: Other complications of anesthesia, initial encounter: T88.59XA

## 2013-09-09 HISTORY — DX: Personal history of other venous thrombosis and embolism: Z86.718

## 2013-09-09 LAB — CBC
HCT: 42.6 % (ref 36.0–46.0)
Hemoglobin: 14.6 g/dL (ref 12.0–15.0)
MCH: 29.1 pg (ref 26.0–34.0)
MCHC: 34.3 g/dL (ref 30.0–36.0)
RBC: 5.01 MIL/uL (ref 3.87–5.11)

## 2013-09-09 LAB — BASIC METABOLIC PANEL
BUN: 11 mg/dL (ref 6–23)
CO2: 30 mEq/L (ref 19–32)
Chloride: 101 mEq/L (ref 96–112)
GFR calc non Af Amer: 87 mL/min — ABNORMAL LOW (ref >90)
Glucose, Bld: 80 mg/dL (ref 70–99)
Potassium: 4.2 mEq/L (ref 3.5–5.1)
Sodium: 138 mEq/L (ref 135–145)

## 2013-09-09 NOTE — Patient Instructions (Addendum)
Your procedure is scheduled on: 09/13/2013  Enter through the Main Entrance of [facility] at: 1215PM  Pick up the phone at the desk and dial [phone].  Call this number if you have problems the morning of surgery: [phone].  Remember: Do NOT eat food:  AFTER MIDNIGHT 09/12/2013 Do NOT drink clear liquids after: AFTER 0815 AM 09/13/2013 Take these medicines the morning of surgery with a SIP OF WATER: NONE  Do NOT wear jewelry (body piercing), make-up, or nail polish. Do NOT wear lotions, powders, or perfumes.  You may wear deoderant. Do NOT shave for 48 hours prior to surgery. Do NOT bring valuables to the hospital. Contacts, dentures, or bridgework may not be worn into surgery. Have a responsible adult drive you home and stay with you for 24 hours after your procedure

## 2013-09-12 NOTE — H&P (Signed)
Christina Duarte is an 67 y.o. female.with history of post menopausal bleeding. Out patient biopsy was benign but the bleeding continues . Now for outpatient D&C and hysteroscopy to insure no neoplasia exists and to see if anatomic cause for bleeding exists other than atrophy.Ultrasound revealed thickened endometrial stripe.   Menstrual History:  No LMP recorded. Patient is postmenopausal.    Past Medical History  Diagnosis Date  . Blood transfusion   . PVC's (premature ventricular contractions)     HX OF HEART PALPITATIONS AND PVC'S  . PVC's (premature ventricular contractions)     PT STATES IF SHE NEEDS HEART DOCTOR WHILE IN HOSP-SHE WANTS DR. PANG CALLED-HER MEDICAL DOCTOR AND DR. Allyson Sabal  . 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 Complication of anesthesia     woke up during colonoscopy  . H/O blood clots     RIGHT KNEE    Past Surgical History  Procedure Laterality Date  . Tonsillectomy  1964  . Tubal ligation  1981  . Joint replacement  2007     RIGHT KNEE  . Foot surgery  2012    RIGHT -BUNIONECTOMY  . Total knee arthroplasty  09/29/2011    Procedure: TOTAL KNEE ARTHROPLASTY;  Surgeon: Loanne Drilling;  Location: [facility];  Service: Orthopedics;  Laterality: Left;  . Total hip arthroplasty  08/11/2012    Procedure: TOTAL HIP ARTHROPLASTY;  Surgeon: Loanne Drilling, MD;  Location: [facility];  Service: Orthopedics;  Laterality: Right;  . Hammer toe repair Right 02/17/11  . Bunionectomy Right 02/17/11    McBride  . Bunionectomy Right 01/20/13    McBride  . Colonoscopy      Family History  Problem Relation Age of Onset  . Colon cancer Neg Hx     Social History:  reports that she has never smoked. She has never used smokeless tobacco. She reports that she does not drink alcohol or use illicit drugs.  Allergies:  Allergies  Allergen Reactions  . Aspirin     Pt cant take when given with codeine. Makes her feel loopy Can  take aspirin alone w/o any problem.  . Codeine     Pt cant take when given with aspirin    No prescriptions prior to admission    ROS  Respiratory: no shortness of breath or cough Cardiac: History of palpitations no chest pain GI: No nausea, vomiting or diarrhea. Gyn: Positive bleeding no discharge itch or pelvic pain  There were no vitals taken for this visit. Physical Exam  Head: Normocephalic and a atraumatic Neck:supple no JVD no increaded thyroid Chest: Clear to P&A Heart: Regular rhythum Abdomen soft non tender no masses felt Gyn: External genitalia within normal limits          BUS: WNL          Vagina: no lesions seen           Cervix: without lesion          Uterus: Top normal size non tender          Adnexa: no masses felt  No results found for this or any previous visit (from the past 24 hour(s)).  No results found.  Assessment/Plan:  Post Menopausal bleeding Plan: D&C and hysteroscopy.   Risk and benefits have been discussed and informed consent has been obtained.  Crickett Abbett 09/12/2013, 9:43 AM

## 2013-09-13 ENCOUNTER — Encounter (HOSPITAL_COMMUNITY): Payer: Self-pay | Admitting: Anesthesiology

## 2013-09-13 ENCOUNTER — Encounter (HOSPITAL_COMMUNITY)
Admission: RE | Disposition: A | Discharge status: Home / Self Care | Payer: Self-pay | Source: Ambulatory Visit | Attending: Obstetrics and Gynecology

## 2013-09-13 ENCOUNTER — Encounter (HOSPITAL_COMMUNITY): Payer: Federal, State, Local not specified - PPO | Admitting: Anesthesiology

## 2013-09-13 ENCOUNTER — Ambulatory Visit (HOSPITAL_COMMUNITY): Payer: Federal, State, Local not specified - PPO | Admitting: Anesthesiology

## 2013-09-13 ENCOUNTER — Ambulatory Visit (HOSPITAL_COMMUNITY)
Admission: RE | Admit: 2013-09-13 | Discharge: 2013-09-13 | Disposition: A | Discharge status: Home / Self Care | Payer: Federal, State, Local not specified - PPO | Source: Ambulatory Visit | Attending: Obstetrics and Gynecology | Admitting: Obstetrics and Gynecology

## 2013-09-13 DIAGNOSIS — N840 Polyp of corpus uteri: Secondary | ICD-10-CM | POA: Insufficient documentation

## 2013-09-13 DIAGNOSIS — N950 Postmenopausal bleeding: Secondary | ICD-10-CM | POA: Insufficient documentation

## 2013-09-13 HISTORY — PX: HYSTEROSCOPY WITH D & C: SHX1775

## 2013-09-13 SURGERY — DILATATION AND CURETTAGE /HYSTEROSCOPY
Anesthesia: General | Wound class: Clean Contaminated

## 2013-09-13 MED ORDER — LIDOCAINE HCL 1 % IJ SOLN
INTRAMUSCULAR | Status: AC
  Filled 2013-09-13: qty 20

## 2013-09-13 MED ORDER — LIDOCAINE HCL 1 % IJ SOLN
INTRAMUSCULAR | Status: DC | PRN
  Administered 2013-09-13: 10 mL

## 2013-09-13 MED ORDER — SODIUM CHLORIDE 0.9 % IJ SOLN
INTRAMUSCULAR | Status: AC
  Filled 2013-09-13: qty 10

## 2013-09-13 MED ORDER — GLYCINE 1.5 % IR SOLN
Status: DC | PRN
  Administered 2013-09-13: 3000 mL

## 2013-09-13 MED ORDER — MEPERIDINE HCL 25 MG/ML IJ SOLN: 6.2500 mg | INTRAMUSCULAR | Status: DC | PRN

## 2013-09-13 MED ORDER — ONDANSETRON HCL 4 MG/2ML IJ SOLN
INTRAMUSCULAR | Status: AC
  Filled 2013-09-13: qty 2

## 2013-09-13 MED ORDER — LACTATED RINGERS IV SOLN
INTRAVENOUS | Status: DC
  Administered 2013-09-13 (×2): via INTRAVENOUS

## 2013-09-13 MED ORDER — DIPHENHYDRAMINE HCL 50 MG/ML IJ SOLN
INTRAMUSCULAR | Status: AC
  Filled 2013-09-13: qty 1

## 2013-09-13 MED ORDER — KETOROLAC TROMETHAMINE 30 MG/ML IJ SOLN
INTRAMUSCULAR | Status: AC
  Administered 2013-09-13: 15 mg
  Filled 2013-09-13: qty 1

## 2013-09-13 MED ORDER — ONDANSETRON HCL 4 MG/2ML IJ SOLN
INTRAMUSCULAR | Status: DC | PRN
  Administered 2013-09-13: 4 mg via INTRAVENOUS

## 2013-09-13 MED ORDER — METOCLOPRAMIDE HCL 5 MG/ML IJ SOLN: 10.0000 mg | Freq: Once | INTRAMUSCULAR | Status: DC | PRN

## 2013-09-13 MED ORDER — LIDOCAINE HCL (CARDIAC) 20 MG/ML IV SOLN
INTRAVENOUS | Status: AC
  Filled 2013-09-13: qty 5

## 2013-09-13 MED ORDER — PROPOFOL 10 MG/ML IV EMUL
INTRAVENOUS | Status: AC
  Filled 2013-09-13: qty 20

## 2013-09-13 MED ORDER — KETOROLAC TROMETHAMINE 15 MG/ML IJ SOLN
15.0000 mg | Freq: Once | INTRAMUSCULAR | Status: DC
  Filled 2013-09-13: qty 1

## 2013-09-13 MED ORDER — DIPHENHYDRAMINE HCL 50 MG/ML IJ SOLN
INTRAMUSCULAR | Status: DC | PRN
  Administered 2013-09-13: 12.5 mg via INTRAVENOUS

## 2013-09-13 MED ORDER — FENTANYL CITRATE 0.05 MG/ML IJ SOLN
INTRAMUSCULAR | Status: AC
  Filled 2013-09-13: qty 2

## 2013-09-13 MED ORDER — FENTANYL CITRATE 0.05 MG/ML IJ SOLN
INTRAMUSCULAR | Status: DC | PRN
  Administered 2013-09-13 (×2): 50 ug via INTRAVENOUS

## 2013-09-13 MED ORDER — FENTANYL CITRATE 0.05 MG/ML IJ SOLN: 25.0000 ug | INTRAMUSCULAR | Status: DC | PRN

## 2013-09-13 MED ORDER — PROPOFOL 10 MG/ML IV BOLUS
INTRAVENOUS | Status: DC | PRN
  Administered 2013-09-13: 170 mg via INTRAVENOUS

## 2013-09-13 SURGICAL SUPPLY — 17 items
ABLATOR ENDOMETRIAL BIPOLAR (ABLATOR) IMPLANT
CANISTER SUCT 3000ML (MISCELLANEOUS) ×2 IMPLANT
CATH ROBINSON RED A/P 16FR (CATHETERS) ×2 IMPLANT
CLOTH BEACON ORANGE TIMEOUT ST (SAFETY) ×2 IMPLANT
CONTAINER PREFILL 10% NBF 60ML (FORM) ×4 IMPLANT
DRESSING TELFA 8X3 (GAUZE/BANDAGES/DRESSINGS) ×2 IMPLANT
ELECT REM PT RETURN 9FT ADLT (ELECTROSURGICAL)
ELECTRODE REM PT RTRN 9FT ADLT (ELECTROSURGICAL) IMPLANT
GLOVE BIO SURGEON STRL SZ7.5 (GLOVE) ×2 IMPLANT
GLOVE BIOGEL PI IND STRL 7.5 (GLOVE) ×1 IMPLANT
GLOVE BIOGEL PI INDICATOR 7.5 (GLOVE) ×1
GOWN STRL REIN XL XLG (GOWN DISPOSABLE) ×4 IMPLANT
LOOP ANGLED CUTTING 22FR (CUTTING LOOP) IMPLANT
PACK HYSTEROSCOPY LF (CUSTOM PROCEDURE TRAY) ×2 IMPLANT
PAD OB MATERNITY 4.3X12.25 (PERSONAL CARE ITEMS) ×2 IMPLANT
TOWEL OR 17X24 6PK STRL BLUE (TOWEL DISPOSABLE) ×4 IMPLANT
WATER STERILE IRR 1000ML POUR (IV SOLUTION) ×2 IMPLANT

## 2013-09-13 NOTE — Transfer of Care (Signed)
Immediate Anesthesia Transfer of Care Note  Patient: Christina Duarte  Procedure(s) Performed: Procedure(s): DILATATION AND CURETTAGE /HYSTEROSCOPY (N/A)  Patient Location: PACU  Anesthesia Type:General  Level of Consciousness: awake, sedated and patient cooperative  Airway & Oxygen Therapy: Patient Spontanous Breathing and Patient connected to nasal cannula oxygen  Post-op Assessment: Report given to PACU RN and Post -op Vital signs reviewed and stable  Post vital signs: Reviewed and stable  Complications: No apparent anesthesia complications and Patient re-intubated

## 2013-09-13 NOTE — Anesthesia Preprocedure Evaluation (Addendum)
Anesthesia Evaluation  Patient identified by MRN, date of birth, ID band Patient awake    Reviewed: Allergy & Precautions, H&P , NPO status , Patient's Chart, lab work & pertinent test results  History of Anesthesia Complications (+) history of anesthetic complications  Airway Mallampati: II TM Distance: >3 FB Neck ROM: Full    Dental no notable dental hx. (+) Teeth Intact   Pulmonary neg pulmonary ROS,  breath sounds clear to auscultation  Pulmonary exam normal       Cardiovascular + dysrhythmias Rhythm:Regular Rate:Normal  Hx/o PVC's   Neuro/Psych negative neurological ROS  negative psych ROS   GI/Hepatic negative GI ROS, Neg liver ROS,   Endo/Other  negative endocrine ROSHyperlipidemia  Renal/GU negative Renal ROS  negative genitourinary   Musculoskeletal  (+) Arthritis -, Osteoarthritis,    Abdominal   Peds  Hematology negative hematology ROS (+)   Anesthesia Other Findings   Reproductive/Obstetrics Post Menopausal bleeding                          Anesthesia Physical Anesthesia Plan  ASA: II  Anesthesia Plan: General   Post-op Pain Management:    Induction: Intravenous  Airway Management Planned: LMA  Additional Equipment:   Intra-op Plan:   Post-operative Plan: Extubation in OR  Informed Consent: I have reviewed the patients History and Physical, chart, labs and discussed the procedure including the risks, benefits and alternatives for the proposed anesthesia with the patient or authorized representative who has indicated his/her understanding and acceptance.   Dental advisory given  Plan Discussed with: CRNA, Anesthesiologist and Surgeon  Anesthesia Plan Comments:        Anesthesia Quick Evaluation

## 2013-09-13 NOTE — Brief Op Note (Signed)
09/13/2013  2:04 PM  PATIENT:  Christina Duarte  67 y.o. female  PRE-OPERATIVE DIAGNOSIS:  POST MENOPAUSAL BLEEDING  POST-OPERATIVE DIAGNOSIS:  POST MENOPAUSAL BLEEDING  PROCEDURE:  Procedure(s): DILATATION AND CURETTAGE /HYSTEROSCOPY (N/A)  SURGEON:  Surgeon(s) and Role:    * Miguel Aschoff, MD - Primary     ANESTHESIA:   general  EBL:  Total I/O In: 1000 [I.V.:1000] Out: 20 [Urine:20]  BLOOD ADMINISTERED:none  DRAINS: none   LOCAL MEDICATIONS USED:  LIDOCAINE   SPECIMEN:  Source of Specimen:  endometrial currettings  DISPOSITION OF SPECIMEN:  PATHOLOGY  COUNTS:  YES  TOURNIQUET:  * No tourniquets in log *  DICTATION: .Other Dictation: Dictation Number [id number]  PLAN OF CARE: Discharge to home after PACU  PATIENT DISPOSITION:  PACU - hemodynamically stable.   Delay start of Pharmacological VTE agent (>24hrs) due to surgical blood loss or risk of bleeding: PAS hose applied

## 2013-09-14 ENCOUNTER — Encounter (HOSPITAL_COMMUNITY): Payer: Self-pay | Admitting: Obstetrics and Gynecology

## 2013-09-16 NOTE — Op Note (Signed)
NAMEGORDIE, Christina Duarte NO.:  [id number]  MEDICAL RECORD NO.:  [id number]  LOCATION:  [facility]                          FACILITY:  [facility]  PHYSICIAN:  Miguel Aschoff, M.D.       DATE OF BIRTH:  January 18, 1946  DATE OF PROCEDURE:  09/13/2013 DATE OF DISCHARGE:  09/13/2013                              OPERATIVE REPORT   PREOPERATIVE DIAGNOSIS:  POSTMENOPAUSAL BLEEDING.  POSTOPERATIVE DIAGNOSIS:  POSTMENOPAUSAL BLEEDING WITH SUBMUCOUS MYOMA.  PROCEDURE:  HYSTEROSCOPY FOLLOWED BY DILATATION AND CURETTAGE.  SURGEON:  Miguel Aschoff, M.D.  ANESTHESIA:  GENERAL.  COMPLICATIONS:  NONE.  JUSTIFICATION:  The patient is a 67 year old black female who has had the onset of vaginal bleeding.  Evaluation was made in the office for an endometrial biopsy which proved to be benign showing atrophic endometrium.  However, the patient has continued to bleed.  She is being brought to the operating room at this time to ensure that no other significant pathology had been missed and to rule out any endometrial neoplasia.  The risks and benefits of the procedure were discussed with the patient.  Informed consent has been obtained.  PROCEDURE:  The patient was taken to the operating room, placed in the supine position, and general anesthesia was administered without difficulty.  She was then placed in dorsal lithotomy position and prepped and draped in usual sterile fashion.  Examination under anesthesia revealed normal external genitalia, normal Bartholin and Skene glands, normal urethra.  She did have a grade 2 cystocele.  The cervix was without gross lesion.  The uterus was slightly enlarged and somewhat irregular due to small fibroids.  No adnexal masses were noted. At this point, a speculum was placed in the vaginal vault.  The anterior cervical lip was grasped with a tenaculum and then the endocervical canal was dilated using serial Pratt dilators until a #23 Pratt dilator could be passed.   Once this was done, the diagnostic hysteroscope was advanced through the endocervical canal.  No endocervical lesions were noted.  On entering the endometrial cavity, there was submucous myomas noted apparently arising from the left lateral posterior portion of the uterus involving the lower uterine segment.  Beyond the fibroid, the endometrial cavity was within normal limits.  No polyps or other lesions were noted once the fibroid was passed with the hysteroscope.  At this point, the scope was removed.  Polyp forceps were introduced.  A small fragment of fibroid was removed.  Following this, sharp vigorous curettage was carried out with a medium serrated curette.  The tissue obtained was sent for histologic study.  At this point, no other abnormalities being noted, it was elected to complete the procedure. The cervix was injected with total of 10 mL of 1% Xylocaine for postop analgesia.  The blood loss was minimal.  Fluid deficit was 110 mL.  The patient tolerated the procedure well and went to recovery room in satisfactory condition.  Plan is for the patient to be discharged home.  She will be seen back in 3-4 weeks for followup examination.  She is to call for any problems such as fever, pain, or heavy bleeding.  Medications for home include  tramadol 50 mg t.i.d. p.r.n. pain.     Miguel Aschoff, M.D.     AR/MEDQ  D:  09/13/2013  T:  09/14/2013  Job:  [id number]

## 2013-09-16 NOTE — Anesthesia Postprocedure Evaluation (Signed)
  Anesthesia Post-op Note  Patient: Christina Duarte  Procedure(s) Performed: Procedure(s): DILATATION AND CURETTAGE /HYSTEROSCOPY (N/A) Patient is awake and responsive. Pain and nausea are reasonably well controlled. Vital signs are stable and clinically acceptable. Oxygen saturation is clinically acceptable. There are no apparent anesthetic complications at this time. Patient is ready for discharge.

## 2013-12-06 ENCOUNTER — Other Ambulatory Visit: Payer: Self-pay | Admitting: Obstetrics and Gynecology

## 2013-12-06 DIAGNOSIS — N6320 Unspecified lump in the left breast, unspecified quadrant: Secondary | ICD-10-CM

## 2013-12-29 ENCOUNTER — Other Ambulatory Visit: Payer: Medicare Other

## 2014-01-10 ENCOUNTER — Ambulatory Visit
Admission: RE | Admit: 2014-01-10 | Discharge: 2014-01-10 | Disposition: A | Discharge status: Home / Self Care | Payer: Federal, State, Local not specified - PPO | Source: Ambulatory Visit | Attending: Obstetrics and Gynecology | Admitting: Obstetrics and Gynecology

## 2014-01-10 ENCOUNTER — Ambulatory Visit
Admission: RE | Admit: 2014-01-10 | Discharge: 2014-01-10 | Disposition: A | Discharge status: Home / Self Care | Payer: Medicare Other | Source: Ambulatory Visit | Attending: Obstetrics and Gynecology | Admitting: Obstetrics and Gynecology

## 2014-01-10 DIAGNOSIS — N6320 Unspecified lump in the left breast, unspecified quadrant: Secondary | ICD-10-CM

## 2014-05-26 ENCOUNTER — Other Ambulatory Visit: Payer: Self-pay | Admitting: Obstetrics and Gynecology

## 2014-05-29 LAB — CYTOLOGY - PAP

## 2015-07-17 ENCOUNTER — Other Ambulatory Visit: Payer: Self-pay

## 2015-07-17 DIAGNOSIS — Z1231 Encounter for screening mammogram for malignant neoplasm of breast: Secondary | ICD-10-CM

## 2015-08-15 ENCOUNTER — Other Ambulatory Visit: Payer: Self-pay

## 2015-08-15 ENCOUNTER — Encounter (HOSPITAL_BASED_OUTPATIENT_CLINIC_OR_DEPARTMENT_OTHER): Payer: Self-pay

## 2015-08-15 ENCOUNTER — Emergency Department (HOSPITAL_BASED_OUTPATIENT_CLINIC_OR_DEPARTMENT_OTHER)
Admission: EM | Admit: 2015-08-15 | Discharge: 2015-08-15 | Discharge status: Against Medical Advice | Payer: Federal, State, Local not specified - PPO | Attending: Emergency Medicine | Admitting: Emergency Medicine

## 2015-08-15 ENCOUNTER — Ambulatory Visit
Admission: RE | Admit: 2015-08-15 | Discharge: 2015-08-15 | Disposition: A | Discharge status: Home / Self Care | Payer: Federal, State, Local not specified - PPO | Source: Ambulatory Visit

## 2015-08-15 DIAGNOSIS — K0889 Other specified disorders of teeth and supporting structures: Secondary | ICD-10-CM | POA: Diagnosis not present

## 2015-08-15 DIAGNOSIS — Z1231 Encounter for screening mammogram for malignant neoplasm of breast: Secondary | ICD-10-CM

## 2015-08-15 HISTORY — DX: Palpitations: R00.2

## 2015-08-15 NOTE — ED Notes (Signed)
C/o bleeding to roof of mouth and behind two front teeth this am-denies injury-no bleeding at this time

## 2015-08-15 NOTE — ED Notes (Signed)
Pt not in ED WR when called for exam room-pt had advised reg clerk earlier that she needed to be out of ED by 2:30pm

## 2015-10-24 ENCOUNTER — Encounter (HOSPITAL_BASED_OUTPATIENT_CLINIC_OR_DEPARTMENT_OTHER): Payer: Self-pay

## 2015-10-24 ENCOUNTER — Emergency Department (HOSPITAL_BASED_OUTPATIENT_CLINIC_OR_DEPARTMENT_OTHER)
Admission: EM | Admit: 2015-10-24 | Discharge: 2015-10-24 | Disposition: A | Discharge status: Home / Self Care | Payer: Federal, State, Local not specified - PPO | Attending: Emergency Medicine | Admitting: Emergency Medicine

## 2015-10-24 ENCOUNTER — Emergency Department (HOSPITAL_BASED_OUTPATIENT_CLINIC_OR_DEPARTMENT_OTHER): Payer: Federal, State, Local not specified - PPO

## 2015-10-24 DIAGNOSIS — Y998 Other external cause status: Secondary | ICD-10-CM | POA: Insufficient documentation

## 2015-10-24 DIAGNOSIS — S99911A Unspecified injury of right ankle, initial encounter: Secondary | ICD-10-CM | POA: Diagnosis present

## 2015-10-24 DIAGNOSIS — Z7982 Long term (current) use of aspirin: Secondary | ICD-10-CM | POA: Diagnosis not present

## 2015-10-24 DIAGNOSIS — Z86718 Personal history of other venous thrombosis and embolism: Secondary | ICD-10-CM | POA: Diagnosis not present

## 2015-10-24 DIAGNOSIS — I493 Ventricular premature depolarization: Secondary | ICD-10-CM | POA: Diagnosis not present

## 2015-10-24 DIAGNOSIS — Z79899 Other long term (current) drug therapy: Secondary | ICD-10-CM | POA: Insufficient documentation

## 2015-10-24 DIAGNOSIS — W108XXA Fall (on) (from) other stairs and steps, initial encounter: Secondary | ICD-10-CM | POA: Diagnosis not present

## 2015-10-24 DIAGNOSIS — S82831A Other fracture of upper and lower end of right fibula, initial encounter for closed fracture: Secondary | ICD-10-CM | POA: Diagnosis not present

## 2015-10-24 DIAGNOSIS — Y92410 Unspecified street and highway as the place of occurrence of the external cause: Secondary | ICD-10-CM | POA: Diagnosis not present

## 2015-10-24 DIAGNOSIS — M1990 Unspecified osteoarthritis, unspecified site: Secondary | ICD-10-CM | POA: Insufficient documentation

## 2015-10-24 DIAGNOSIS — S82401A Unspecified fracture of shaft of right fibula, initial encounter for closed fracture: Secondary | ICD-10-CM

## 2015-10-24 DIAGNOSIS — Y9389 Activity, other specified: Secondary | ICD-10-CM | POA: Diagnosis not present

## 2015-10-24 MED ORDER — HYDROCODONE-ACETAMINOPHEN 5-325 MG PO TABS: 2.0000 | ORAL_TABLET | ORAL | Status: DC | PRN

## 2015-10-24 MED ORDER — HYDROCODONE-ACETAMINOPHEN 5-325 MG PO TABS
1.0000 | ORAL_TABLET | Freq: Once | ORAL | Status: AC
  Administered 2015-10-24: 1 via ORAL
  Filled 2015-10-24: qty 1

## 2015-10-24 NOTE — ED Provider Notes (Signed)
CSN: [id number]     Arrival date & time 10/24/15  1547 History   First MD Initiated Contact with Patient 10/24/15 1611     Chief Complaint  Patient presents with  . Fall     HPI  Impression presents for evaluation of ankle pain. See missed his step walking her friend's home and fell forward. She landed awkwardly with her ankle and foot behind her. Has pain from her right leg just below the knee to the ankle. States is painful with airway. His permanent lateral aspect of the leg she cannot specify dyspnea her knee, or foot.  Pedis right total knee. Previous right total hips. Has seen Dr. Merlyn AlbertAlucio, as well as Dr.Wiener.  Past Medical History  Diagnosis Date  . Blood transfusion   . PVC's (premature ventricular contractions)     HX OF HEART PALPITATIONS AND PVC'S  . PVC's (premature ventricular contractions)     PT STATES IF SHE NEEDS HEART DOCTOR WHILE IN HOSP-SHE WANTS DR. PANG CALLED-HER MEDICAL DOCTOR AND DR. Allyson SabalBERRY  . 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 Complication of anesthesia     woke up during colonoscopy  . H/O blood clots     RIGHT KNEE  . Heart palpitations    Past Surgical History  Procedure Laterality Date  . Tonsillectomy  1964  . Tubal ligation  1981  . Joint replacement  2007     RIGHT KNEE  . Foot surgery  2012    RIGHT -BUNIONECTOMY  . Total knee arthroplasty  09/29/2011    Procedure: TOTAL KNEE ARTHROPLASTY;  Surgeon: Loanne DrillingFrank V Aluisio;  Location: [facility];  Service: Orthopedics;  Laterality: Left;  . Total hip arthroplasty  08/11/2012    Procedure: TOTAL HIP ARTHROPLASTY;  Surgeon: Loanne DrillingFrank V Aluisio, MD;  Location: [facility];  Service: Orthopedics;  Laterality: Right;  . Hammer toe repair Right 02/17/11  . Bunionectomy Right 02/17/11    [facility]  . Bunionectomy Right 01/20/13    [facility]  . Colonoscopy    . Hysteroscopy w/d&c N/A 09/13/2013    Procedure: DILATATION AND CURETTAGE /HYSTEROSCOPY;  Surgeon: Miguel AschoffAllan  Ross, MD;  Location: [facility];  Service: Gynecology;  Laterality: N/A;   Family History  Problem Relation Age of Onset  . Colon cancer Neg Hx    Social History  Substance Use Topics  . Smoking status: Never Smoker   . Smokeless tobacco: Never Used  . Alcohol Use: No   OB History    No data available     Review of Systems  Constitutional: Negative for fever, chills, diaphoresis, appetite change and fatigue.  HENT: Negative for mouth sores, sore throat and trouble swallowing.   Eyes: Negative for visual disturbance.  Respiratory: Negative for cough, chest tightness, shortness of breath and wheezing.   Cardiovascular: Negative for chest pain.  Gastrointestinal: Negative for nausea, vomiting, abdominal pain, diarrhea and abdominal distention.  Endocrine: Negative for polydipsia, polyphagia and polyuria.  Genitourinary: Negative for dysuria, frequency and hematuria.  Musculoskeletal: Negative for gait problem.       Right lower leg pain  Skin: Negative for color change, pallor and rash.  Neurological: Negative for dizziness, syncope, light-headedness and headaches.  Hematological: Does not bruise/bleed easily.  Psychiatric/Behavioral: Negative for behavioral problems and confusion.      Allergies  Aspirin and Codeine  Home Medications   Prior to Admission medications   Medication Sig Start Date End Date Taking? Authorizing  Provider  aspirin 81 MG tablet Take 81 mg by mouth daily.    Historical Provider, MD  atorvastatin (LIPITOR) 40 MG tablet Take 40 mg by mouth every evening.     Historical Provider, MD  Cholecalciferol (VITAMIN D-3) 5000 UNITS TABS Take 1 tablet by mouth daily.     Historical Provider, MD  HYDROcodone-acetaminophen (NORCO/VICODIN) 5-325 MG tablet Take 2 tablets by mouth every 4 (four) hours as needed. 10/24/15   Rolland Porter, MD  metoprolol (TOPROL-XL) 50 MG 24 hr tablet Take 50 mg by mouth every evening.     Historical Provider, MD  Polyethyl Glycol-Propyl  Glycol (SYSTANE) 0.4-0.3 % SOLN Apply 1 drop to eye as needed. Dry eyes    Historical Provider, MD   BP 131/85 mmHg  Pulse 69  Temp(Src) 97.7 F (36.5 C) (Oral)  Resp 18  Ht  (1.727 m)  Wt 245 lb (111.131 kg)  BMI 37.26 kg/m2  SpO2 96% Physical Exam  Constitutional: She is oriented to person, place, and time. She appears well-developed and well-nourished. No distress.  HENT:  Head: Normocephalic.  Eyes: Conjunctivae are normal. Pupils are equal, round, and reactive to light. No scleral icterus.  Neck: Normal range of motion. Neck supple. No thyromegaly present.  Cardiovascular: Normal rate and regular rhythm.  Exam reveals no gallop and no friction rub.   No murmur heard. Pulmonary/Chest: Effort normal and breath sounds normal. No respiratory distress. She has no wheezes. She has no rales.  Abdominal: Soft. Bowel sounds are normal. She exhibits no distension. There is no tenderness. There is no rebound.  Musculoskeletal: Normal range of motion.       Legs: Tenderness laterally from just below the knee time, to the lateral ankle. Nontender over the hip. Full range of motion of the knee.  Neurological: She is alert and oriented to person, place, and time.  Skin: Skin is warm and dry. No rash noted.  Psychiatric: She has a normal mood and affect. Her behavior is normal.    ED Course  Procedures (including critical care time) Labs Review Labs Reviewed - No data to display  Imaging Review Dg Tibia/fibula Right  10/24/2015  CLINICAL DATA:  Larey Seat down stairs today.  Right lower extremity pain. EXAM: RIGHT TIBIA AND FIBULA - 2 VIEW; RIGHT ANKLE - COMPLETE 3+ VIEW COMPARISON:  None. FINDINGS: Right tibia/fibula: The total knee components are well seated without complicating features. No fracture of the tibial shaft is identified. There is a distal fibular fracture. Extensive superficial varicosities are noted. Right ankle: There is a nondisplaced fracture of the distal fibula above  the level of the ankle mortise. The ankle mortise is maintained. No definite tibial fracture. Could not exclude a tiny avulsion fracture off the medial malleolus. The talus appears normal. No osteochondral lesion. A small calcaneal heel spur is noted. IMPRESSION: Nondisplaced distal fibular fracture noted at the ankle. No tibial shaft fractures. Electronically Signed   By: Rudie Meyer M.D.   On: 10/24/2015 17:35   Dg Ankle Complete Right  10/24/2015  CLINICAL DATA:  Larey Seat down stairs today.  Right lower extremity pain. EXAM: RIGHT TIBIA AND FIBULA - 2 VIEW; RIGHT ANKLE - COMPLETE 3+ VIEW COMPARISON:  None. FINDINGS: Right tibia/fibula: The total knee components are well seated without complicating features. No fracture of the tibial shaft is identified. There is a distal fibular fracture. Extensive superficial varicosities are noted. Right ankle: There is a nondisplaced fracture of the distal fibula above the level of the  ankle mortise. The ankle mortise is maintained. No definite tibial fracture. Could not exclude a tiny avulsion fracture off the medial malleolus. The talus appears normal. No osteochondral lesion. A small calcaneal heel spur is noted. IMPRESSION: Nondisplaced distal fibular fracture noted at the ankle. No tibial shaft fractures. Electronically Signed   By: Rudie Meyer M.D.   On: 10/24/2015 17:35   Dg Knee Complete 4 Views Right  10/24/2015  CLINICAL DATA:  Larey Seat down stairs today, lower leg pain EXAM: RIGHT KNEE - COMPLETE 4+ VIEW COMPARISON:  None FINDINGS: Osseous demineralization. Components of RIGHT knee prosthesis in expected positions. No acute fracture, dislocation or bone destruction. Minimal knee joint effusion. Small calcified body at suprapatellar recess versus within anterior soft tissues proximal to knee. No other regional osseous findings. IMPRESSION: Osseous demineralization. RIGHT knee prosthesis. Minimal joint effusion without definite fracture or dislocation.  Electronically Signed   By: Ulyses Southward M.D.   On: 10/24/2015 17:33   I have personally reviewed and evaluated these images and lab results as part of my medical decision-making.   EKG Interpretation None      MDM   Final diagnoses:  Fibula fracture, right, closed, initial encounter    Fibula fracture noted. Remainder of the ankle intact. Placed in a cam walker. Able to walk unassisted with crutches and nonweightbearing on this extremity. Has an appointment that she has arranged with Dr.Gioffery at her orthopedics tomorrow at 3 PM.    Rolland Porter, MD 10/24/15 [phone]

## 2015-10-24 NOTE — Discharge Instructions (Signed)
Follow-up with your orthopedist that your appointment tomorrow. Crutches, splint, and nonweightbearing until you are seen tomorrow.  Fibular Ankle Fracture Treated With or Without Immobilization, Adult A fibular fracture at your ankle is a break (fracture) bone in the smallest of the two bones in your lower leg, located on the outside of your leg (fibula) close to the area at your ankle joint. CAUSES  Rolling your ankle.  Twisting your ankle.  Extreme flexing or extending of your foot.  Severe force on your ankle as when falling from a distance. RISK FACTORS  Jumping activities.  Participation in sports.  Osteoporosis.  Advanced age.  Previous ankle injuries. SIGNS AND SYMPTOMS  Pain.  Swelling.  Inability to put weight on injured ankle.  Bruising.  Bone deformities at site of injury. DIAGNOSIS  This fracture is diagnosed with the help of an X-ray exam. TREATMENT  If the fractured bone did not move out of place it usually will heal without problems and does casting or splinting. If immobilization is needed for comfort or the fractured bone moved out of place and will not heal properly with immobilization, a cast or splint will be used. HOME CARE INSTRUCTIONS   Apply ice to the area of injury:  Put ice in a plastic bag.  Place a towel between your skin and the bag.  Leave the ice on for 20 minutes, 2-3 times a day.  Use crutches as directed. Resume walking without crutches as directed by your health care provider.  Only take over-the-counter or prescription medicines for pain, discomfort, or fever as directed by your health care provider.  If you have a removable splint or boot, do not remove the boot unless directed by your health care provider. SEEK MEDICAL CARE IF:   You have continued pain or more swelling  The medications do not control the pain. SEEK IMMEDIATE MEDICAL CARE IF:  You develop severe pain in the leg or foot.  Your skin or nails below  the injury turn blue or grey or feel cold or numb. MAKE SURE YOU:   Understand these instructions.  Will watch your condition.  Will get help right away if you are not doing well or get worse.   This information is not intended to replace advice given to you by your health care provider. Make sure you discuss any questions you have with your health care provider.   Document Released: 10/20/2005 Document Revised: 11/10/2014 Document Reviewed: 06/01/2013 Elsevier Interactive Patient Education [employer]2016 [employer]

## 2015-10-24 NOTE — ED Notes (Signed)
I applied CAM walker, then fit and adjusted crutches. Patient ambulated with no assistance using crutches. Patient stated the CAM walker felt much better. i

## 2015-10-24 NOTE — ED Notes (Signed)
Missed a step and fell-pain to right LE from knee down-pt presents to triage w/c

## 2016-03-10 ENCOUNTER — Other Ambulatory Visit: Payer: Self-pay | Admitting: Dermatology

## 2016-04-23 ENCOUNTER — Other Ambulatory Visit: Payer: Self-pay

## 2016-04-23 ENCOUNTER — Other Ambulatory Visit: Payer: Self-pay | Admitting: Obstetrics and Gynecology

## 2016-04-23 DIAGNOSIS — Z1231 Encounter for screening mammogram for malignant neoplasm of breast: Secondary | ICD-10-CM

## 2016-07-22 ENCOUNTER — Other Ambulatory Visit: Payer: Self-pay

## 2016-07-22 DIAGNOSIS — N644 Mastodynia: Secondary | ICD-10-CM

## 2016-08-18 ENCOUNTER — Other Ambulatory Visit: Payer: Self-pay | Admitting: Obstetrics and Gynecology

## 2016-08-18 DIAGNOSIS — N644 Mastodynia: Secondary | ICD-10-CM

## 2016-08-22 ENCOUNTER — Ambulatory Visit
Admission: RE | Admit: 2016-08-22 | Discharge: 2016-08-22 | Disposition: A | Discharge status: Home / Self Care | Payer: Federal, State, Local not specified - PPO | Source: Ambulatory Visit | Attending: Obstetrics and Gynecology | Admitting: Obstetrics and Gynecology

## 2016-08-22 DIAGNOSIS — N644 Mastodynia: Secondary | ICD-10-CM

## 2017-08-21 ENCOUNTER — Other Ambulatory Visit: Payer: Self-pay | Admitting: Obstetrics and Gynecology

## 2017-08-21 DIAGNOSIS — Z1231 Encounter for screening mammogram for malignant neoplasm of breast: Secondary | ICD-10-CM

## 2017-09-11 ENCOUNTER — Ambulatory Visit
Admission: RE | Admit: 2017-09-11 | Discharge: 2017-09-11 | Disposition: A | Discharge status: Home / Self Care | Payer: Medicare Other | Source: Ambulatory Visit | Attending: Obstetrics and Gynecology | Admitting: Obstetrics and Gynecology

## 2017-09-11 DIAGNOSIS — Z1231 Encounter for screening mammogram for malignant neoplasm of breast: Secondary | ICD-10-CM

## 2017-12-31 ENCOUNTER — Other Ambulatory Visit: Payer: Self-pay

## 2017-12-31 ENCOUNTER — Emergency Department (HOSPITAL_BASED_OUTPATIENT_CLINIC_OR_DEPARTMENT_OTHER)
Admission: EM | Admit: 2017-12-31 | Discharge: 2017-12-31 | Disposition: A | Discharge status: Home / Self Care | Payer: Federal, State, Local not specified - PPO | Attending: Emergency Medicine | Admitting: Emergency Medicine

## 2017-12-31 ENCOUNTER — Encounter (HOSPITAL_BASED_OUTPATIENT_CLINIC_OR_DEPARTMENT_OTHER): Payer: Self-pay | Admitting: *Deleted

## 2017-12-31 DIAGNOSIS — Y939 Activity, unspecified: Secondary | ICD-10-CM | POA: Insufficient documentation

## 2017-12-31 DIAGNOSIS — Y999 Unspecified external cause status: Secondary | ICD-10-CM | POA: Insufficient documentation

## 2017-12-31 DIAGNOSIS — Z96653 Presence of artificial knee joint, bilateral: Secondary | ICD-10-CM | POA: Diagnosis not present

## 2017-12-31 DIAGNOSIS — Z7982 Long term (current) use of aspirin: Secondary | ICD-10-CM | POA: Insufficient documentation

## 2017-12-31 DIAGNOSIS — Z79899 Other long term (current) drug therapy: Secondary | ICD-10-CM | POA: Diagnosis not present

## 2017-12-31 DIAGNOSIS — Y92002 Bathroom of unspecified non-institutional (private) residence single-family (private) house as the place of occurrence of the external cause: Secondary | ICD-10-CM | POA: Diagnosis not present

## 2017-12-31 DIAGNOSIS — I83891 Varicose veins of right lower extremities with other complications: Secondary | ICD-10-CM | POA: Insufficient documentation

## 2017-12-31 DIAGNOSIS — Z96641 Presence of right artificial hip joint: Secondary | ICD-10-CM | POA: Insufficient documentation

## 2017-12-31 DIAGNOSIS — X58XXXA Exposure to other specified factors, initial encounter: Secondary | ICD-10-CM | POA: Insufficient documentation

## 2017-12-31 DIAGNOSIS — R58 Hemorrhage, not elsewhere classified: Secondary | ICD-10-CM | POA: Diagnosis present

## 2017-12-31 LAB — CBC
HEMATOCRIT: 39.4 % (ref 36.0–46.0)
HEMOGLOBIN: 13.1 g/dL (ref 12.0–15.0)
MCH: 28.6 pg (ref 26.0–34.0)
MCHC: 33.2 g/dL (ref 30.0–36.0)
MCV: 86 fL (ref 78.0–100.0)
Platelets: 222 10*3/uL (ref 150–400)
RBC: 4.58 MIL/uL (ref 3.87–5.11)
RDW: 14.8 % (ref 11.5–15.5)
WBC: 4.4 10*3/uL (ref 4.0–10.5)

## 2017-12-31 MED ORDER — SILVER NITRATE-POT NITRATE 75-25 % EX MISC
3.0000 | Freq: Once | CUTANEOUS | Status: AC
  Administered 2017-12-31: 3 via TOPICAL
  Filled 2017-12-31: qty 3

## 2017-12-31 MED ORDER — LIDOCAINE-EPINEPHRINE (PF) 2 %-1:200000 IJ SOLN
20.0000 mL | Freq: Once | INTRAMUSCULAR | Status: AC
  Administered 2017-12-31: 20 mL
  Filled 2017-12-31: qty 20

## 2017-12-31 MED ORDER — LIDOCAINE-EPINEPHRINE (PF) 2 %-1:200000 IJ SOLN
10.0000 mL | Freq: Once | INTRAMUSCULAR | Status: DC
  Filled 2017-12-31: qty 10

## 2017-12-31 NOTE — ED Provider Notes (Signed)
[facility] EMERGENCY DEPARTMENT Provider Note   CSN: [id number] Arrival date & time: 12/31/17  1027     History   Chief Complaint Chief Complaint  Patient presents with  . leg bleeding    HPI Christina Duarte is a 72 y.o. female with a history of osteoarthritis who presents to the emergency department with a chief complaint of active bleeding from the right ankle.  The patient states that she was taking a shower this morning when she noticed that there was bleeding to the right foot.  Bleeding has been persistent since onset.  The patient states the blood was squirting across the room until she wrapped the area with 4 baby diapers and 2 maxipads, which were all saturated with bright red blood when her daughter arrived.  No other treatment prior to arrival.  No history of similar.  She denies right ankle or foot pain, numbness, weakness, dizziness, lightheadedness, shortness of breath, or fatigue.  She takes a baby aspirin daily, but no other anticoagulation.  The history is provided by the patient. No language interpreter was used.    Past Medical History:  Diagnosis Date  . 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 Blood transfusion   . Complication of anesthesia    woke up during colonoscopy  . H/O blood clots    RIGHT KNEE  . Heart palpitations   . PVC's (premature ventricular contractions)    HX OF HEART PALPITATIONS AND PVC'S  . PVC's (premature ventricular contractions)    PT STATES IF SHE NEEDS HEART DOCTOR WHILE IN HOSP-SHE WANTS DR. PANG CALLED-HER MEDICAL DOCTOR AND DR. Allyson Sabal    Patient Active Problem List   Diagnosis Date Noted  . Status post foot surgery 01/31/2013  . Postop Hypokalemia 08/12/2012  . OA (osteoarthritis) of hip 08/11/2012  . Hyponatremia 09/30/2011  . Osteoarthritis of left knee 09/30/2011    Past Surgical History:  Procedure Laterality Date  . BUNIONECTOMY Right 02/17/11   [facility]ride    . BUNIONECTOMY Right 01/20/13   McBride  . COLONOSCOPY    . FOOT SURGERY  2012   RIGHT -BUNIONECTOMY  . Hammer toe repair Right 02/17/11  . HYSTEROSCOPY W/D&C N/A 09/13/2013   Procedure: DILATATION AND CURETTAGE /HYSTEROSCOPY;  Surgeon: Miguel Aschoff, MD;  Location: [facility];  Service: Gynecology;  Laterality: N/A;  . JOINT REPLACEMENT  2007    RIGHT KNEE  . TONSILLECTOMY  1964  . TOTAL HIP ARTHROPLASTY  08/11/2012   Procedure: TOTAL HIP ARTHROPLASTY;  Surgeon: Loanne Drilling, MD;  Location: [facility];  Service: Orthopedics;  Laterality: Right;  . TOTAL KNEE ARTHROPLASTY  09/29/2011   Procedure: TOTAL KNEE ARTHROPLASTY;  Surgeon: Loanne Drilling;  Location: [facility];  Service: Orthopedics;  Laterality: Left;  . TUBAL LIGATION  1981    OB History    No data available       Home Medications    Prior to Admission medications   Medication Sig Start Date End Date Taking? Authorizing Provider  aspirin 81 MG tablet Take 81 mg by mouth daily.    [provider]  atorvastatin (LIPITOR) 40 MG tablet Take 40 mg by mouth every evening.     [provider]  Cholecalciferol (VITAMIN D-3) 5000 UNITS TABS Take 1 tablet by mouth daily.     [provider]  HYDROcodone-acetaminophen (NORCO/VICODIN) 5-325 MG tablet Take 2 tablets by mouth every 4 (four) hours as needed.  10/24/15   Rolland PorterJames, Mark, MD  metoprolol (TOPROL-XL) 50 MG 24 hr tablet Take 50 mg by mouth every evening.     [provider]  Polyethyl Glycol-Propyl Glycol (SYSTANE) 0.4-0.3 % SOLN Apply 1 drop to eye as needed. Dry eyes    [provider]    Family History Family History  Problem Relation Age of Onset  . Colon cancer Neg Hx     Social History Social History   Tobacco Use  . Smoking status: Never Smoker  . Smokeless tobacco: Never Used  Substance Use Topics  . Alcohol use: No  . Drug use: No     Allergies   Aspirin and Codeine   Review of Systems Review of Systems   Constitutional: Negative for activity change and fatigue.  Respiratory: Negative for shortness of breath.   Cardiovascular: Negative for chest pain.  Gastrointestinal: Negative for abdominal pain, diarrhea, nausea and vomiting.  Musculoskeletal: Negative for back pain.  Skin: Positive for wound. Negative for rash.  Neurological: Negative for dizziness, weakness, light-headedness and numbness.     Physical Exam Updated Vital Signs BP 123/64 (BP Location: Right Arm)   Pulse (!) 55   Temp 98.2 F (36.8 C) (Oral)   Resp 16   Ht 5\' 8"  (1.727 m)   Wt 91.2 kg (201 lb)   SpO2 99%   BMI 30.56 kg/m   Physical Exam  Constitutional: No distress.  HENT:  Head: Normocephalic.  Eyes: Conjunctivae are normal.  Neck: Neck supple.  Cardiovascular: Normal rate and regular rhythm. Exam reveals no gallop and no friction rub.  No murmur heard. Pulmonary/Chest: Effort normal. No stridor. No respiratory distress. She has no wheezes. She has no rales. She exhibits no tenderness.  Abdominal: Soft. She exhibits no distension.  Musculoskeletal:  Multiple varicose veins are noted to the bilateral lower extremities.  Active dark red bleeding from the medial aspects of the right ankle from a varicose vein.  Good capillary refill to all digits of the right foot.  5 out of 5 strength against resistance against dorsiflexion and plantarflexion.  Sensation is intact throughout.  Right foot and ankle are warm to the touch.  DP and PT pulses are 2+ and symmetric.  Neurological: She is alert.  Skin: Skin is warm. Capillary refill takes less than 2 seconds. No rash noted.  Psychiatric: Her behavior is normal.  Nursing note and vitals reviewed.    ED Treatments / Results  Labs (all labs ordered are listed, but only abnormal results are displayed) Labs Reviewed  CBC    EKG  EKG Interpretation None       Radiology No results found.  Procedures .Marland Kitchen.Laceration Repair Date/Time: 12/31/2017 5:52  PM Performed by: Barkley BoardsMcDonald, Kaleah Hagemeister A, PA-C Authorized by: Barkley BoardsMcDonald, Namir Neto A, PA-C   Consent:    Consent obtained:  Verbal   Consent given by:  Patient   Risks discussed:  Infection, pain, need for additional repair, nerve damage, poor wound healing and vascular damage   Alternatives discussed:  No treatment Anesthesia (see MAR for exact dosages):    Anesthesia method:  Local infiltration   Local anesthetic:  Lidocaine 2% WITH epi Laceration details:    Location: right ankle    Length (cm):  0.2 Repair type:    Repair type:  Simple Exploration:    Hemostasis obtained with: Epinephrine, sutures, chest pressure, and elevation.   Wound exploration: wound explored through full range of motion and entire depth of wound probed and visualized  Wound extent: vascular damage     Contaminated: no   Treatment:    Area cleansed with:  Saline   Amount of cleaning:  Standard   Irrigation solution:  Sterile saline Skin repair:    Repair method:  Sutures   Suture size:  3-0   Suture material:  Prolene   Suture technique: 1 simple interrupted and 1 horizontal mattress.   Number of sutures:  2 Approximation:    Approximation:  Close Post-procedure details:    Dressing:  Adhesive bandage   Patient tolerance of procedure:  Tolerated well, no immediate complications   (including critical care time)  Medications Ordered in ED Medications  silver nitrate applicators applicator 3 Stick (3 Sticks Topical Given 12/31/17 1306)  lidocaine-EPINEPHrine (XYLOCAINE W/EPI) 2 %-1:200000 (PF) injection 20 mL (20 mLs Infiltration Given by Other 12/31/17 1320)     Initial Impression / Assessment and Plan / ED Course  I have reviewed the triage vital signs and the nursing notes.  Pertinent labs & imaging results that were available during my care of the patient were reviewed by me and considered in my medical decision making (see chart for details).     72 year old female with a history of osteoarthritis  presenting with active bleeding from a varicose vein located at the right medial ankle.  The patient was discussed and evaluated with Dr. Rennis Chris, attending physician.  She has no other symptoms of anemia including fatigue, shortness of breath, dizziness, or lightheadedness.  Hemoglobin 13.1.  Initial blood pressure 93/61; however manual blood pressure with 110/64.  HR 99. The area was anesthetized with lidocaine with epinephrine and bleeding improved.  A horizontal mattress and simple interrupted sutures were placed at the site.  A dressing with Coban and was then applied over the area and the leg was elevated for 20-30 minutes.  The dressing was then removed and the patient was successfully ambulated through the department with no continued bleeding.  Recommended applying gauze and using paper tape at home to avoid injuring other varicose veins in the area.  The patient was provided with a referral to vascular surgery.  We also discussed other methods to get bleeding to stop at home if she had another similar situation.  Sutures need to be removed in 10 days.  Discussed home suture care.  She was given strict return precautions.  She is currently hemodynamically stable and in no acute distress.  The patient is safe for discharge home with outpatient follow-up at this time.  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

## 2017-12-31 NOTE — ED Notes (Signed)
Pt was ambulated and there was no bleeding noted on suture area, RN Adrienne informed.

## 2017-12-31 NOTE — Discharge Instructions (Signed)
The stitches in your right ankle need to be removed in 10 days.  You can follow-up with primary care, urgent care, or return to the emergency department for suture removal.  Keep the area around the stitches clean with warm soap and water.  If you develop redness, warmth, or swelling around the area or fever and chills, please return to the emergency department for evaluation.  Compression socks are available over-the-counter and can help to reduce the risk of bleeding and other varicose veins.  I have attached information on how to use compression stockings.  If your leg starts bleeding again in the future, elevate the leg above the level of the heart and apply direct pressure to any spots that are bleeding.  Call to schedule follow-up appointment with vascular surgery.  If you develop new or worsening symptoms, including another area that does not stop bleeding at home, dizziness, lightheaded, or other concerning symptoms, please return to the emergency department for reevaluation.

## 2017-12-31 NOTE — ED Provider Notes (Signed)
Complains of bleeding from varicose vein at right medial ankle onset this morning.  She denies any pain denies lightheadedness.  On exam patient in no distress left lower extremity there is a pinpoint lesion which is oozing dark blood at medial aspect of the ankle at site of varicose vein.  DP pulse is 2+.  Toes are warm.  Good capillary refill   Doug SouJacubowitz, Denica Web, MD 12/31/17 1318

## 2017-12-31 NOTE — ED Triage Notes (Signed)
Pt reports taking a bath this morning and noticed that her right foot was bleeding, 4 baby diapers and 2 maxi pads wrapped around her foot by her daughter who is a nurse all are saturated with brb. Pt is smiling and laughing, denies any injury or trauma, denies any pain.

## 2018-01-01 ENCOUNTER — Other Ambulatory Visit: Payer: Self-pay

## 2018-01-04 ENCOUNTER — Encounter: Payer: Medicare Other | Admitting: Vascular Surgery

## 2018-01-04 ENCOUNTER — Other Ambulatory Visit: Payer: Self-pay

## 2018-01-04 ENCOUNTER — Ambulatory Visit (INDEPENDENT_AMBULATORY_CARE_PROVIDER_SITE_OTHER): Payer: Federal, State, Local not specified - PPO | Admitting: Vascular Surgery

## 2018-01-04 ENCOUNTER — Encounter: Payer: Self-pay | Admitting: Vascular Surgery

## 2018-01-04 VITALS — BP 137/76 | HR 59 | Temp 97.9°F | Resp 16 | Ht 68.0 in | Wt 202.0 lb

## 2018-01-04 DIAGNOSIS — I83893 Varicose veins of bilateral lower extremities with other complications: Secondary | ICD-10-CM | POA: Diagnosis not present

## 2018-01-04 NOTE — Progress Notes (Signed)
Subjective:     Patient ID: Christina Duarte, female   DOB: 12-19-1945, 72 y.o.   MRN: [id number]  HPI This 72 year old female was referred by Emergency department for recent episode of bleeding from varicose veins the right lower extremity. On February 28-2019 patient developed spontaneous bleeding from a varix in her right medial ankle area. This required a trip to the emergency department and suturing at that location. She's had no recurrent bleeding. She has had no history of bleeding prior to that time. She also has no history of stasis ulcers but does develop heavy aching throbbing discomfort and swelling in both legs is a day progresses right worse than left. She has no history of previous treatment. She does were short leg elastic compression stockings.  Past Medical History:  Diagnosis Date  . 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 Blood transfusion   . Complication of anesthesia    woke up during colonoscopy  . H/O blood clots    RIGHT KNEE  . Heart palpitations   . PVC's (premature ventricular contractions)    HX OF HEART PALPITATIONS AND PVC'S  . PVC's (premature ventricular contractions)    PT STATES IF SHE NEEDS HEART DOCTOR WHILE IN HOSP-SHE WANTS DR. PANG CALLED-HER MEDICAL DOCTOR AND DR. Allyson Sabal    Social History   Tobacco Use  . Smoking status: Never Smoker  . Smokeless tobacco: Never Used  Substance Use Topics  . Alcohol use: No    Family History  Problem Relation Age of Onset  . Heart disease Father   . Colon cancer Neg Hx     Allergies  Allergen Reactions  . Aspirin Other (See Comments)    Pt cant take when given with codeine. Makes her feel loopy Can take aspirin alone w/o any problem. Pt cant take when given with codeine. Makes her feel loopy Can take aspirin alone w/o any problem. Pt cant take when given with codeine. Makes her feel loopy Can take aspirin alone w/o any problem.  . Codeine Other (See  Comments)    Pt cant take when given with aspirin Pt cant take when given with aspirin Pt cant take when given with aspirin     Current Outpatient Medications:  .  aspirin 81 MG tablet, Take 81 mg by mouth daily., Disp: , Rfl:  .  atorvastatin (LIPITOR) 40 MG tablet, Take 40 mg by mouth every evening. , Disp: , Rfl:  .  atorvastatin (LIPITOR) 40 MG tablet, atorvastatin 40 mg tablet, Disp: , Rfl:  .  Cholecalciferol (VITAMIN D-3) 5000 UNITS TABS, Take 1 tablet by mouth daily. , Disp: , Rfl:  .  cyanocobalamin ([employer] VITAMIN B12) 1000 MCG tablet, cyanocobalamin (vitamin B-12), Disp: , Rfl:  .  diclofenac (CATAFLAM) 50 MG tablet, Cataflam 50 mg tablet  Take 1 tablet twice a day by oral route as needed for pain., Disp: , Rfl:  .  metoprolol (TOPROL-XL) 50 MG 24 hr tablet, Take 50 mg by mouth every evening. , Disp: , Rfl:  .  metoprolol succinate (TOPROL-XL) 50 MG 24 hr tablet, metoprolol succinate ER 50 mg tablet,extended release 24 hr, Disp: , Rfl:  .  Multiple Vitamins-Minerals (MULTIVITAMIN ADULT PO), multivitamin, Disp: , Rfl:  .  Polyethyl Glycol-Propyl Glycol (SYSTANE) 0.4-0.3 % SOLN, Apply 1 drop to eye as needed. Dry eyes, Disp: , Rfl:  .  vitamin B-12 (CYANOCOBALAMIN) 1000 MCG tablet, Take by mouth., Disp: , Rfl:  .  Acetaminophen (TYLENOL) 325 MG CAPS, Tylenol, Disp: , Rfl:  .  HYDROcodone-acetaminophen (NORCO/VICODIN) 5-325 MG tablet, Take 2 tablets by mouth every 4 (four) hours as needed. (Patient not taking: Reported on 01/04/2018), Disp: 10 tablet, Rfl: 0  Vitals:   01/04/18 1417  BP: 137/76  Pulse: (!) 59  Resp: 16  Temp: 97.9 F (36.6 C)  TempSrc: Oral  SpO2: 100%  Weight: 202 lb (91.6 kg)  Height: 5\' 8"  (1.727 m)    Body mass index is 30.71 kg/m.         Review of Systems Denies chest pain, dyspnea on exertion, PND, orthopnea, hemoptysis, claudication. Has osteoarthritis with previous right knee replacement-needs left knee replacement    Objective:   Physical  Exam BP 137/76 (BP Location: Left Arm, Patient Position: Sitting, Cuff Size: Normal)   Pulse (!) 59   Temp 97.9 F (36.6 C) (Oral)   Resp 16   Ht 5\' 8"  (1.727 m)   Wt 202 lb (91.6 kg)   SpO2 100%   BMI 30.71 kg/m     Gen.-alert and oriented x3 in no apparent distress HEENT normal for age Lungs no rhonchi or wheezing Cardiovascular regular rhythm no murmurs carotid pulses 3+ palpable no bruits audible Abdomen soft nontender no palpable masses Musculoskeletal free of  major deformities Skin clear -no rashes Neurologic normal Lower extremities 3+ femoral and dorsalis pedis pulses palpable bilaterally with 1+ edema bilaterally right greater than left Large bulging varicosities in the medial thigh and medial calf on the right extending down the medial malleolus. Prominent reticular and small varicosities posterior to right medial malleolus were bleeding occurred. Suture in place. No hyperpigmentation or ulceration noted. Left leg has bulging varicosities in the medial thigh and calf not quite as large with 1+ edema distally. No ulceration noted.  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 with episode of spontaneous bleeding from varix right ankle 5 days ago with no recurrent bleeding-CEA P3    Plan:         #1 long leg elastic compression stockings 20-30 mm gradient #2 elevate legs as much as possible #3 ibuprofen daily on a regular basis for pain #4 return in 3 months-she will have formal venous reflux exam upon return and I will make formal recommendation which will likely include #1 laser ablation right great saphenous vein followed by #2 laser ablation left great saphenous vein plus foam sclerotherapy  of bleeding site on the right She will then return in 3 months for possible stab phlebectomy of residual varicosities Return in 3 months with formal recommendation at that time If patient has second episode of spontaneous bleeding she will be in touch with us to waive the three-month waiting.

## 2018-01-05 NOTE — Addendum Note (Signed)
Addended by: Burton ApleyPETTY, Mylia Pondexter A on: 01/05/2018 03:57 PM   Modules accepted: Orders

## 2018-01-31 ENCOUNTER — Other Ambulatory Visit: Payer: Self-pay

## 2018-01-31 ENCOUNTER — Encounter (HOSPITAL_BASED_OUTPATIENT_CLINIC_OR_DEPARTMENT_OTHER): Payer: Self-pay | Admitting: Emergency Medicine

## 2018-01-31 ENCOUNTER — Emergency Department (HOSPITAL_BASED_OUTPATIENT_CLINIC_OR_DEPARTMENT_OTHER)
Admission: EM | Admit: 2018-01-31 | Discharge: 2018-01-31 | Disposition: A | Discharge status: Home / Self Care | Payer: Medicare Other | Attending: Emergency Medicine | Admitting: Emergency Medicine

## 2018-01-31 DIAGNOSIS — R197 Diarrhea, unspecified: Secondary | ICD-10-CM | POA: Insufficient documentation

## 2018-01-31 DIAGNOSIS — Z96641 Presence of right artificial hip joint: Secondary | ICD-10-CM | POA: Diagnosis not present

## 2018-01-31 DIAGNOSIS — R112 Nausea with vomiting, unspecified: Secondary | ICD-10-CM

## 2018-01-31 DIAGNOSIS — Z96651 Presence of right artificial knee joint: Secondary | ICD-10-CM | POA: Insufficient documentation

## 2018-01-31 DIAGNOSIS — Z96652 Presence of left artificial knee joint: Secondary | ICD-10-CM | POA: Diagnosis not present

## 2018-01-31 DIAGNOSIS — R109 Unspecified abdominal pain: Secondary | ICD-10-CM | POA: Insufficient documentation

## 2018-01-31 DIAGNOSIS — Z79899 Other long term (current) drug therapy: Secondary | ICD-10-CM | POA: Diagnosis not present

## 2018-01-31 LAB — COMPREHENSIVE METABOLIC PANEL
ALK PHOS: 85 U/L (ref 38–126)
ALT: 44 U/L (ref 14–54)
AST: 45 U/L — AB (ref 15–41)
Albumin: 4.7 g/dL (ref 3.5–5.0)
Anion gap: 12 (ref 5–15)
BUN: 28 mg/dL — ABNORMAL HIGH (ref 6–20)
CALCIUM: 9.7 mg/dL (ref 8.9–10.3)
CHLORIDE: 104 mmol/L (ref 101–111)
CO2: 23 mmol/L (ref 22–32)
CREATININE: 0.79 mg/dL (ref 0.44–1.00)
GFR calc Af Amer: >60 mL/min (ref >60)
Glucose, Bld: 126 mg/dL — ABNORMAL HIGH (ref 65–99)
Potassium: 3.9 mmol/L (ref 3.5–5.1)
Sodium: 139 mmol/L (ref 135–145)
Total Bilirubin: 1.6 mg/dL — ABNORMAL HIGH (ref 0.3–1.2)
Total Protein: 8.3 g/dL — ABNORMAL HIGH (ref 6.5–8.1)

## 2018-01-31 LAB — URINALYSIS, MICROSCOPIC (REFLEX)

## 2018-01-31 LAB — CBC WITH DIFFERENTIAL/PLATELET
BASOS ABS: 0 10*3/uL (ref 0.0–0.1)
Basophils Relative: 0 %
EOS PCT: 0 %
Eosinophils Absolute: 0 10*3/uL (ref 0.0–0.7)
HEMATOCRIT: 46.5 % — AB (ref 36.0–46.0)
HEMOGLOBIN: 15.6 g/dL — AB (ref 12.0–15.0)
LYMPHS PCT: 6 %
Lymphs Abs: 0.4 10*3/uL — ABNORMAL LOW (ref 0.7–4.0)
MCH: 28.4 pg (ref 26.0–34.0)
MCHC: 33.5 g/dL (ref 30.0–36.0)
MCV: 84.5 fL (ref 78.0–100.0)
Monocytes Absolute: 0.4 10*3/uL (ref 0.1–1.0)
Monocytes Relative: 6 %
NEUTROS ABS: 6 10*3/uL (ref 1.7–7.7)
NEUTROS PCT: 88 %
PLATELETS: 274 10*3/uL (ref 150–400)
RBC: 5.5 MIL/uL — AB (ref 3.87–5.11)
RDW: 15.4 % (ref 11.5–15.5)
WBC: 6.8 10*3/uL (ref 4.0–10.5)

## 2018-01-31 LAB — URINALYSIS, ROUTINE W REFLEX MICROSCOPIC
Glucose, UA: NEGATIVE mg/dL
Hgb urine dipstick: NEGATIVE
Ketones, ur: 15 mg/dL — AB
LEUKOCYTES UA: NEGATIVE
NITRITE: NEGATIVE
PROTEIN: 30 mg/dL — AB
pH: 6 (ref 5.0–8.0)

## 2018-01-31 LAB — LIPASE, BLOOD: Lipase: 23 U/L (ref 11–51)

## 2018-01-31 MED ORDER — SODIUM CHLORIDE 0.9 % IV BOLUS
1000.0000 mL | Freq: Once | INTRAVENOUS | Status: AC
  Administered 2018-01-31: 1000 mL via INTRAVENOUS

## 2018-01-31 MED ORDER — ONDANSETRON HCL 4 MG PO TABS: 4.0000 mg | ORAL_TABLET | Freq: Three times a day (TID) | ORAL | 0 refills | Status: DC | PRN

## 2018-01-31 MED ORDER — ONDANSETRON HCL 4 MG/2ML IJ SOLN
4.0000 mg | Freq: Once | INTRAMUSCULAR | Status: AC
  Administered 2018-01-31: 4 mg via INTRAVENOUS
  Filled 2018-01-31: qty 2

## 2018-01-31 NOTE — ED Notes (Signed)
Denies n/v or abd at present. Sister at bedside

## 2018-01-31 NOTE — ED Notes (Signed)
ED Provider at bedside. 

## 2018-01-31 NOTE — ED Notes (Signed)
Pt unable to void at this time. 

## 2018-01-31 NOTE — ED Provider Notes (Signed)
[facility] EMERGENCY DEPARTMENT Provider Note   CSN: [id number] Arrival date & time: 01/31/18  1114     History   Chief Complaint No chief complaint on file.   HPI Christina Duarte is a 72 y.o. female.  The history is provided by the patient, a relative and medical records.  Diarrhea   This is a new problem. The current episode started 2 days ago. The problem occurs 5 to 10 times per day. The problem has not changed since onset.The stool consistency is described as watery. There has been no fever. Associated symptoms include abdominal pain and vomiting. Pertinent negatives include no chills, no headaches, no URI and no cough. She has tried nothing for the symptoms. The treatment provided no relief.    Past Medical History:  Diagnosis Date  . 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 Blood transfusion   . Complication of anesthesia    woke up during colonoscopy  . H/O blood clots    RIGHT KNEE  . Heart palpitations   . PVC's (premature ventricular contractions)    HX OF HEART PALPITATIONS AND PVC'S  . PVC's (premature ventricular contractions)    PT STATES IF SHE NEEDS HEART DOCTOR WHILE IN HOSP-SHE WANTS DR. PANG CALLED-HER MEDICAL DOCTOR AND DR. Allyson Sabal    Patient Active Problem List   Diagnosis Date Noted  . Varicose veins of bilateral lower extremities with other complications 01/04/2018  . Status post foot surgery 01/31/2013  . Postop Hypokalemia 08/12/2012  . OA (osteoarthritis) of hip 08/11/2012  . Hyponatremia 09/30/2011  . Osteoarthritis of left knee 09/30/2011    Past Surgical History:  Procedure Laterality Date  . BUNIONECTOMY Right 02/17/11   [facility]ride  . BUNIONECTOMY Right 01/20/13   McBride  . COLONOSCOPY    . FOOT SURGERY  2012   RIGHT -BUNIONECTOMY  . Hammer toe repair Right 02/17/11  . HYSTEROSCOPY W/D&C N/A 09/13/2013   Procedure: DILATATION AND CURETTAGE /HYSTEROSCOPY;  Surgeon: Miguel Aschoff,  MD;  Location: [facility];  Service: Gynecology;  Laterality: N/A;  . JOINT REPLACEMENT  2007    RIGHT KNEE  . TONSILLECTOMY  1964  . TOTAL HIP ARTHROPLASTY  08/11/2012   Procedure: TOTAL HIP ARTHROPLASTY;  Surgeon: Loanne Drilling, MD;  Location: [facility];  Service: Orthopedics;  Laterality: Right;  . TOTAL KNEE ARTHROPLASTY  09/29/2011   Procedure: TOTAL KNEE ARTHROPLASTY;  Surgeon: Loanne Drilling;  Location: [facility];  Service: Orthopedics;  Laterality: Left;  . TUBAL LIGATION  1981     OB History   None      Home Medications    Prior to Admission medications   Medication Sig Start Date End Date Taking? Authorizing Provider  Acetaminophen (TYLENOL) 325 MG CAPS Tylenol    [provider]  aspirin 81 MG tablet Take 81 mg by mouth daily.    [provider]  atorvastatin (LIPITOR) 40 MG tablet Take 40 mg by mouth every evening.     [provider]  atorvastatin (LIPITOR) 40 MG tablet atorvastatin 40 mg tablet    [provider]  Cholecalciferol (VITAMIN D-3) 5000 UNITS TABS Take 1 tablet by mouth daily.     [provider]  cyanocobalamin ([employer] VITAMIN B12) 1000 MCG tablet cyanocobalamin (vitamin B-12)    [provider]  diclofenac (CATAFLAM) 50 MG tablet Cataflam 50 mg tablet  Take 1 tablet twice a day by oral route as needed  for pain.    [provider]  HYDROcodone-acetaminophen (NORCO/VICODIN) 5-325 MG tablet Take 2 tablets by mouth every 4 (four) hours as needed. Patient not taking: Reported on 01/04/2018 10/24/15   Rolland PorterJames, Mark, MD  metoprolol (TOPROL-XL) 50 MG 24 hr tablet Take 50 mg by mouth every evening.     [provider]  metoprolol succinate (TOPROL-XL) 50 MG 24 hr tablet metoprolol succinate ER 50 mg tablet,extended release 24 hr    [provider]  Multiple Vitamins-Minerals (MULTIVITAMIN ADULT PO) multivitamin    [provider]  Polyethyl Glycol-Propyl Glycol (SYSTANE) 0.4-0.3 % SOLN  Apply 1 drop to eye as needed. Dry eyes    [provider]  vitamin B-12 (CYANOCOBALAMIN) 1000 MCG tablet Take by mouth.    [provider]    Family History Family History  Problem Relation Age of Onset  . Heart disease Father   . Colon cancer Neg Hx     Social History Social History   Tobacco Use  . Smoking status: Never Smoker  . Smokeless tobacco: Never Used  Substance Use Topics  . Alcohol use: No  . Drug use: No     Allergies   Aspirin and Codeine   Review of Systems Review of Systems  Constitutional: Negative for chills, diaphoresis, fatigue and fever.  HENT: Negative for congestion.   Eyes: Negative for visual disturbance.  Respiratory: Negative for cough, choking, chest tightness, shortness of breath and wheezing.   Cardiovascular: Negative for chest pain, palpitations and leg swelling.  Gastrointestinal: Positive for abdominal pain, diarrhea and vomiting.  Genitourinary: Negative for decreased urine volume, difficulty urinating, dysuria and flank pain.  Musculoskeletal: Negative for back pain, neck pain and neck stiffness.  Skin: Negative for wound.  Neurological: Negative for seizures, speech difficulty, light-headedness and headaches.  Psychiatric/Behavioral: Negative for agitation.  All other systems reviewed and are negative.    Physical Exam Updated Vital Signs BP 106/86 (BP Location: Right Arm)   Pulse 97   Temp 99.5 F (37.5 C) (Oral)   Resp 18   Ht 5\' 8"  (1.727 m)   Wt 88 kg (194 lb)   SpO2 100%   BMI 29.50 kg/m   Physical Exam  Constitutional: She appears well-developed and well-nourished. No distress.  HENT:  Head: Normocephalic and atraumatic.  Mouth/Throat: Oropharynx is clear and moist. No oropharyngeal exudate.  Eyes: Pupils are equal, round, and reactive to light. Conjunctivae and EOM are normal.  Neck: Normal range of motion. Neck supple.  Cardiovascular: Normal rate and regular rhythm.  No murmur  heard. Pulmonary/Chest: Effort normal and breath sounds normal. No respiratory distress. She has no wheezes. She has no rales. She exhibits no tenderness.  Abdominal: Soft. There is no tenderness.  Musculoskeletal: She exhibits no edema.  Neurological: She is alert.  Skin: Skin is warm and dry. Capillary refill takes less than 2 seconds. No rash noted. She is not diaphoretic. No erythema.  Psychiatric: She has a normal mood and affect.  Nursing note and vitals reviewed.    ED Treatments / Results  Labs (all labs ordered are listed, but only abnormal results are displayed) Labs Reviewed  URINALYSIS, ROUTINE W REFLEX MICROSCOPIC - Abnormal; Notable for the following components:      Result Value   APPearance CLOUDY (*)    Specific Gravity, Urine >1.030 (*)    Bilirubin Urine SMALL (*)    Ketones, ur 15 (*)    Protein, ur 30 (*)    All other  components within normal limits  CBC WITH DIFFERENTIAL/PLATELET - Abnormal; Notable for the following components:   RBC 5.50 (*)    Hemoglobin 15.6 (*)    HCT 46.5 (*)    Lymphs Abs 0.4 (*)    All other components within normal limits  COMPREHENSIVE METABOLIC PANEL - Abnormal; Notable for the following components:   Glucose, Bld 126 (*)    BUN 28 (*)    Total Protein 8.3 (*)    AST 45 (*)    Total Bilirubin 1.6 (*)    All other components within normal limits  URINALYSIS, MICROSCOPIC (REFLEX) - Abnormal; Notable for the following components:   Bacteria, UA MANY (*)    Squamous Epithelial / LPF 6-30 (*)    All other components within normal limits  URINE CULTURE  LIPASE, BLOOD    EKG None  Radiology No results found.  Procedures Procedures (including critical care time)  Medications Ordered in ED Medications  sodium chloride 0.9 % bolus 1,000 mL (0 mLs Intravenous Stopped 01/31/18 1430)  ondansetron (ZOFRAN) injection 4 mg (4 mg Intravenous Given 01/31/18 1303)     Initial Impression / Assessment and Plan / ED Course  I have  reviewed the triage vital signs and the nursing notes.  Pertinent labs & imaging results that were available during my care of the patient were reviewed by me and considered in my medical decision making (see chart for details).     JAQUAYA COYLE is a 72 y.o. female with a past medical history significant for osteoarthritis who presents with nausea, vomiting, diarrhea, and abdominal cramping.  She reports that for the last 2 days she has had the symptoms and has had numerous episodes of loose nonbloody stools.  She reports cirrhosis with nausea and vomiting is not been able to tolerate eating or drinking.  She reports minimal oral intake.  She reports some abdominal cramping when she is having the emesis but has no abdominal pain on arrival to the ED or during her stay.  She denies any fevers, chills, URI symptoms or urinary symptoms.  She otherwise appears well.  On exam, abdomen is nontender.  Lungs are clear.  Chest is nontender.  Legs have no edema and patient has no focal neurologic deficits.  Patient appears well.  Next  Given patient's intolerance of p.o. for the last few days, patient will be given fluids and nausea medicine.  Patient will have laboratory testing to look for significant electrode abnormalities or organ dysfunction.  Urinalysis did not show UTI and labs were overall reassuring.  Patient was able to tolerate eating and drinking after nausea medicine on board.  Patient felt better after fluids.  Given improvement in symptoms I suspect dehydration is improving.  Patient be discharged home with prescription for nausea medication to help maintain hydration.  Suspect viral gastroenteritis causing her symptoms.  Given improvement in symptoms, patient will be discharged home with plans to follow-up with PCP as well as return precautions.  Patient had no other worsens or concerns and was discharged at with improved symptoms.   Final Clinical Impressions(s) / ED Diagnoses    Final diagnoses:  Nausea vomiting and diarrhea    ED Discharge Orders        Ordered    ondansetron (ZOFRAN) 4 MG tablet  Every 8 hours PRN     01/31/18 1541     Clinical Impression: 1. Nausea vomiting and diarrhea     Disposition: Discharge  Condition: Good  I  have discussed the results, Dx and Tx plan with the pt(& family if present). He/she/they expressed understanding and agree(s) with the plan. Discharge instructions discussed at great length. Strict return precautions discussed and pt &/or family have verbalized understanding of the instructions. No further questions at time of discharge.    Discharge Medication List as of 01/31/2018  3:42 PM    START taking these medications   Details  ondansetron (ZOFRAN) 4 MG tablet Take 1 tablet (4 mg total) by mouth every 8 (eight) hours as needed., Starting Sun 01/31/2018, Print        Follow Up: Pearson Grippe, MD [address] [place] [place] [address] [phone]     [facility] [id number] [place] [address] [phone]       Tegeler, Canary Brim, MD 01/31/18 [phone]

## 2018-01-31 NOTE — Discharge Instructions (Signed)
Your workup today was overall reassuring.  She did not have evidence of severe electrolyte abnormality or dehydration despite the nausea, vomiting, diarrhea and abdominal discomfort you had.  We suspect you have a viral infection causing her symptoms.  Please use the nausea medicine prescribed at discharge to maintain hydration and follow-up with your primary doctor in several days.  If any symptoms change or worsen, please return to the nearest emergency department.

## 2018-01-31 NOTE — ED Notes (Signed)
Pt given gingerale and graham crackers with verbal approval from Dr. Rush Landmarkegeler.

## 2018-01-31 NOTE — ED Triage Notes (Signed)
Patient states that she has had N/V/D x 2 days  - patient is unable to keep anything down

## 2018-02-01 LAB — URINE CULTURE: Culture: NO GROWTH

## 2018-02-18 ENCOUNTER — Ambulatory Visit: Payer: Federal, State, Local not specified - PPO | Admitting: Podiatry

## 2018-03-04 ENCOUNTER — Ambulatory Visit: Payer: Federal, State, Local not specified - PPO | Admitting: Podiatry

## 2018-03-04 ENCOUNTER — Encounter: Payer: Self-pay | Admitting: Podiatry

## 2018-03-04 VITALS — BP 155/91 | HR 64 | Ht 68.0 in | Wt 196.0 lb

## 2018-03-04 DIAGNOSIS — M79671 Pain in right foot: Secondary | ICD-10-CM

## 2018-03-04 DIAGNOSIS — B351 Tinea unguium: Secondary | ICD-10-CM

## 2018-03-04 DIAGNOSIS — M79672 Pain in left foot: Secondary | ICD-10-CM

## 2018-03-04 NOTE — Progress Notes (Signed)
SUBJECTIVE: 72 y.o. year old female presents complaining of painful nails. Wearing compression stockings due to Varicose veins. She is have vein stripping done in June. History of bilateral bunion surgery 2014.  Review of Systems  Constitutional: Negative.   HENT: Negative.   Respiratory: Negative.   Cardiovascular: Positive for leg swelling. Negative for chest pain, palpitations and orthopnea.       Swell if not wearing compression stockings.  Gastrointestinal: Negative.   Musculoskeletal: Negative.   Skin: Negative.      OBJECTIVE: DERMATOLOGIC EXAMINATION: Nails: Dystrophic and painful 5th bilateral. Left great toe nail has ingrown without infection.   VASCULAR EXAMINATION OF LOWER LIMBS: All pedal pulses are palpable with normal pulsation.  Capillary Filling times within 3 seconds in all digits.  No edema or erythema noted. Temperature gradient from tibial crest to dorsum of foot is within normal bilateral.  NEUROLOGIC EXAMINATION OF THE LOWER LIMBS: All epicritic and tactile sensations grossly intact. Sharp and Dull discriminatory sensations at the plantar ball of hallux is intact bilateral.   MUSCULOSKELETAL EXAMINATION: No gross deformities noted.  ASSESSMENT: Onychomycosis bilateral. Pain in toes.  PLAN: Reviewed findings and available treatment options. As per request all nails debrided.

## 2018-03-04 NOTE — Patient Instructions (Signed)
Seen for hypertrophic and painful nails. All nails debrided. Return in 3 months or as needed.  

## 2018-03-06 ENCOUNTER — Encounter (HOSPITAL_BASED_OUTPATIENT_CLINIC_OR_DEPARTMENT_OTHER): Payer: Self-pay | Admitting: Emergency Medicine

## 2018-03-06 ENCOUNTER — Other Ambulatory Visit: Payer: Self-pay

## 2018-03-06 ENCOUNTER — Emergency Department (HOSPITAL_BASED_OUTPATIENT_CLINIC_OR_DEPARTMENT_OTHER)
Admission: EM | Admit: 2018-03-06 | Discharge: 2018-03-06 | Disposition: A | Discharge status: Home / Self Care | Payer: Federal, State, Local not specified - PPO | Attending: Emergency Medicine | Admitting: Emergency Medicine

## 2018-03-06 ENCOUNTER — Emergency Department (HOSPITAL_BASED_OUTPATIENT_CLINIC_OR_DEPARTMENT_OTHER): Payer: Federal, State, Local not specified - PPO

## 2018-03-06 DIAGNOSIS — E876 Hypokalemia: Secondary | ICD-10-CM | POA: Diagnosis not present

## 2018-03-06 DIAGNOSIS — E860 Dehydration: Secondary | ICD-10-CM | POA: Insufficient documentation

## 2018-03-06 DIAGNOSIS — R05 Cough: Secondary | ICD-10-CM | POA: Insufficient documentation

## 2018-03-06 DIAGNOSIS — Z7982 Long term (current) use of aspirin: Secondary | ICD-10-CM | POA: Insufficient documentation

## 2018-03-06 DIAGNOSIS — I471 Supraventricular tachycardia, unspecified: Secondary | ICD-10-CM

## 2018-03-06 DIAGNOSIS — Z96653 Presence of artificial knee joint, bilateral: Secondary | ICD-10-CM | POA: Insufficient documentation

## 2018-03-06 DIAGNOSIS — R197 Diarrhea, unspecified: Secondary | ICD-10-CM

## 2018-03-06 DIAGNOSIS — Z96641 Presence of right artificial hip joint: Secondary | ICD-10-CM | POA: Diagnosis not present

## 2018-03-06 DIAGNOSIS — Z79899 Other long term (current) drug therapy: Secondary | ICD-10-CM | POA: Diagnosis not present

## 2018-03-06 DIAGNOSIS — R002 Palpitations: Secondary | ICD-10-CM | POA: Diagnosis present

## 2018-03-06 HISTORY — DX: Supraventricular tachycardia, unspecified: I47.10

## 2018-03-06 HISTORY — DX: Supraventricular tachycardia: I47.1

## 2018-03-06 LAB — CBC WITH DIFFERENTIAL/PLATELET
BASOS ABS: 0 10*3/uL (ref 0.0–0.1)
BASOS PCT: 0 %
Eosinophils Absolute: 0 10*3/uL (ref 0.0–0.7)
Eosinophils Relative: 0 %
HEMATOCRIT: 46.1 % — AB (ref 36.0–46.0)
Hemoglobin: 16 g/dL — ABNORMAL HIGH (ref 12.0–15.0)
Lymphocytes Relative: 14 %
Lymphs Abs: 0.7 10*3/uL (ref 0.7–4.0)
MCH: 28.7 pg (ref 26.0–34.0)
MCHC: 34.7 g/dL (ref 30.0–36.0)
MCV: 82.6 fL (ref 78.0–100.0)
Monocytes Absolute: 0.8 10*3/uL (ref 0.1–1.0)
Monocytes Relative: 16 %
NEUTROS ABS: 3.4 10*3/uL (ref 1.7–7.7)
NEUTROS PCT: 70 %
Platelets: 270 10*3/uL (ref 150–400)
RBC: 5.58 MIL/uL — AB (ref 3.87–5.11)
RDW: 14.9 % (ref 11.5–15.5)
WBC: 5 10*3/uL (ref 4.0–10.5)

## 2018-03-06 LAB — COMPREHENSIVE METABOLIC PANEL
ALBUMIN: 4.5 g/dL (ref 3.5–5.0)
ALT: 47 U/L (ref 14–54)
AST: 49 U/L — AB (ref 15–41)
Alkaline Phosphatase: 75 U/L (ref 38–126)
Anion gap: 11 (ref 5–15)
BILIRUBIN TOTAL: 1 mg/dL (ref 0.3–1.2)
BUN: 17 mg/dL (ref 6–20)
CALCIUM: 9.7 mg/dL (ref 8.9–10.3)
CO2: 20 mmol/L — ABNORMAL LOW (ref 22–32)
Chloride: 106 mmol/L (ref 101–111)
Creatinine, Ser: 0.73 mg/dL (ref 0.44–1.00)
GFR calc Af Amer: >60 mL/min (ref >60)
GFR calc non Af Amer: >60 mL/min (ref >60)
GLUCOSE: 114 mg/dL — AB (ref 65–99)
Potassium: 3.1 mmol/L — ABNORMAL LOW (ref 3.5–5.1)
Sodium: 137 mmol/L (ref 135–145)
TOTAL PROTEIN: 7.8 g/dL (ref 6.5–8.1)

## 2018-03-06 LAB — TROPONIN I: Troponin I: <0.03 ng/mL (ref <0.03)

## 2018-03-06 LAB — MAGNESIUM: Magnesium: 1.6 mg/dL — ABNORMAL LOW (ref 1.7–2.4)

## 2018-03-06 MED ORDER — MAGNESIUM SULFATE 2 GM/50ML IV SOLN
2.0000 g | Freq: Once | INTRAVENOUS | Status: AC
  Administered 2018-03-06: 2 g via INTRAVENOUS
  Filled 2018-03-06: qty 50

## 2018-03-06 MED ORDER — POTASSIUM CHLORIDE CRYS ER 20 MEQ PO TBCR
40.0000 meq | EXTENDED_RELEASE_TABLET | Freq: Once | ORAL | Status: AC
  Administered 2018-03-06: 40 meq via ORAL
  Filled 2018-03-06: qty 2

## 2018-03-06 MED ORDER — SODIUM CHLORIDE 0.9 % IV BOLUS
1000.0000 mL | Freq: Once | INTRAVENOUS | Status: AC
  Administered 2018-03-06: 1000 mL via INTRAVENOUS

## 2018-03-06 MED ORDER — LACTATED RINGERS IV BOLUS
1000.0000 mL | Freq: Once | INTRAVENOUS | Status: AC
  Administered 2018-03-06: 1000 mL via INTRAVENOUS

## 2018-03-06 NOTE — ED Triage Notes (Signed)
Patient states that she has had heart palpitations since Thursday - it is causing her to have SOB

## 2018-03-06 NOTE — Discharge Instructions (Addendum)
If diarrhea returns you can take Imodium

## 2018-03-06 NOTE — ED Notes (Signed)
Ambulatory with steady gait at time of discharge

## 2018-03-06 NOTE — ED Provider Notes (Signed)
[facility] EMERGENCY DEPARTMENT Provider Note   CSN: [id number] Arrival date & time: 03/06/18  1112     History   Chief Complaint Chief Complaint  Patient presents with  . Palpitations    HPI Christina Duarte is a 72 y.o. female.  Patient is a 72 year old female with a history of anemia, heart palpitations currently on metoprolol who presents today with multiple symptoms.  She states since Thursday which is 3 days prior to arrival she developed diarrhea, palpitations.  Patient has had decreased appetite, general malaise but denies any urinary symptoms.  She has not had nausea or vomiting.  She has tried to drink fluids but has not been eating.  No blood in the stool, recent antibiotics or travel outside the country.  She is unaware of any bad food exposure.  She states today the palpitations continued and when she would get up and move around everything with turn black and she would feel like she was going to pass out.  She is taking all of her morning medications and still did not feel better.  The history is provided by the patient.  Palpitations   This is a recurrent problem. Episode onset: 3 days ago. The problem occurs hourly. The problem has not changed since onset.Associated with: diarrhea, poor appetite. Associated symptoms include weakness and cough. Pertinent negatives include no fever, no chest pain, no chest pressure, no abdominal pain, no nausea, no vomiting, no leg pain, no lower extremity edema, no shortness of breath and no sputum production. Associated symptoms comments: Near syncope . She has tried bed rest for the symptoms. The treatment provided no relief.    Past Medical History:  Diagnosis Date  . 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 Blood transfusion   . Complication of anesthesia    woke up during colonoscopy  . H/O blood clots    RIGHT KNEE  . Heart palpitations   . PVC's (premature ventricular  contractions)    HX OF HEART PALPITATIONS AND PVC'S  . PVC's (premature ventricular contractions)    PT STATES IF SHE NEEDS HEART DOCTOR WHILE IN HOSP-SHE WANTS DR. PANG CALLED-HER MEDICAL DOCTOR AND DR. Allyson Sabal    Patient Active Problem List   Diagnosis Date Noted  . Varicose veins of bilateral lower extremities with other complications 01/04/2018  . Status post foot surgery 01/31/2013  . Postop Hypokalemia 08/12/2012  . OA (osteoarthritis) of hip 08/11/2012  . Hyponatremia 09/30/2011  . Osteoarthritis of left knee 09/30/2011    Past Surgical History:  Procedure Laterality Date  . BUNIONECTOMY Right 02/17/11   [facility]ride  . BUNIONECTOMY Right 01/20/13   [facility]Bride  . COLONOSCOPY    . FOOT SURGERY  2012   RIGHT -BUNIONECTOMY  . Hammer toe repair Right 02/17/11  . HYSTEROSCOPY W/D&C N/A 09/13/2013   Procedure: DILATATION AND CURETTAGE /HYSTEROSCOPY;  Surgeon: Miguel Aschoff, MD;  Location: [facility];  Service: Gynecology;  Laterality: N/A;  . JOINT REPLACEMENT  2007    RIGHT KNEE  . TONSILLECTOMY  1964  . TOTAL HIP ARTHROPLASTY  08/11/2012   Procedure: TOTAL HIP ARTHROPLASTY;  Surgeon: Loanne Drilling, MD;  Location: [facility];  Service: Orthopedics;  Laterality: Right;  . TOTAL KNEE ARTHROPLASTY  09/29/2011   Procedure: TOTAL KNEE ARTHROPLASTY;  Surgeon: Loanne Drilling;  Location: [facility];  Service: Orthopedics;  Laterality: Left;  . TUBAL LIGATION  1981     OB History  None      Home Medications    Prior to Admission medications   Medication Sig Start Date End Date Taking? Authorizing Provider  Acetaminophen (TYLENOL) 325 MG CAPS Tylenol    [provider]  aspirin 81 MG tablet Take 81 mg by mouth daily.    [provider]  atorvastatin (LIPITOR) 40 MG tablet atorvastatin 40 mg tablet    [provider]  Cholecalciferol (VITAMIN D-3) 5000 UNITS TABS Take 1 tablet by mouth daily.     [provider]  cyanocobalamin ([employer] VITAMIN B12) 1000 MCG tablet  cyanocobalamin (vitamin B-12)    [provider]  metoprolol succinate (TOPROL-XL) 50 MG 24 hr tablet metoprolol succinate ER 50 mg tablet,extended release 24 hr    [provider]  Multiple Vitamins-Minerals (MULTIVITAMIN ADULT PO) multivitamin    [provider]    Family History Family History  Problem Relation Age of Onset  . Heart disease Father   . Colon cancer Neg Hx     Social History Social History   Tobacco Use  . Smoking status: Never Smoker  . Smokeless tobacco: Never Used  Substance Use Topics  . Alcohol use: No  . Drug use: No     Allergies   Aspirin and Codeine   Review of Systems Review of Systems  Constitutional: Negative for fever.  Respiratory: Positive for cough. Negative for sputum production and shortness of breath.        Patient's grandson currently lives with her and since being in daycare he has been sick since April.  She has had ongoing upper respiratory congestion and cough since that time as well.  She denies any shortness of breath currently or worsening cough  Cardiovascular: Positive for palpitations. Negative for chest pain.  Gastrointestinal: Negative for abdominal pain, nausea and vomiting.  Neurological: Positive for weakness.  All other systems reviewed and are negative.    Physical Exam Updated Vital Signs BP 112/89 (BP Location: Left Arm)   Pulse (!) 148   Temp 98.4 F (36.9 C)   Resp 18   Wt 88.9 kg (196 lb)   SpO2 100%   BMI 29.80 kg/m   Physical Exam  Constitutional: She is oriented to person, place, and time. She appears well-developed and well-nourished. No distress.  HENT:  Head: Normocephalic and atraumatic.  Mouth/Throat: Oropharynx is clear and moist.  Eyes: Pupils are equal, round, and reactive to light. Conjunctivae and EOM are normal.  Neck: Normal range of motion. Neck supple.  Cardiovascular: Normal rate, regular rhythm and intact distal pulses.  No murmur  heard. Pulmonary/Chest: Effort normal and breath sounds normal. No respiratory distress. She has no wheezes. She has no rales.  Abdominal: Soft. She exhibits no distension. There is no tenderness. There is no rebound and no guarding.  Musculoskeletal: Normal range of motion. She exhibits no edema or tenderness.  Neurological: She is alert and oriented to person, place, and time.  Skin: Skin is warm and dry. No rash noted. No erythema.  Psychiatric: She has a normal mood and affect. Her behavior is normal.  Nursing note and vitals reviewed.    ED Treatments / Results  Labs (all labs ordered are listed, but only abnormal results are displayed) Labs Reviewed  CBC WITH DIFFERENTIAL/PLATELET - Abnormal; Notable for the following components:      Result Value   RBC 5.58 (*)    Hemoglobin 16.0 (*)    HCT 46.1 (*)    All other components  within normal limits  COMPREHENSIVE METABOLIC PANEL - Abnormal; Notable for the following components:   Potassium 3.1 (*)    CO2 20 (*)    Glucose, Bld 114 (*)    AST 49 (*)    All other components within normal limits  MAGNESIUM - Abnormal; Notable for the following components:   Magnesium 1.6 (*)    All other components within normal limits  TROPONIN I    EKG EKG Interpretation  Date/Time:  Saturday Mar 06 2018 11:23:37 EDT Ventricular Rate:  137 PR Interval:    QRS Duration: 87 QT Interval:  315 QTC Calculation: 476 R Axis:   55 Text Interpretation:  Supraventricular tachycardia Probable anteroseptal infarct, old Nonspecific repol abnormality, lateral leads Baseline wander in lead(s) V1 Confirmed by Gwyneth Sprout ([id number]) on 03/06/2018 12:07:00 PM  EKG Interpretation  Date/Time:  Saturday Mar 06 2018 11:27:35 EDT Ventricular Rate:  80 PR Interval:    QRS Duration: 82 QT Interval:  359 QTC Calculation: 415 R Axis:   49 Text Interpretation:  Sinus rhythm Consider right atrial enlargement Probable anteroseptal infarct, recent  Supraventricular tachycardia RESOLVED SINCE PREVIOUS Confirmed by Gwyneth Sprout ([id number]) on 03/06/2018 12:07:52 PM  Radiology Dg Chest 2 View  Result Date: 03/06/2018 CLINICAL DATA:  72 year old female with history of heart palpitations and pre syncopal episodes. EXAM: CHEST - 2 VIEW COMPARISON:  Chest x-ray 12/25/2016. FINDINGS: Lung volumes are normal. Eventration of the right hemidiaphragm, similar to the prior study. No consolidative airspace disease. No pleural effusions. No pneumothorax. No pulmonary nodule or mass noted. Pulmonary vasculature and the cardiomediastinal silhouette are within normal limits. IMPRESSION: No radiographic evidence of acute cardiopulmonary disease. Electronically Signed   By: Trudie Reed M.D.   On: 03/06/2018 12:08    Procedures Procedures (including critical care time)  Medications Ordered in ED Medications  sodium chloride 0.9 % bolus 1,000 mL (1,000 mLs Intravenous New Bag/Given 03/06/18 1212)  potassium chloride SA (K-DUR,KLOR-CON) CR tablet 40 mEq (has no administration in time range)  magnesium sulfate IVPB 2 g 50 mL (has no administration in time range)  lactated ringers bolus 1,000 mL (has no administration in time range)     Initial Impression / Assessment and Plan / ED Course  I have reviewed the triage vital signs and the nursing notes.  Pertinent labs & imaging results that were available during my care of the patient were reviewed by me and considered in my medical decision making (see chart for details).     Elderly female presenting today with 3 days of diarrhea and heart palpitations.  Upon arrival here patient initially in SVT versus a flutter with heart rates between 1 40-1 60.  When stuck with the IV patient vagaled and went back into a normal sinus rhythm.  Patient has been having diarrhea for the last 4 days and decreased p.o. intake.  She has no abdominal pain on exam today concerning for diverticulitis, perforation, obstruction.   She is currently denying any chest pain or shortness of breath but has had URI symptoms for the last month. Patient's vital signs are now stable with heart rate less than 100 and normal blood pressure.  We will give IV fluids.  CBC, CMP to check for anemia and electrolyte abnormalities are pending.  Troponin and chest x-ray also pending.  She has already taken her metoprolol this morning  12:32 PM Imaging without acute findings.  CBC with out evidence of anemia but signs of hemoconcentration.  CMP with hypokalemia of  3.1 and also mild hypomagnesemia.  Patient replaced with oral potassium and magnesium.  Patient given 1 more bolus but she is feeling much better.  Final Clinical Impressions(s) / ED Diagnoses   Final diagnoses:  SVT (supraventricular tachycardia) (HCC)  Dehydration  Hypokalemia  Hypomagnesemia  Diarrhea, unspecified type    ED Discharge Orders    None       Gwyneth Sprout, MD 03/06/18 1310

## 2018-05-04 ENCOUNTER — Ambulatory Visit (HOSPITAL_COMMUNITY)
Admission: RE | Admit: 2018-05-04 | Discharge: 2018-05-04 | Disposition: A | Discharge status: Home / Self Care | Payer: Federal, State, Local not specified - PPO | Source: Ambulatory Visit | Attending: Vascular Surgery | Admitting: Vascular Surgery

## 2018-05-04 DIAGNOSIS — I872 Venous insufficiency (chronic) (peripheral): Secondary | ICD-10-CM | POA: Insufficient documentation

## 2018-05-04 DIAGNOSIS — R609 Edema, unspecified: Secondary | ICD-10-CM | POA: Insufficient documentation

## 2018-05-04 DIAGNOSIS — I83893 Varicose veins of bilateral lower extremities with other complications: Secondary | ICD-10-CM | POA: Diagnosis not present

## 2018-05-11 ENCOUNTER — Other Ambulatory Visit: Payer: Self-pay

## 2018-05-11 ENCOUNTER — Ambulatory Visit: Payer: Federal, State, Local not specified - PPO | Admitting: Vascular Surgery

## 2018-05-11 ENCOUNTER — Encounter: Payer: Self-pay | Admitting: Vascular Surgery

## 2018-05-11 VITALS — BP 159/84 | HR 48 | Temp 97.2°F | Resp 18 | Ht 68.0 in | Wt 200.2 lb

## 2018-05-11 DIAGNOSIS — I83813 Varicose veins of bilateral lower extremities with pain: Secondary | ICD-10-CM

## 2018-05-11 NOTE — Progress Notes (Signed)
Patient is a 72 year old female who returns for follow-up today.  She was last seen by my partner Dr. Hart Rochester March 2019.  At that time her main complaint was of varicose veins with a bleeding episode.  She bled from her right leg from a varicosity in February 2019.  She has not had any significant bleeding episodes since that time.  She has been compliant wearing long leg lower extremity compression stockings.  She does still get some swelling in both lower extremities.  She has some aches and pains in both lower extremities at the end of the day.  She denies prior history of DVT.  She has had slow progressive changes in worsening of her legs over the last 10 years.  Past Medical History:  Diagnosis Date  . 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 Blood transfusion   . Complication of anesthesia    woke up during colonoscopy  . H/O blood clots    RIGHT KNEE  . Heart palpitations   . PVC's (premature ventricular contractions)    HX OF HEART PALPITATIONS AND PVC'S  . PVC's (premature ventricular contractions)    PT STATES IF SHE NEEDS HEART DOCTOR WHILE IN HOSP-SHE WANTS DR. PANG CALLED-HER MEDICAL DOCTOR AND DR. Allyson Sabal    Past Surgical History:  Procedure Laterality Date  . BUNIONECTOMY Right 02/17/11   [facility]  . BUNIONECTOMY Right 01/20/13   [facility]  . COLONOSCOPY    . FOOT SURGERY  2012   RIGHT -BUNIONECTOMY  . Hammer toe repair Right 02/17/11  . HYSTEROSCOPY W/D&C N/A 09/13/2013   Procedure: DILATATION AND CURETTAGE /HYSTEROSCOPY;  Surgeon: Miguel Aschoff, MD;  Location: [facility];  Service: Gynecology;  Laterality: N/A;  . JOINT REPLACEMENT  2007    RIGHT KNEE  . TONSILLECTOMY  1964  . TOTAL HIP ARTHROPLASTY  08/11/2012   Procedure: TOTAL HIP ARTHROPLASTY;  Surgeon: Loanne Drilling, MD;  Location: [facility];  Service: Orthopedics;  Laterality: Right;  . TOTAL KNEE ARTHROPLASTY  09/29/2011   Procedure: TOTAL KNEE ARTHROPLASTY;  Surgeon: Loanne Drilling;  Location: [facility];  Service: Orthopedics;  Laterality: Left;  . TUBAL LIGATION  1981    Current Outpatient Medications on File Prior to Visit  Medication Sig Dispense Refill  . Acetaminophen (TYLENOL) 325 MG CAPS Tylenol    . aspirin 81 MG tablet Take 81 mg by mouth daily.    Marland Kitchen atorvastatin (LIPITOR) 40 MG tablet atorvastatin 40 mg tablet    . Cholecalciferol (VITAMIN D-3) 5000 UNITS TABS Take 1 tablet by mouth daily.     . cyanocobalamin ([employer] VITAMIN B12) 1000 MCG tablet cyanocobalamin (vitamin B-12)    . metoprolol succinate (TOPROL-XL) 50 MG 24 hr tablet metoprolol succinate ER 50 mg tablet,extended release 24 hr    . Multiple Vitamins-Minerals (MULTIVITAMIN ADULT PO) multivitamin     No current facility-administered medications on file prior to visit.      Physical exam:  Vitals:   05/11/18 0954 05/11/18 1000  BP: (!) 163/87 (!) 159/84  Pulse: (!) 48   Resp: 18   Temp: (!) 97.2 F (36.2 C)   TempSrc: Oral   SpO2: 100%   Weight: 200 lb 3.2 oz (90.8 kg)   Height: 5\' 8"  (1.727 m)    Lower extremities: 2+ dorsalis pedis pulses bilaterally.  Large clusters of varicosities left posterior lateral right anterior and lateral.  These are 4 to 5 mm in diameter.  Skin: No ulceration  Data: Patient had a venous reflux exam and duplex ultrasound today which showed no evidence of DVT.  She had diffuse reflux in the greater saphenous vein bilaterally.  Right side was 7 to 10 mm left 6 to 10 mm.  She also had bilateral common femoral vein reflux.  Assessment: Bilateral symptomatic varicose veins with diffuse superficial venous reflux in the greater saphenous vein prior bleeding episode with current symptoms of swelling and aching in both legs.  Right is worse than the left.  Plan: The patient will be scheduled in the near future for laser ablation of the right greater saphenous with interval stab avulsions followed by a similar procedure in the left leg.  Risk benefits possible  complications and procedure details were discussed with the patient today she wishes to proceed.  Fabienne Brunsharles Fields, MD [facility] [place] [facility] Office: [phone] Pager: [phone]

## 2018-05-13 ENCOUNTER — Encounter (HOSPITAL_COMMUNITY): Payer: Federal, State, Local not specified - PPO

## 2018-05-13 ENCOUNTER — Ambulatory Visit: Payer: Federal, State, Local not specified - PPO | Admitting: Vascular Surgery

## 2018-05-19 ENCOUNTER — Other Ambulatory Visit: Payer: Self-pay | Admitting: *Deleted

## 2018-05-19 DIAGNOSIS — I83813 Varicose veins of bilateral lower extremities with pain: Secondary | ICD-10-CM

## 2018-06-30 ENCOUNTER — Other Ambulatory Visit: Payer: Federal, State, Local not specified - PPO | Admitting: Vascular Surgery

## 2018-07-06 ENCOUNTER — Ambulatory Visit (INDEPENDENT_AMBULATORY_CARE_PROVIDER_SITE_OTHER): Payer: Federal, State, Local not specified - PPO | Admitting: Podiatry

## 2018-07-06 ENCOUNTER — Encounter: Payer: Self-pay | Admitting: Podiatry

## 2018-07-06 DIAGNOSIS — B351 Tinea unguium: Secondary | ICD-10-CM

## 2018-07-06 DIAGNOSIS — M79672 Pain in left foot: Secondary | ICD-10-CM

## 2018-07-06 DIAGNOSIS — M79671 Pain in right foot: Secondary | ICD-10-CM

## 2018-07-06 NOTE — Progress Notes (Signed)
SUBJECTIVE: 73 Year old female presents requesting toe nails trimmed. Having a laser surgery done on right lower limb tomorrow and left side in November. Patient is wearing compression stockinets.  OBJECTIVE: DERMATOLOGIC EXAMINATION: Yellow dystrophic nails x 10. Pain in both great toes.  VASCULAR EXAMINATION OF LOWER LIMBS: Pedal pulses are all palpable. No edema or erythema noted.  NEUROLOGIC EXAMINATION OF THE LOWER LIMBS: All epicritic and tactile sensations grossly intact. Sharp and Dull discriminatory sensations at the plantar ball of hallux is intact bilateral.   MUSCULOSKELETAL EXAMINATION: No gross deformities noted.  ASSESSMENT: Mycotic nails x 10. Pain in both great toes.  PLAN: Reviewed findings and available treatment options. All nails debrided.

## 2018-07-06 NOTE — Patient Instructions (Signed)
Seen for hypertrophic nails. All nails debrided. Return in 3 months or as needed.  

## 2018-07-07 ENCOUNTER — Encounter (HOSPITAL_COMMUNITY): Payer: Federal, State, Local not specified - PPO

## 2018-07-07 ENCOUNTER — Ambulatory Visit: Payer: Federal, State, Local not specified - PPO | Admitting: Vascular Surgery

## 2018-07-07 ENCOUNTER — Other Ambulatory Visit: Payer: Self-pay

## 2018-07-07 ENCOUNTER — Encounter: Payer: Self-pay | Admitting: Vascular Surgery

## 2018-07-07 ENCOUNTER — Ambulatory Visit (INDEPENDENT_AMBULATORY_CARE_PROVIDER_SITE_OTHER): Payer: Federal, State, Local not specified - PPO | Admitting: Vascular Surgery

## 2018-07-07 VITALS — BP 143/76 | HR 68 | Temp 97.8°F | Resp 18 | Ht 68.0 in | Wt 201.9 lb

## 2018-07-07 DIAGNOSIS — I83811 Varicose veins of right lower extremities with pain: Secondary | ICD-10-CM | POA: Diagnosis not present

## 2018-07-07 HISTORY — PX: ENDOVENOUS ABLATION SAPHENOUS VEIN W/ LASER: SUR449

## 2018-07-07 NOTE — Progress Notes (Signed)
     Laser Ablation Procedure    Date: 07/07/2018   Christina Duarte DOB:29-Mar-1946  Consent signed: Yes    Surgeon:  Dr. Fabienne Bruns   Procedure: Laser Ablation: right Greater Saphenous Vein  BP (!) 143/76 (BP Location: Left Arm, Patient Position: Sitting, Cuff Size: Large)   Pulse 68   Temp 97.8 F (36.6 C) (Oral)   Resp 18   Ht 5\' 8"  (1.727 m)   Wt 201 lb 14.4 oz (91.6 kg)   SpO2 98%   BMI 30.70 kg/m   Tumescent Anesthesia: 1300  cc 0.9% NaCl with 50 cc Lidocaine HCL 1% and 15 cc 8.4% NaHCO3  Local Anesthesia: 20 cc Lidocaine HCL and NaHCO3 (ratio 2:1)  15 watts continuous mode        Total energy: 2584 Joules   Total time: 2:51   Stab Phlebectomy: >20 Sites: Calf and Ankle  Patient tolerated procedure well    Description of Procedure:  After marking the course of the secondary varicosities, the patient was placed on the operating table in the supine position, and the right leg was prepped and draped in sterile fashion.   Local anesthetic was administered and under ultrasound guidance the saphenous vein was accessed with a micro needle and guide wire; then the mirco puncture sheath was placed.  A guide wire was inserted saphenofemoral junction , followed by a 5 french sheath.  The position of the sheath and then the laser fiber below the junction was confirmed using the ultrasound.  Tumescent anesthesia was administered along the course of the saphenous vein using ultrasound guidance. The patient was placed in Trendelenburg position and protective laser glasses were placed on patient and staff, and the laser was fired at 15 watts continuous mode advancing 1-42mm/second for a total of 2584 joules.   For stab phlebectomies, local anesthetic was administered at the previously marked varicosities, and tumescent anesthesia was administered around the vessels.  Greater than 20 stab wounds were made using the tip of an 11 blade. And using the vein hook, the phlebectomies were  performed using a hemostat to avulse the varicosities.  Adequate hemostasis was achieved.     Steri strips were applied to the stab wounds and ABD pads and thigh high compression stockings were applied.  Ace wrap bandages were applied over the phlebectomy sites and at the top of the saphenofemoral junction. Blood loss was less than 15 cc.  The patient ambulated out of the operating room having tolerated the procedure well.  Fabienne Bruns, MD [facility] [place] Office: [phone] Pager: [phone]

## 2018-07-14 ENCOUNTER — Ambulatory Visit (HOSPITAL_COMMUNITY)
Admission: RE | Admit: 2018-07-14 | Discharge: 2018-07-14 | Disposition: A | Discharge status: Home / Self Care | Payer: Federal, State, Local not specified - PPO | Source: Ambulatory Visit | Attending: Surgery | Admitting: Surgery

## 2018-07-14 ENCOUNTER — Ambulatory Visit (INDEPENDENT_AMBULATORY_CARE_PROVIDER_SITE_OTHER): Payer: Self-pay | Admitting: Vascular Surgery

## 2018-07-14 ENCOUNTER — Other Ambulatory Visit: Payer: Self-pay | Admitting: *Deleted

## 2018-07-14 ENCOUNTER — Other Ambulatory Visit: Payer: Self-pay

## 2018-07-14 VITALS — BP 132/75 | HR 56 | Temp 98.3°F | Resp 16 | Ht 70.0 in | Wt 199.0 lb

## 2018-07-14 DIAGNOSIS — I83813 Varicose veins of bilateral lower extremities with pain: Secondary | ICD-10-CM

## 2018-07-14 DIAGNOSIS — Z9889 Other specified postprocedural states: Secondary | ICD-10-CM | POA: Diagnosis not present

## 2018-07-14 NOTE — Progress Notes (Signed)
Vitals:   07/14/18 1514  BP: (!) 153/79  Pulse: (!) 56  Resp: 16  Temp: 98.3 F (36.8 C)  TempSrc: Oral  SpO2: 98%  Weight: 199 lb (90.3 kg)  Height: 5\' 10"  (1.778 m)

## 2018-07-14 NOTE — Progress Notes (Signed)
Patient is a 72 year old female who returns today for postoperative follow-up after recent right greater saphenous laser ablation with stab avulsions.  She reports still some minor swelling in the inner thigh right leg but most of the pain has resolved.  She denies any shortness of breath or chest pain.  She has known reflux with varicosities in the left leg as well.  She is considering having laser ablation and stabs done on the left leg as well but wishes to defer this until November or December.  She continues to wear long leg lower extremity compression stockings.  Physical exam:  Vitals:   07/14/18 1514 07/14/18 1518  BP: (!) 153/79 132/75  Pulse: (!) 56 (!) 56  Resp: 16   Temp: 98.3 F (36.8 C)   TempSrc: Oral   SpO2: 98%   Weight: 199 lb (90.3 kg)   Height: 5\' 10"  (1.778 m)     Right lower extremity: Multiple stab incisions no erythema no drainage  Data: Patient had a duplex ultrasound of her right lower extremity today which showed no evidence of DVT.  Right greater saphenous was occluded.  Assessment: Doing well status post laser ablation right greater saphenous as well as multiple stab avulsions.  Plan: The patient will return for laser ablation and stabs in the left leg in a few months.  She will continue to wear lower externally compression stockings.  Fabienne Bruns, MD [facility] [place] Office: [phone] Pager: [phone]

## 2018-07-15 ENCOUNTER — Other Ambulatory Visit: Payer: Self-pay

## 2018-08-30 ENCOUNTER — Other Ambulatory Visit: Payer: Self-pay | Admitting: Obstetrics and Gynecology

## 2018-08-30 DIAGNOSIS — Z1231 Encounter for screening mammogram for malignant neoplasm of breast: Secondary | ICD-10-CM

## 2018-10-05 ENCOUNTER — Ambulatory Visit: Payer: Medicare Other | Admitting: Podiatry

## 2018-10-06 ENCOUNTER — Encounter: Payer: Self-pay | Admitting: Vascular Surgery

## 2018-10-06 ENCOUNTER — Other Ambulatory Visit: Payer: Self-pay

## 2018-10-06 ENCOUNTER — Ambulatory Visit (INDEPENDENT_AMBULATORY_CARE_PROVIDER_SITE_OTHER): Payer: Federal, State, Local not specified - PPO | Admitting: Vascular Surgery

## 2018-10-06 VITALS — BP 132/72 | HR 54 | Temp 97.0°F | Resp 16 | Ht 70.0 in | Wt 197.0 lb

## 2018-10-06 DIAGNOSIS — I83812 Varicose veins of left lower extremities with pain: Secondary | ICD-10-CM | POA: Diagnosis not present

## 2018-10-06 HISTORY — PX: ENDOVENOUS ABLATION SAPHENOUS VEIN W/ LASER: SUR449

## 2018-10-06 NOTE — Progress Notes (Signed)
     Laser Ablation Procedure    Date: 10/06/2018   Christina Duarte DOB:08/31/1946  Consent signed: Yes    Surgeon:  Dr. Fabienne Brunsharles Fields   Procedure: Laser Ablation: left Greater Saphenous Vein  BP 132/72 (BP Location: Left Arm, Patient Position: Sitting, Cuff Size: Large)   Pulse (!) 54   Temp (!) 97 F (36.1 C) (Oral)   Resp 16   Ht 5\' 10"  (1.778 m)   Wt 197 lb (89.4 kg)   SpO2 98%   BMI 28.27 kg/m   Tumescent Anesthesia: 600 cc 0.9% NaCl with 50 cc Lidocaine HCL 1% and 15 cc 8.4% NaHCO3  Local Anesthesia: 14 cc Lidocaine HCL and NaHCO3 (ratio 2:1)  15 watts continuous mode        Total energy: 1943 Joules   Total time: 2:09    Stab Phlebectomy: >20 Sites: Thigh, Calf and Ankle  Patient tolerated procedure well    Description of Procedure:  After marking the course of the secondary varicosities, the patient was placed on the operating table in the supine position, and the left leg was prepped and draped in sterile fashion.   Local anesthetic was administered and under ultrasound guidance the saphenous vein was accessed with a micro needle and guide wire; then the mirco puncture sheath was placed.  A guide wire was inserted saphenofemoral junction , followed by a 5 french sheath.  The position of the sheath and then the laser fiber below the junction was confirmed using the ultrasound.  Tumescent anesthesia was administered along the course of the saphenous vein using ultrasound guidance. The patient was placed in Trendelenburg position and protective laser glasses were placed on patient and staff, and the laser was fired at 15 watts continuous mode advancing 1-12mm/second for a total of 1943 joules.   For stab phlebectomies, local anesthetic was administered at the previously marked varicosities, and tumescent anesthesia was administered around the vessels.  Greater than 20 stab wounds were made using the tip of an 11 blade. And using the vein hook, the phlebectomies were  performed using a hemostat to avulse the varicosities.  Adequate hemostasis was achieved.     Steri strips were applied to the stab wounds and ABD pads and thigh high compression stockings were applied.  Ace wrap bandages were applied over the phlebectomy sites and at the top of the saphenofemoral junction. Blood loss was less than 15 cc.  The patient ambulated out of the operating room having tolerated the procedure well.  Fabienne Brunsharles Fields, MD [facility] [place] Office: [phone] Pager: [phone]

## 2018-10-07 ENCOUNTER — Encounter: Payer: Self-pay | Admitting: Vascular Surgery

## 2018-10-08 ENCOUNTER — Telehealth: Payer: Self-pay | Admitting: *Deleted

## 2018-10-08 NOTE — Telephone Encounter (Signed)
Returning Christina Duarte's phone message regarding bleeding from left leg incision site when she removed her compression dressing today. Christina Duarte is s/p endovenous laser ablation left greater saphenous vein and stab phlebectomy > 20 incisions left thigh, calf and ankle by Christina Duarte on 10-06-2018. Christina Duarte states that bleeding from left leg incision site has stopped. She applied direct pressure and covered the site with gauze pad and reapplied compression hose. Affirmed with Christina Duarte that she had followed appropriate steps to stop bleeding. Will follow prn.

## 2018-10-11 ENCOUNTER — Ambulatory Visit
Admission: RE | Admit: 2018-10-11 | Discharge: 2018-10-11 | Disposition: A | Discharge status: Home / Self Care | Payer: Federal, State, Local not specified - PPO | Source: Ambulatory Visit | Attending: Obstetrics and Gynecology | Admitting: Obstetrics and Gynecology

## 2018-10-11 DIAGNOSIS — Z1231 Encounter for screening mammogram for malignant neoplasm of breast: Secondary | ICD-10-CM

## 2018-10-13 ENCOUNTER — Other Ambulatory Visit: Payer: Self-pay

## 2018-10-13 ENCOUNTER — Ambulatory Visit (INDEPENDENT_AMBULATORY_CARE_PROVIDER_SITE_OTHER): Payer: Federal, State, Local not specified - PPO | Admitting: Vascular Surgery

## 2018-10-13 ENCOUNTER — Ambulatory Visit (HOSPITAL_COMMUNITY)
Admission: RE | Admit: 2018-10-13 | Discharge: 2018-10-13 | Disposition: A | Discharge status: Home / Self Care | Payer: Federal, State, Local not specified - PPO | Source: Ambulatory Visit | Attending: Vascular Surgery | Admitting: Vascular Surgery

## 2018-10-13 ENCOUNTER — Encounter: Payer: Self-pay | Admitting: Vascular Surgery

## 2018-10-13 ENCOUNTER — Other Ambulatory Visit: Payer: Self-pay | Admitting: Obstetrics and Gynecology

## 2018-10-13 VITALS — BP 152/81 | HR 68 | Resp 18 | Ht 70.0 in | Wt 205.3 lb

## 2018-10-13 DIAGNOSIS — I83813 Varicose veins of bilateral lower extremities with pain: Secondary | ICD-10-CM

## 2018-10-13 DIAGNOSIS — R928 Other abnormal and inconclusive findings on diagnostic imaging of breast: Secondary | ICD-10-CM

## 2018-10-13 NOTE — Progress Notes (Signed)
Patient is a 72 year old female who returns for follow-up today after laser ablation of her left greater saphenous vein October 06, 2018.  She also underwent stab avulsions at that time.  She had previously undergone laser ablation of the right greater saphenous vein with stab avulsions number 02/2018.  She reports some soreness in the inner thigh on the left side.  Otherwise she is doing well.  Data: Patient had a duplex ultrasound today which shows the left greater saphenous vein successfully closed within 2 cm and saphenofemoral junction.  Physical exam:   Vitals:   10/13/18 1054  BP: (!) 152/81  Pulse: 68  Resp: 18  SpO2: 99%  Weight: 205 lb 4.8 oz (93.1 kg)  Height: 5\' 10"  (1.778 m)    Extremities: Mild ecchymosis left thigh no significant edema  Assessment: Doing well status post bilateral laser ablation of the greater saphenous and bilateral stab avulsions.  Plan: The patient will continue to wear her lower extremity compression stockings to up with us on an as-needed basis.  Fabienne Brunsharles Fields, MD [facility] [place] Office: [phone] Pager: [phone]

## 2018-10-18 ENCOUNTER — Ambulatory Visit
Admission: RE | Admit: 2018-10-18 | Discharge: 2018-10-18 | Disposition: A | Discharge status: Home / Self Care | Payer: Federal, State, Local not specified - PPO | Source: Ambulatory Visit | Attending: Obstetrics and Gynecology | Admitting: Obstetrics and Gynecology

## 2018-10-18 ENCOUNTER — Other Ambulatory Visit: Payer: Self-pay | Admitting: Obstetrics and Gynecology

## 2018-10-18 ENCOUNTER — Ambulatory Visit: Payer: Federal, State, Local not specified - PPO

## 2018-10-18 DIAGNOSIS — R921 Mammographic calcification found on diagnostic imaging of breast: Secondary | ICD-10-CM

## 2018-10-18 DIAGNOSIS — R928 Other abnormal and inconclusive findings on diagnostic imaging of breast: Secondary | ICD-10-CM

## 2018-10-25 ENCOUNTER — Other Ambulatory Visit: Payer: Self-pay | Admitting: Obstetrics and Gynecology

## 2018-10-25 ENCOUNTER — Ambulatory Visit
Admission: RE | Admit: 2018-10-25 | Discharge: 2018-10-25 | Disposition: A | Discharge status: Home / Self Care | Payer: Federal, State, Local not specified - PPO | Source: Ambulatory Visit | Attending: Obstetrics and Gynecology | Admitting: Obstetrics and Gynecology

## 2018-10-25 DIAGNOSIS — R921 Mammographic calcification found on diagnostic imaging of breast: Secondary | ICD-10-CM

## 2019-03-11 ENCOUNTER — Encounter: Payer: Self-pay | Admitting: Cardiology

## 2019-03-13 ENCOUNTER — Encounter: Payer: Self-pay | Admitting: Cardiology

## 2019-03-13 DIAGNOSIS — R9431 Abnormal electrocardiogram [ECG] [EKG]: Secondary | ICD-10-CM | POA: Insufficient documentation

## 2019-03-13 NOTE — Progress Notes (Signed)
Virtual Visit via Telephone Note: Patient unable to use video assisted device.  This visit type was conducted due to national recommendations for restrictions regarding the COVID-19 Pandemic (e.g. social distancing).  This format is felt to be most appropriate for this patient at this time.  All issues noted in this document were discussed and addressed.  No physical exam was performed.  The patient has consented to conduct a Telehealth visit and understands insurance will be billed.   I connected with@, on 03/14/19 at  by TELEPHONE and verified that I am speaking with the correct person using two identifiers.   I discussed the limitations of evaluation and management by telemedicine and the availability of in person appointments. The patient expressed understanding and agreed to proceed.   I have discussed with patient regarding the safety during COVID Pandemic and steps and precautions to be taken including social distancing, frequent hand wash and use of detergent soap, gels with the patient. I asked the patient to avoid touching mouth, nose, eyes, ears with the hands. I encouraged regular walking around the neighborhood and exercise and regular diet, as long as social distancing can be maintained.  Primary Physician/Referring:  Jani Gravel, MD  Patient ID: Christina Duarte, female    DOB: 22-Mar-1946, 73 y.o.   MRN: [id number]  Chief Complaint  Patient presents with  . SVT  . Follow-up    17mo   HPI: Christina Duarte is a 73y.o. female  fairly active African-American female with hyperlipidemia, varicose vein followed by Dr. LKellie Simmering degenerative joint disease, hyperlipidemia, chronic palpitations, found to have SVT that resolved with Valsalva maneuver on 03/31/2018.  Patient continues to have sporadic episodes of heart palpitations that last a few minutes which terminate with Valsalva. She is aware that she has occasional skipped beats and thery are PVC and PAC. Has not had prolonged  palpitations like she experienced on the day she went to the emergency room and found to have SVT. Now here to discuss management of PSVT.  States the episodes are short and Self terminated, but she is also aware of doing Valsalva maneuver.  No chest pain or dyspnea.  Past Medical History:  Diagnosis Date  . 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 Blood transfusion   . Complication of anesthesia    woke up during colonoscopy  . H/O blood clots    RIGHT KNEE  . Heart palpitations   . [place] [place]   EKG at the emergency room 03/06/2018: SVT at a rate of 137 bpm,  anteroseptal infarct old.  .Marland KitchenPVC's (premature ventricular contractions)    HX OF HEART PALPITATIONS AND PVC'S  . PVC's (premature ventricular contractions)    PT STATES IF SHE NEEDS HEART DOCTOR WHILE IN HOSP-SHE WANTS DR. PANG CGrover   Past Surgical History:  Procedure Laterality Date  . BUNIONECTOMY Right 02/17/11   McBride  . BUNIONECTOMY Right 01/20/13   McBride  . COLONOSCOPY    . ENDOVENOUS ABLATION SAPHENOUS VEIN W/ LASER Right 07/07/2018   endovenous laser ablation R greater saphenous vein and stab phlebectomy > 20 incisions R leg by CRuta HindsMD   . ENDOVENOUS ABLATION SAPHENOUS VEIN W/ LASER Left 10/06/2018   endovenous laser ablation left greater saphenous vein and stab phlebectomy > 20 incisions left leg by [place] 2012  RIGHT -BUNIONECTOMY  . Hammer toe repair Right 02/17/11  . HYSTEROSCOPY W/D&C N/A 09/13/2013   Procedure: DILATATION AND CURETTAGE /HYSTEROSCOPY;  Surgeon: Gus Height, MD;  Location: [facility];  Service: Gynecology;  Laterality: N/A;  . JOINT REPLACEMENT  2007    RIGHT KNEE  . TONSILLECTOMY  1964  . TOTAL HIP ARTHROPLASTY  08/11/2012   Procedure: TOTAL HIP ARTHROPLASTY;  Surgeon: Gearlean Alf, MD;  Location: [facility];  Service:  Orthopedics;  Laterality: Right;  . TOTAL KNEE ARTHROPLASTY  09/29/2011   Procedure: TOTAL KNEE ARTHROPLASTY;  Surgeon: Gearlean Alf;  Location: [facility];  Service: Orthopedics;  Laterality: Left;  . TUBAL LIGATION  1981    Social History   Socioeconomic History  . Marital status: Divorced    Spouse name: Not on file  . Number of children: 3  . Years of education: Not on file  . Highest education level: Not on file  Occupational History  . Not on file  Social Needs  . Financial resource strain: Not on file  . Food insecurity:    Worry: Not on file    Inability: Not on file  . Transportation needs:    Medical: Not on file    Non-medical: Not on file  Tobacco Use  . Smoking status: Never Smoker  . Smokeless tobacco: Never Used  Substance and Sexual Activity  . Alcohol use: No  . Drug use: No  . Sexual activity: Not on file  Lifestyle  . Physical activity:    Days per week: Not on file    Minutes per session: Not on file  . Stress: Not on file  Relationships  . Social connections:    Talks on phone: Not on file    Gets together: Not on file    Attends religious service: Not on file    Active member of club or organization: Not on file    Attends meetings of clubs or organizations: Not on file    Relationship status: Not on file  . Intimate partner violence:    Fear of current or ex partner: Not on file    Emotionally abused: Not on file    Physically abused: Not on file    Forced sexual activity: Not on file  Other Topics Concern  . Not on file  Social History Narrative  . Not on file    Review of Systems  Constitution: Negative for chills, decreased appetite, malaise/fatigue and weight gain.  Cardiovascular: Positive for leg swelling (occasional) and palpitations. Negative for dyspnea on exertion and syncope.  Endocrine: Negative for cold intolerance.  Hematologic/Lymphatic: Does not bruise/bleed easily.  Musculoskeletal: Negative for joint swelling.   Gastrointestinal: Negative for abdominal pain, anorexia and change in bowel habit.  Neurological: Negative for headaches and light-headedness.  Psychiatric/Behavioral: Negative for depression and substance abuse.  All other systems reviewed and are negative.     Objective  Blood pressure (!) 154/86, pulse 67, height 5' 10"  (1.778 m), weight 197 lb (89.4 kg). Body mass index is 28.27 kg/m. Physical exam not performed or limited due to virtual visit.    Please see exam details from prior visit is as below.    Physical Exam  Constitutional: She appears well-developed and well-nourished. No distress.  HENT:  Head: Atraumatic.  Eyes: Conjunctivae are normal.  Neck: Neck supple. No JVD present. No thyromegaly present.  Cardiovascular: Normal rate, regular rhythm, normal heart sounds, intact distal pulses and normal pulses. Exam reveals no gallop.  No  murmur heard. Pulses:      Carotid pulses are on the right side with bruit and on the left side with bruit. Pulmonary/Chest: Effort normal and breath sounds normal.  Abdominal: Soft. Bowel sounds are normal.  Musculoskeletal: Normal range of motion.  Neurological: She is alert.  Skin: Skin is warm and dry.  Psychiatric: She has a normal mood and affect.   Radiology: No results found.  Laboratory examination:   Labs 04/29/2018: WBC 3.5, CBC otherwise normal.  Potassium 4.1, creatinine 0.57, EGFR 93/107, AST 53, ALT 49, CMP otherwise normal.  CMP Latest Ref Rng & Units 03/06/2018 01/31/2018 09/09/2013  Glucose 65 - 99 mg/dL 114(H) 126(H) 80  BUN 6 - 20 mg/dL 17 28(H) 11  Creatinine 0.44 - 1.00 mg/dL 0.73 0.79 0.70  Sodium 135 - 145 mmol/L 137 139 138  Potassium 3.5 - 5.1 mmol/L 3.1(L) 3.9 4.2  Chloride 101 - 111 mmol/L 106 104 101  CO2 22 - 32 mmol/L 20(L) 23 30  Calcium 8.9 - 10.3 mg/dL 9.7 9.7 9.6  Total Protein 6.5 - 8.1 g/dL 7.8 8.3(H) -  Total Bilirubin 0.3 - 1.2 mg/dL 1.0 1.6(H) -  Alkaline Phos 38 - 126 U/L 75 85 -  AST 15 - 41  U/L 49(H) 45(H) -  ALT 14 - 54 U/L 47 44 -   CBC Latest Ref Rng & Units 03/06/2018 01/31/2018 12/31/2017  WBC 4.0 - 10.5 K/uL 5.0 6.8 4.4  Hemoglobin 12.0 - 15.0 g/dL 16.0(H) 15.6(H) 13.1  Hematocrit 36.0 - 46.0 % 46.1(H) 46.5(H) 39.4  Platelets 150 - 400 K/uL 270 274 222   Lipid Panel  No results found for: CHOL, TRIG, HDL, CHOLHDL, VLDL, LDLCALC, LDLDIRECT HEMOGLOBIN A1C No results found for: HGBA1C, MPG TSH No results for input(s): TSH in the last 8760 hours.  PRN Meds:. Medications Discontinued During This Encounter  Medication Reason  . aspirin 81 MG tablet   . Acetaminophen (TYLENOL) 325 MG CAPS    Current Meds  Medication Sig  . acetaminophen (TYLENOL) 80 MG chewable tablet Chew 80 mg by mouth every 6 (six) hours as needed.  Marland Kitchen aspirin 81 MG tablet Take 81 mg by mouth daily.  Marland Kitchen atorvastatin (LIPITOR) 40 MG tablet atorvastatin 40 mg tablet  . Cholecalciferol (VITAMIN D-3) 5000 UNITS TABS Take 1 tablet by mouth daily.   . cyanocobalamin ([employer] VITAMIN B12) 1000 MCG tablet cyanocobalamin (vitamin B-12)  . metoprolol succinate (TOPROL-XL) 50 MG 24 hr tablet metoprolol succinate ER 50 mg tablet,extended release 24 hr  . Multiple Vitamins-Minerals (MULTIVITAMIN ADULT PO) multivitamin    Cardiac Studies:   Lexiscan sestamibi stress test 08/23/2018: 1. The resting electrocardiogram demonstrated normal sinus rhythm, prolonged QT, no resting arrhythmias and normal rest repolarization.  Stress EKG is non-diagnostic for ischemia as it a pharmacologic stress using Lexiscan. The patient developed significant symptoms which included Headache.  2. Myocardial perfusion imaging is normal. Overall left ventricular systolic function was normal without regional wall motion abnormalities. The left ventricular ejection fraction was 65%.  This is a low risk study.  Carotid artery duplex 08/05/2018: No hemodynamically significant arterial disease in the internal carotid artery bilaterally. Mild soft  plaque noted bilaterally. Antegrade right vertebral artery flow. Antegrade left vertebral artery flow.  2 week event monitor 06/30/2018-07/13/2018: Dominant rhythm Normal sinus. 7 patient triggered events occured without reported symptoms that correlated with NSR with occasional PACs. Brief Episodes of ectopic atrial rhythm (day 1 at 11:43am and day 2 at 1243am). No A fib or SVT  occurred.   Assessment   PSVT (paroxysmal supraventricular tachycardia) (HCC)  Nonspecific abnormal electrocardiogram (ECG) (EKG)  Bilateral carotid bruits  Essential hypertension  Abnormal LFTs  EKG at the emergency room 03/06/2018: SVT at a rate of 137 bpm,  anteroseptal infarct old.  EKG 06/30/2018: Normal sinus rhythm at 60 bpm with borderline left atrial enlargement, normal axis, no evidence of ischemia. Low voltage complexes.  Recommendations:   She has had occasional episodes of SVT, relieved with Valsalva maneuver. She still has occasional skipped beats that are suggestive PACs and PVCs. I have prescribed her verapamil to be used on a p.r.n. basis.  I advised her to keep an eye and monitor her symptoms and if she continues to have frequent And prolonged episodes of SVT, then we can consider ablation. Presently her symptoms are very short-lived.  Advised her to continue taking statins, carotid duplex shows mild soft plaque and low risk nuclear stress test discussed, no evidence of ischemia. She has abnormal LFT dating back couple years, I am not sure about the etiology. This is already been looked into by her PCP, she'll follow-up with Dr. Maudie Mercury sometime in July.  Her blood pressure is elevated today, however patient states that her blood pressure has been very well controlled and today she just took the blood pressure after cleaning the house.  I'll see her back in 6 months.   Adrian Prows, MD, [facility] 03/14/2019, 2:33 PM [facility]. [place] Pager: [phone] Office: [phone] If no answer  Cell [phone]

## 2019-03-14 ENCOUNTER — Encounter: Payer: Self-pay | Admitting: Cardiology

## 2019-03-14 ENCOUNTER — Ambulatory Visit: Payer: Federal, State, Local not specified - PPO | Admitting: Cardiology

## 2019-03-14 ENCOUNTER — Other Ambulatory Visit: Payer: Self-pay

## 2019-03-14 VITALS — BP 154/86 | HR 67 | Ht 70.0 in | Wt 197.0 lb

## 2019-03-14 DIAGNOSIS — R9431 Abnormal electrocardiogram [ECG] [EKG]: Secondary | ICD-10-CM

## 2019-03-14 DIAGNOSIS — R0989 Other specified symptoms and signs involving the circulatory and respiratory systems: Secondary | ICD-10-CM | POA: Diagnosis not present

## 2019-03-14 DIAGNOSIS — I471 Supraventricular tachycardia: Secondary | ICD-10-CM

## 2019-03-14 DIAGNOSIS — R7989 Other specified abnormal findings of blood chemistry: Secondary | ICD-10-CM

## 2019-03-14 DIAGNOSIS — I10 Essential (primary) hypertension: Secondary | ICD-10-CM

## 2019-03-14 DIAGNOSIS — R945 Abnormal results of liver function studies: Secondary | ICD-10-CM

## 2019-03-18 ENCOUNTER — Other Ambulatory Visit: Payer: Self-pay | Admitting: Internal Medicine

## 2019-03-18 DIAGNOSIS — R921 Mammographic calcification found on diagnostic imaging of breast: Secondary | ICD-10-CM

## 2019-04-14 ENCOUNTER — Other Ambulatory Visit: Payer: Self-pay

## 2019-04-14 ENCOUNTER — Other Ambulatory Visit: Payer: Federal, State, Local not specified - PPO

## 2019-04-14 ENCOUNTER — Ambulatory Visit
Admission: RE | Admit: 2019-04-14 | Discharge: 2019-04-14 | Disposition: A | Discharge status: Home / Self Care | Payer: Federal, State, Local not specified - PPO | Source: Ambulatory Visit | Attending: Internal Medicine | Admitting: Internal Medicine

## 2019-04-14 DIAGNOSIS — R921 Mammographic calcification found on diagnostic imaging of breast: Secondary | ICD-10-CM

## 2019-06-11 ENCOUNTER — Emergency Department (HOSPITAL_BASED_OUTPATIENT_CLINIC_OR_DEPARTMENT_OTHER)
Admission: EM | Admit: 2019-06-11 | Discharge: 2019-06-11 | Disposition: A | Discharge status: Home / Self Care | Payer: Medicare Other | Attending: Emergency Medicine | Admitting: Emergency Medicine

## 2019-06-11 ENCOUNTER — Other Ambulatory Visit: Payer: Self-pay

## 2019-06-11 ENCOUNTER — Emergency Department (HOSPITAL_BASED_OUTPATIENT_CLINIC_OR_DEPARTMENT_OTHER): Payer: Medicare Other

## 2019-06-11 DIAGNOSIS — Z79899 Other long term (current) drug therapy: Secondary | ICD-10-CM | POA: Insufficient documentation

## 2019-06-11 DIAGNOSIS — Z7982 Long term (current) use of aspirin: Secondary | ICD-10-CM | POA: Insufficient documentation

## 2019-06-11 DIAGNOSIS — Z96641 Presence of right artificial hip joint: Secondary | ICD-10-CM | POA: Insufficient documentation

## 2019-06-11 DIAGNOSIS — Z96652 Presence of left artificial knee joint: Secondary | ICD-10-CM | POA: Diagnosis not present

## 2019-06-11 DIAGNOSIS — R519 Headache, unspecified: Secondary | ICD-10-CM

## 2019-06-11 DIAGNOSIS — R51 Headache: Secondary | ICD-10-CM | POA: Diagnosis present

## 2019-06-11 DIAGNOSIS — I471 Supraventricular tachycardia: Secondary | ICD-10-CM | POA: Insufficient documentation

## 2019-06-11 LAB — BASIC METABOLIC PANEL
Anion gap: 8 (ref 5–15)
BUN: 15 mg/dL (ref 8–23)
CO2: 26 mmol/L (ref 22–32)
Calcium: 9.6 mg/dL (ref 8.9–10.3)
Chloride: 105 mmol/L (ref 98–111)
Creatinine, Ser: 0.69 mg/dL (ref 0.44–1.00)
GFR calc Af Amer: >60 mL/min (ref >60)
GFR calc non Af Amer: >60 mL/min (ref >60)
Glucose, Bld: 94 mg/dL (ref 70–99)
Potassium: 3.8 mmol/L (ref 3.5–5.1)
Sodium: 139 mmol/L (ref 135–145)

## 2019-06-11 LAB — CBC WITH DIFFERENTIAL/PLATELET
Abs Immature Granulocytes: 0.01 10*3/uL (ref 0.00–0.07)
Basophils Absolute: 0 10*3/uL (ref 0.0–0.1)
Basophils Relative: 1 %
Eosinophils Absolute: 0 10*3/uL (ref 0.0–0.5)
Eosinophils Relative: 1 %
HCT: 44.7 % (ref 36.0–46.0)
Hemoglobin: 14.1 g/dL (ref 12.0–15.0)
Immature Granulocytes: 0 %
Lymphocytes Relative: 20 %
Lymphs Abs: 1.1 10*3/uL (ref 0.7–4.0)
MCH: 27.7 pg (ref 26.0–34.0)
MCHC: 31.5 g/dL (ref 30.0–36.0)
MCV: 87.8 fL (ref 80.0–100.0)
Monocytes Absolute: 0.6 10*3/uL (ref 0.1–1.0)
Monocytes Relative: 11 %
Neutro Abs: 3.7 10*3/uL (ref 1.7–7.7)
Neutrophils Relative %: 67 %
Platelets: 247 10*3/uL (ref 150–400)
RBC: 5.09 MIL/uL (ref 3.87–5.11)
RDW: 15 % (ref 11.5–15.5)
WBC: 5.4 10*3/uL (ref 4.0–10.5)
nRBC: 0 % (ref 0.0–0.2)

## 2019-06-11 MED ORDER — ACETAMINOPHEN 325 MG PO TABS
650.0000 mg | ORAL_TABLET | Freq: Once | ORAL | Status: AC
  Administered 2019-06-11: 650 mg via ORAL
  Filled 2019-06-11: qty 2

## 2019-06-11 MED ORDER — SODIUM CHLORIDE 0.9 % IV BOLUS
500.0000 mL | Freq: Once | INTRAVENOUS | Status: AC
  Administered 2019-06-11: 14:00:00 500 mL via INTRAVENOUS

## 2019-06-11 MED ORDER — FLUTICASONE PROPIONATE 50 MCG/ACT NA SUSP: 2.0000 | Freq: Every day | NASAL | 0 refills | Status: DC

## 2019-06-11 MED ORDER — KETOROLAC TROMETHAMINE 15 MG/ML IJ SOLN
15.0000 mg | Freq: Once | INTRAMUSCULAR | Status: AC
Start: 2019-06-11 — End: 2019-06-11
  Administered 2019-06-11: 15 mg via INTRAVENOUS
  Filled 2019-06-11: qty 1

## 2019-06-11 MED ORDER — OXYMETAZOLINE HCL 0.05 % NA SOLN
1.0000 | Freq: Once | NASAL | Status: AC
  Administered 2019-06-11: 1 via NASAL
  Filled 2019-06-11: qty 30

## 2019-06-11 NOTE — Discharge Instructions (Addendum)
Your headache is likely caused by sinus inflammation.  To improve this you should use Afrin for the next few days.  You can also use Tylenol as needed.  If you have worsening pain, changes in vision, changes in balance, weakness on one side of your body or the other, numbness and tingling, you should be seen right away.  Your blood pressure was elevated, you should decrease the salt from your diet and follow-up with your PCP about this.

## 2019-06-11 NOTE — ED Triage Notes (Signed)
Pt c/o headache x 2 days that worsens with ambulation. Pt states pain over left eye. Pt verbalizes nausea, denies emesis. Pt states mildly sob when ambulating to room. Denies chest pain, denies blurred vision. Pt states she took 2 arthritis tylenol pta at [phone]. Denies photosensitivity

## 2019-06-11 NOTE — ED Provider Notes (Addendum)
[facility] EMERGENCY DEPARTMENT Provider Note   CSN: [id number] Arrival date & time: 06/11/19  1239    History   Chief Complaint Chief Complaint  Patient presents with  . Headache    HPI Christina Greavesudrey M Duarte is a 73 y.o. female.  History provided by patient.  She is also accompanied by her sister who is in the room.  Patient reports headache that started yesterday and has gradually worsened.  She notes that headache worsens with leaning forward and ambulating.  Denies changes in her vision.  Notes associated nausea, photophobia.  Reports that headache is localized to around her left eye.  Also endorses some congestion that started yesterday.  Denies head trauma, loss of consciousness.  She denies any chest pain, shortness of breath, dizziness, fevers.  She states that she has never had a headache like this before.  It is throbbing in nature.  Rates 5/10 pain.  She has taken Tylenol only without improvement.  Notes that she does not have a history of hypertension, "only when I am stressed."  Endorses taking metoprolol for SVT at 1100.   Past Medical History:  Diagnosis Date  . 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 Blood transfusion   . Complication of anesthesia    woke up during colonoscopy  . H/O blood clots    RIGHT KNEE  . Heart palpitations   . PSVT (paroxysmal supraventricular tachycardia) (HCC) 03/06/2018   EKG at the emergency room 03/06/2018: SVT at a rate of 137 bpm,  anteroseptal infarct old.  Marland Kitchen. PVC's (premature ventricular contractions)    HX OF HEART PALPITATIONS AND PVC'S  . PVC's (premature ventricular contractions)    PT STATES IF SHE NEEDS HEART DOCTOR WHILE IN HOSP-SHE WANTS DR. PANG CALLED-HER MEDICAL DOCTOR AND DR. Allyson SabalBERRY    Patient Active Problem List   Diagnosis Date Noted  . Nonspecific abnormal electrocardiogram (ECG) (EKG) 03/13/2019  . PSVT (paroxysmal supraventricular tachycardia) (HCC)  03/06/2018  . Varicose veins of bilateral lower extremities with other complications 01/04/2018  . Status post foot surgery 01/31/2013  . Postop Hypokalemia 08/12/2012  . OA (osteoarthritis) of hip 08/11/2012  . Hyponatremia 09/30/2011  . Osteoarthritis of left knee 09/30/2011    Past Surgical History:  Procedure Laterality Date  . BUNIONECTOMY Right 02/17/11   McBride  . BUNIONECTOMY Right 01/20/13   McBride  . COLONOSCOPY    . ENDOVENOUS ABLATION SAPHENOUS VEIN W/ LASER Right 07/07/2018   endovenous laser ablation R greater saphenous vein and stab phlebectomy > 20 incisions R leg by Fabienne Brunsharles Fields MD   . ENDOVENOUS ABLATION SAPHENOUS VEIN W/ LASER Left 10/06/2018   endovenous laser ablation left greater saphenous vein and stab phlebectomy > 20 incisions left leg by Fabienne Brunsharles Fields MD   . FOOT SURGERY  2012   RIGHT -BUNIONECTOMY  . Hammer toe repair Right 02/17/11  . HYSTEROSCOPY W/D&C N/A 09/13/2013   Procedure: DILATATION AND CURETTAGE /HYSTEROSCOPY;  Surgeon: Miguel AschoffAllan Ross, MD;  Location: [facility];  Service: Gynecology;  Laterality: N/A;  . JOINT REPLACEMENT  2007    RIGHT KNEE  . TONSILLECTOMY  1964  . TOTAL HIP ARTHROPLASTY  08/11/2012   Procedure: TOTAL HIP ARTHROPLASTY;  Surgeon: Loanne DrillingFrank V Aluisio, MD;  Location: [facility];  Service: Orthopedics;  Laterality: Right;  . TOTAL KNEE ARTHROPLASTY  09/29/2011   Procedure: TOTAL KNEE ARTHROPLASTY;  Surgeon: Loanne DrillingFrank V Aluisio;  Location: [facility];  Service: Orthopedics;  Laterality:  Left;  . TUBAL LIGATION  1981     OB History   No obstetric history on file.      Home Medications    Prior to Admission medications   Medication Sig Start Date End Date Taking? Authorizing Provider  acetaminophen (TYLENOL) 80 MG chewable tablet Chew 80 mg by mouth every 6 (six) hours as needed.    [provider]  aspirin 81 MG tablet Take 81 mg by mouth daily.    [provider]  atorvastatin (LIPITOR) 40 MG tablet atorvastatin 40 mg  tablet    [provider]  Cholecalciferol (VITAMIN D-3) 5000 UNITS TABS Take 1 tablet by mouth daily.     [provider]  cyanocobalamin ([employer] VITAMIN B12) 1000 MCG tablet cyanocobalamin (vitamin B-12)    [provider]  metoprolol succinate (TOPROL-XL) 50 MG 24 hr tablet metoprolol succinate ER 50 mg tablet,extended release 24 hr    [provider]  Multiple Vitamins-Minerals (MULTIVITAMIN ADULT PO) multivitamin    [provider]    Family History Family History  Problem Relation Age of Onset  . Heart disease Father   . Colon cancer Neg Hx   . Breast cancer Neg Hx     Social History Social History   Tobacco Use  . Smoking status: Never Smoker  . Smokeless tobacco: Never Used  Substance Use Topics  . Alcohol use: No  . Drug use: No     Allergies   Aspirin and Codeine   Review of Systems Review of Systems  Constitutional: Negative for chills, fatigue and fever.  HENT: Positive for congestion. Negative for sore throat.   Eyes: Positive for photophobia. Negative for visual disturbance.  Respiratory: Negative for cough and shortness of breath.   Cardiovascular: Negative for chest pain.  Gastrointestinal: Positive for nausea. Negative for abdominal pain, diarrhea and vomiting.  Genitourinary: Negative for difficulty urinating.  Musculoskeletal: Negative for back pain and neck pain.  Neurological: Positive for headaches. Negative for dizziness, syncope, weakness and numbness.     Physical Exam Updated Vital Signs BP (!) 170/78   Pulse 69   Temp 99 F (37.2 C) (Oral)   Resp 20   Ht 5\' 10"  (1.778 m)   Wt 89.8 kg   SpO2 98%   BMI 28.41 kg/m   Physical Exam Constitutional:      General: She is not in acute distress.    Appearance: She is well-developed. She is not ill-appearing.  HENT:     Head: Normocephalic and atraumatic.     Comments: No TTP temporal region or TMJ, mild TTP supraorbital notch and left maxillary  sinus Eyes:     General: No visual field deficit or scleral icterus.    Extraocular Movements: Extraocular movements intact.     Pupils: Pupils are equal, round, and reactive to light.  Neck:     Musculoskeletal: Normal range of motion and neck supple.  Cardiovascular:     Rate and Rhythm: Normal rate and regular rhythm.  Pulmonary:     Effort: Pulmonary effort is normal.     Breath sounds: Normal breath sounds.  Abdominal:     General: Bowel sounds are normal. There is no distension.     Palpations: Abdomen is soft.     Tenderness: There is no abdominal tenderness.  Musculoskeletal:     Comments: 5/5 strength BUE/BLE  Skin:    General: Skin is warm and dry.  Neurological:     Mental Status: She is  alert and oriented to person, place, and time. Mental status is at baseline.     GCS: GCS eye subscore is 4. GCS verbal subscore is 5. GCS motor subscore is 6.     Cranial Nerves: No cranial nerve deficit, dysarthria or facial asymmetry.     Sensory: No sensory deficit.     Motor: No weakness.  Psychiatric:        Mood and Affect: Mood normal.        Speech: Speech normal.        Behavior: Behavior normal.      ED Treatments / Results  Labs (all labs ordered are listed, but only abnormal results are displayed) Labs Reviewed  CBC WITH DIFFERENTIAL/PLATELET  BASIC METABOLIC PANEL    EKG None  Radiology No results found.  Procedures Procedures (including critical care time)  Medications Ordered in ED Medications  acetaminophen (TYLENOL) tablet 650 mg (has no administration in time range)  sodium chloride 0.9 % bolus 500 mL (500 mLs Intravenous New Bag/Given 06/11/19 1337)  ketorolac (TORADOL) 15 MG/ML injection 15 mg (15 mg Intravenous Given 06/11/19 1338)     Initial Impression / Assessment and Plan / ED Course  I have reviewed the triage vital signs and the nursing notes.  Pertinent labs & imaging results that were available during my care of the patient were  reviewed by me and considered in my medical decision making (see chart for details).  NETA UPADHYAY is a 73 year old female who presents with gradually worsening left-sided headache.  Patient hypertensive initially to 192/78, on recheck 170/78.  Given improvement, will hold off on PRN blood pressure medication as lack of evidence supporting its benefit at this blood pressure.  Given her age, and that this is a new type of headache, will obtain CT head to rule out acute bleed, edema, mass.  Reassured that she is neurologically intact on exam.  No evidence of temporal arteritis on exam, patient without visual deficits.  Vital signs remained stable.  BMP, CBC within normal limits.  CT head pending.  1445, patient reassessed.  Notes that she had had some improvement in her headache, but feels as though she is having worsening of her headache again.  Rates the pain 4/5 out of 10.  Will recheck blood pressure.  Patient also given Tylenol for pain.  Suspect that given tenderness to palpation of maxillary sinus and supraorbital notch in the setting of congestion, likely sinusitis.  Will give patient afrin as well.  Patient signed out to oncoming provider to follow-up on CT head and patient symptoms as well as blood pressure.  Final Clinical Impressions(s) / ED Diagnoses   Final diagnoses:  None    ED Discharge Orders    None       Cleophas Dunker, DO 06/11/19 1453    Cleophas Dunker, DO 06/11/19 1455    Margette Fast, MD 06/11/19 2029

## 2019-09-14 ENCOUNTER — Other Ambulatory Visit: Payer: Self-pay | Admitting: Internal Medicine

## 2019-09-14 DIAGNOSIS — R599 Enlarged lymph nodes, unspecified: Secondary | ICD-10-CM

## 2019-09-23 ENCOUNTER — Ambulatory Visit (INDEPENDENT_AMBULATORY_CARE_PROVIDER_SITE_OTHER): Payer: Medicare Other | Admitting: Cardiology

## 2019-09-23 ENCOUNTER — Other Ambulatory Visit: Payer: Self-pay

## 2019-09-23 ENCOUNTER — Encounter: Payer: Self-pay | Admitting: Cardiology

## 2019-09-23 VITALS — BP 124/70 | HR 69 | Temp 97.9°F | Ht 70.0 in | Wt 200.0 lb

## 2019-09-23 DIAGNOSIS — I471 Supraventricular tachycardia: Secondary | ICD-10-CM

## 2019-09-23 DIAGNOSIS — I10 Essential (primary) hypertension: Secondary | ICD-10-CM | POA: Diagnosis not present

## 2019-09-23 MED ORDER — VERAPAMIL HCL 80 MG PO TABS: 80.0000 mg | ORAL_TABLET | Freq: Every day | ORAL | 1 refills | Status: DC | PRN

## 2019-09-23 NOTE — Progress Notes (Signed)
Primary Physician/Referring:  Jani Gravel, MD  Patient ID: Christina Duarte, female    DOB: [birth year], 73 y.o.   MRN: [id number]  Chief Complaint  Patient presents with  . Hypertension    6 month f/u  . SVT    HPI: Christina Duarte  is a 73 y.o. female  fairly active African-American female with hyperlipidemia, varicose vein followed by Dr. Kellie Simmering, degenerative joint disease, hyperlipidemia, chronic palpitations, found to have SVT that resolved with Valsalva maneuver on 03/31/2018. She was seen in the emergency room on 06/11/2019 with headache and CT scan was negative for bleed in the head, no acute abnormality, blood pressure was elevated and was recommended to follow-up with the PCP.  Patient continues to have sporadic episodes of heart palpitations that last a few minutes which terminate with Valsalva. She is aware that she has occasional skipped beats and thery are PVC and PAC. Has not had prolonged palpitations like she experienced on the day she went to the emergency room and found to have SVT. Now here to discuss management of PSVT. Otherwise doing well.   States the episodes are short and Self terminated, but she is also aware of doing Valsalva maneuver.  No chest pain or dyspnea.  Past Medical History:  Diagnosis Date  . 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 Blood transfusion   . Complication of anesthesia    woke up during colonoscopy  . H/O blood clots    RIGHT KNEE  . Heart palpitations   . PSVT (paroxysmal supraventricular tachycardia) ([place]) 03/06/2018   EKG at the emergency room 03/06/2018: SVT at a rate of 137 bpm,  anteroseptal infarct old.  Marland Kitchen PVC's (premature ventricular contractions)    HX OF HEART PALPITATIONS AND PVC'S  . PVC's (premature ventricular contractions)    PT STATES IF SHE NEEDS HEART DOCTOR WHILE IN HOSP-SHE WANTS DR. PANG [place]    Past Surgical History:  Procedure  Laterality Date  . BUNIONECTOMY Right 02/17/11   [facility]  . BUNIONECTOMY Right 01/20/13   [facility]Bride  . COLONOSCOPY    . ENDOVENOUS ABLATION SAPHENOUS VEIN W/ LASER Right 07/07/2018   endovenous laser ablation R greater saphenous vein and stab phlebectomy > 20 incisions R leg by Ruta Hinds MD   . ENDOVENOUS ABLATION SAPHENOUS VEIN W/ LASER Left 10/06/2018   endovenous laser ablation left greater saphenous vein and stab phlebectomy > 20 incisions left leg by Ruta Hinds MD   . FOOT SURGERY  2012   RIGHT -BUNIONECTOMY  . Hammer toe repair Right 02/17/11  . HYSTEROSCOPY W/D&C N/A 09/13/2013   Procedure: DILATATION AND CURETTAGE /HYSTEROSCOPY;  Surgeon: Gus Height, MD;  Location: [facility];  Service: Gynecology;  Laterality: N/A;  . JOINT REPLACEMENT  2007    RIGHT KNEE  . TONSILLECTOMY  1964  . TOTAL HIP ARTHROPLASTY  08/11/2012   Procedure: TOTAL HIP ARTHROPLASTY;  Surgeon: Gearlean Alf, MD;  Location: [facility];  Service: Orthopedics;  Laterality: Right;  . TOTAL KNEE ARTHROPLASTY  09/29/2011   Procedure: TOTAL KNEE ARTHROPLASTY;  Surgeon: Gearlean Alf;  Location: [facility];  Service: Orthopedics;  Laterality: Left;  . TUBAL LIGATION  1981    Social History   Socioeconomic History  . Marital status: Divorced    Spouse name: Not on file  . Number of children: 3  . Years of education: Not on file  . Highest education  level: Not on file  Occupational History  . Not on file  Social Needs  . Financial resource strain: Not on file  . Food insecurity    Worry: Not on file    Inability: Not on file  . Transportation needs    Medical: Not on file    Non-medical: Not on file  Tobacco Use  . Smoking status: Never Smoker  . Smokeless tobacco: Never Used  Substance and Sexual Activity  . Alcohol use: No  . Drug use: No  . Sexual activity: Not on file  Lifestyle  . Physical activity    Days per week: Not on file    Minutes per session: Not on file  . Stress: Not on file   Relationships  . Social Herbalist on phone: Not on file    Gets together: Not on file    Attends religious service: Not on file    Active member of club or organization: Not on file    Attends meetings of clubs or organizations: Not on file    Relationship status: Not on file  . Intimate partner violence    Fear of current or ex partner: Not on file    Emotionally abused: Not on file    Physically abused: Not on file    Forced sexual activity: Not on file  Other Topics Concern  . Not on file  Social History Narrative  . Not on file    Review of Systems  Constitution: Negative for chills, decreased appetite, malaise/fatigue and weight gain.  Cardiovascular: Positive for leg swelling (occasional) and palpitations. Negative for dyspnea on exertion and syncope.  Endocrine: Negative for cold intolerance.  Hematologic/Lymphatic: Does not bruise/bleed easily.  Musculoskeletal: Negative for joint swelling.  Gastrointestinal: Negative for abdominal pain, anorexia and change in bowel habit.  Neurological: Negative for headaches and light-headedness.  Psychiatric/Behavioral: Negative for depression and substance abuse.  All other systems reviewed and are negative.     Objective  Blood pressure 124/70, pulse 69, temperature 97.9 F (36.6 C), height 5' 10"  (1.778 m), weight 200 lb (90.7 kg), SpO2 96 %. Body mass index is 28.7 kg/m.  Physical Exam  Constitutional: She appears well-developed and well-nourished. No distress.  HENT:  Head: Atraumatic.  Eyes: Conjunctivae are normal.  Neck: Neck supple. No JVD present. No thyromegaly present.  Cardiovascular: Normal rate, regular rhythm, normal heart sounds, intact distal pulses and normal pulses. Exam reveals no gallop.  No murmur heard. Pulses:      Carotid pulses are on the right side with bruit and on the left side with bruit. Pulmonary/Chest: Effort normal and breath sounds normal.  Abdominal: Soft. Bowel sounds are  normal.  Musculoskeletal: Normal range of motion.  Neurological: She is alert.  Skin: Skin is warm and dry.  Psychiatric: She has a normal mood and affect.   Radiology: No results found.  Laboratory examination:   Labs 04/29/2018: WBC 3.5, CBC otherwise normal.  Potassium 4.1, creatinine 0.57, EGFR 93/107, AST 53, ALT 49, CMP otherwise normal.  CMP Latest Ref Rng & Units 06/11/2019 03/06/2018 01/31/2018  Glucose 70 - 99 mg/dL 94 114(H) 126(H)  BUN 8 - 23 mg/dL 15 17 28(H)  Creatinine 0.44 - 1.00 mg/dL 0.69 0.73 0.79  Sodium 135 - 145 mmol/L 139 137 139  Potassium 3.5 - 5.1 mmol/L 3.8 3.1(L) 3.9  Chloride 98 - 111 mmol/L 105 106 104  CO2 22 - 32 mmol/L 26 20(L) 23  Calcium 8.9 -  10.3 mg/dL 9.6 9.7 9.7  Total Protein 6.5 - 8.1 g/dL - 7.8 8.3(H)  Total Bilirubin 0.3 - 1.2 mg/dL - 1.0 1.6(H)  Alkaline Phos 38 - 126 U/L - 75 85  AST 15 - 41 U/L - 49(H) 45(H)  ALT 14 - 54 U/L - 47 44   CBC Latest Ref Rng & Units 06/11/2019 03/06/2018 01/31/2018  WBC 4.0 - 10.5 K/uL 5.4 5.0 6.8  Hemoglobin 12.0 - 15.0 g/dL 14.1 16.0(H) 15.6(H)  Hematocrit 36.0 - 46.0 % 44.7 46.1(H) 46.5(H)  Platelets 150 - 400 K/uL 247 270 274   Lipid Panel  No results found for: CHOL, TRIG, HDL, CHOLHDL, VLDL, LDLCALC, LDLDIRECT HEMOGLOBIN A1C No results found for: HGBA1C, MPG TSH No results for input(s): TSH in the last 8760 hours.  PRN Meds:. There are no discontinued medications. Current Meds  Medication Sig  . acetaminophen (TYLENOL) 80 MG chewable tablet Chew 80 mg by mouth every 6 (six) hours as needed.  Marland Kitchen amLODipine (NORVASC) 2.5 MG tablet Take 2.5 mg by mouth daily.  Marland Kitchen aspirin 81 MG tablet Take 81 mg by mouth daily.  Marland Kitchen atorvastatin (LIPITOR) 40 MG tablet atorvastatin 40 mg tablet  . Cholecalciferol (VITAMIN D-3) 5000 UNITS TABS Take 1 tablet by mouth daily.   . cyanocobalamin ([employer] VITAMIN B12) 1000 MCG tablet cyanocobalamin (vitamin B-12)  . metoprolol succinate (TOPROL-XL) 50 MG 24 hr tablet metoprolol  succinate ER 50 mg tablet,extended release 24 hr  . Multiple Vitamins-Minerals (MULTIVITAMIN ADULT PO) multivitamin    Cardiac Studies:   Lexiscan sestamibi stress test 08/23/2018: 1. The resting electrocardiogram demonstrated normal sinus rhythm, prolonged QT, no resting arrhythmias and normal rest repolarization.  Stress EKG is non-diagnostic for ischemia as it a pharmacologic stress using Lexiscan. The patient developed significant symptoms which included Headache.  2. Myocardial perfusion imaging is normal. Overall left ventricular systolic function was normal without regional wall motion abnormalities. The left ventricular ejection fraction was 65%.  This is a low risk study.  Carotid artery duplex 08/05/2018: No hemodynamically significant arterial disease in the internal carotid artery bilaterally. Mild soft plaque noted bilaterally. Antegrade right vertebral artery flow. Antegrade left vertebral artery flow.  2 week event monitor 06/30/2018-07/13/2018: Dominant rhythm Normal sinus. 7 patient triggered events occured without reported symptoms that correlated with NSR with occasional PACs. Brief Episodes of ectopic atrial rhythm (day 1 at 11:43am and day 2 at 1243am). No A fib or SVT occurred.   Assessment   PSVT (paroxysmal supraventricular tachycardia) (HCC) - Plan: EKG 12-Lead, verapamil (CALAN) 80 MG tablet  Essential hypertension  EKG at the emergency room 03/06/2018: SVT at a rate of 137 bpm,  anteroseptal infarct old.  EKG 09/23/2019: Normal sinus rhythm with rate of 60 bpm, right atrial enlargement, normal axis, poor R wave progression, cannot exclude anteroseptal infarct old.  No evidence of ischemia. No significant change from  EKG 06/30/2018.  Recommendations:   Christina Duarte  is a 73 y.o.  female  fairly active African-American female with hyperlipidemia, varicose vein followed by Dr. Kellie Simmering, degenerative joint disease, hyperlipidemia, chronic palpitations, found to  have SVT that resolved with Valsalva maneuver on 03/31/2018. She was seen in the emergency room on 06/11/2019 with headache and CT scan was negative for bleed in the head, no acute abnormality, blood pressure was elevated and was recommended to follow-up with the PCP.  Patient continues to have sporadic episodes of heart palpitations that last a few minutes which terminate with Valsalva. She has PRN Verapamil  to take.    I advised her to keep an eye and monitor her symptoms and if she continues to have frequent and prolonged episodes of SVT, then we can consider ablation.     Since being on amlodipine, blood pressure is well controlled.  No changes were done today with the medications, I'll see her back in one year.  I have refilled her verapamil that she takes on a p.r.n. basis.  Adrian Prows, MD, [facility] 09/23/2019, 11:41 AM [facility]. [place] Pager: [phone] Office: [phone] If no answer Cell [phone]

## 2019-10-15 ENCOUNTER — Other Ambulatory Visit: Payer: Self-pay | Admitting: Cardiology

## 2019-10-15 DIAGNOSIS — I471 Supraventricular tachycardia: Secondary | ICD-10-CM

## 2019-10-20 ENCOUNTER — Ambulatory Visit
Admission: RE | Admit: 2019-10-20 | Discharge: 2019-10-20 | Disposition: A | Discharge status: Home / Self Care | Payer: Medicare Other | Source: Ambulatory Visit | Attending: Internal Medicine | Admitting: Internal Medicine

## 2019-10-20 ENCOUNTER — Other Ambulatory Visit: Payer: Self-pay

## 2019-10-20 ENCOUNTER — Ambulatory Visit: Payer: Medicare Other

## 2019-10-20 DIAGNOSIS — R599 Enlarged lymph nodes, unspecified: Secondary | ICD-10-CM

## 2019-11-02 ENCOUNTER — Ambulatory Visit
Admission: RE | Admit: 2019-11-02 | Discharge: 2019-11-02 | Disposition: A | Discharge status: Home / Self Care | Payer: Medicare Other | Source: Ambulatory Visit | Attending: Internal Medicine | Admitting: Internal Medicine

## 2019-11-02 ENCOUNTER — Other Ambulatory Visit: Payer: Self-pay

## 2019-11-02 ENCOUNTER — Other Ambulatory Visit: Payer: Self-pay | Admitting: Internal Medicine

## 2019-11-02 DIAGNOSIS — R599 Enlarged lymph nodes, unspecified: Secondary | ICD-10-CM

## 2019-12-10 IMAGING — CR DG CHEST 2V
2 series · 2 of 2 positions shown · non-contrast
Comparison: Chest x-ray 12/25/2016.

CLINICAL DATA: 71-year-old female with history of heart
palpitations and pre syncopal episodes.

EXAM:
CHEST - 2 VIEW

[w chest pa]
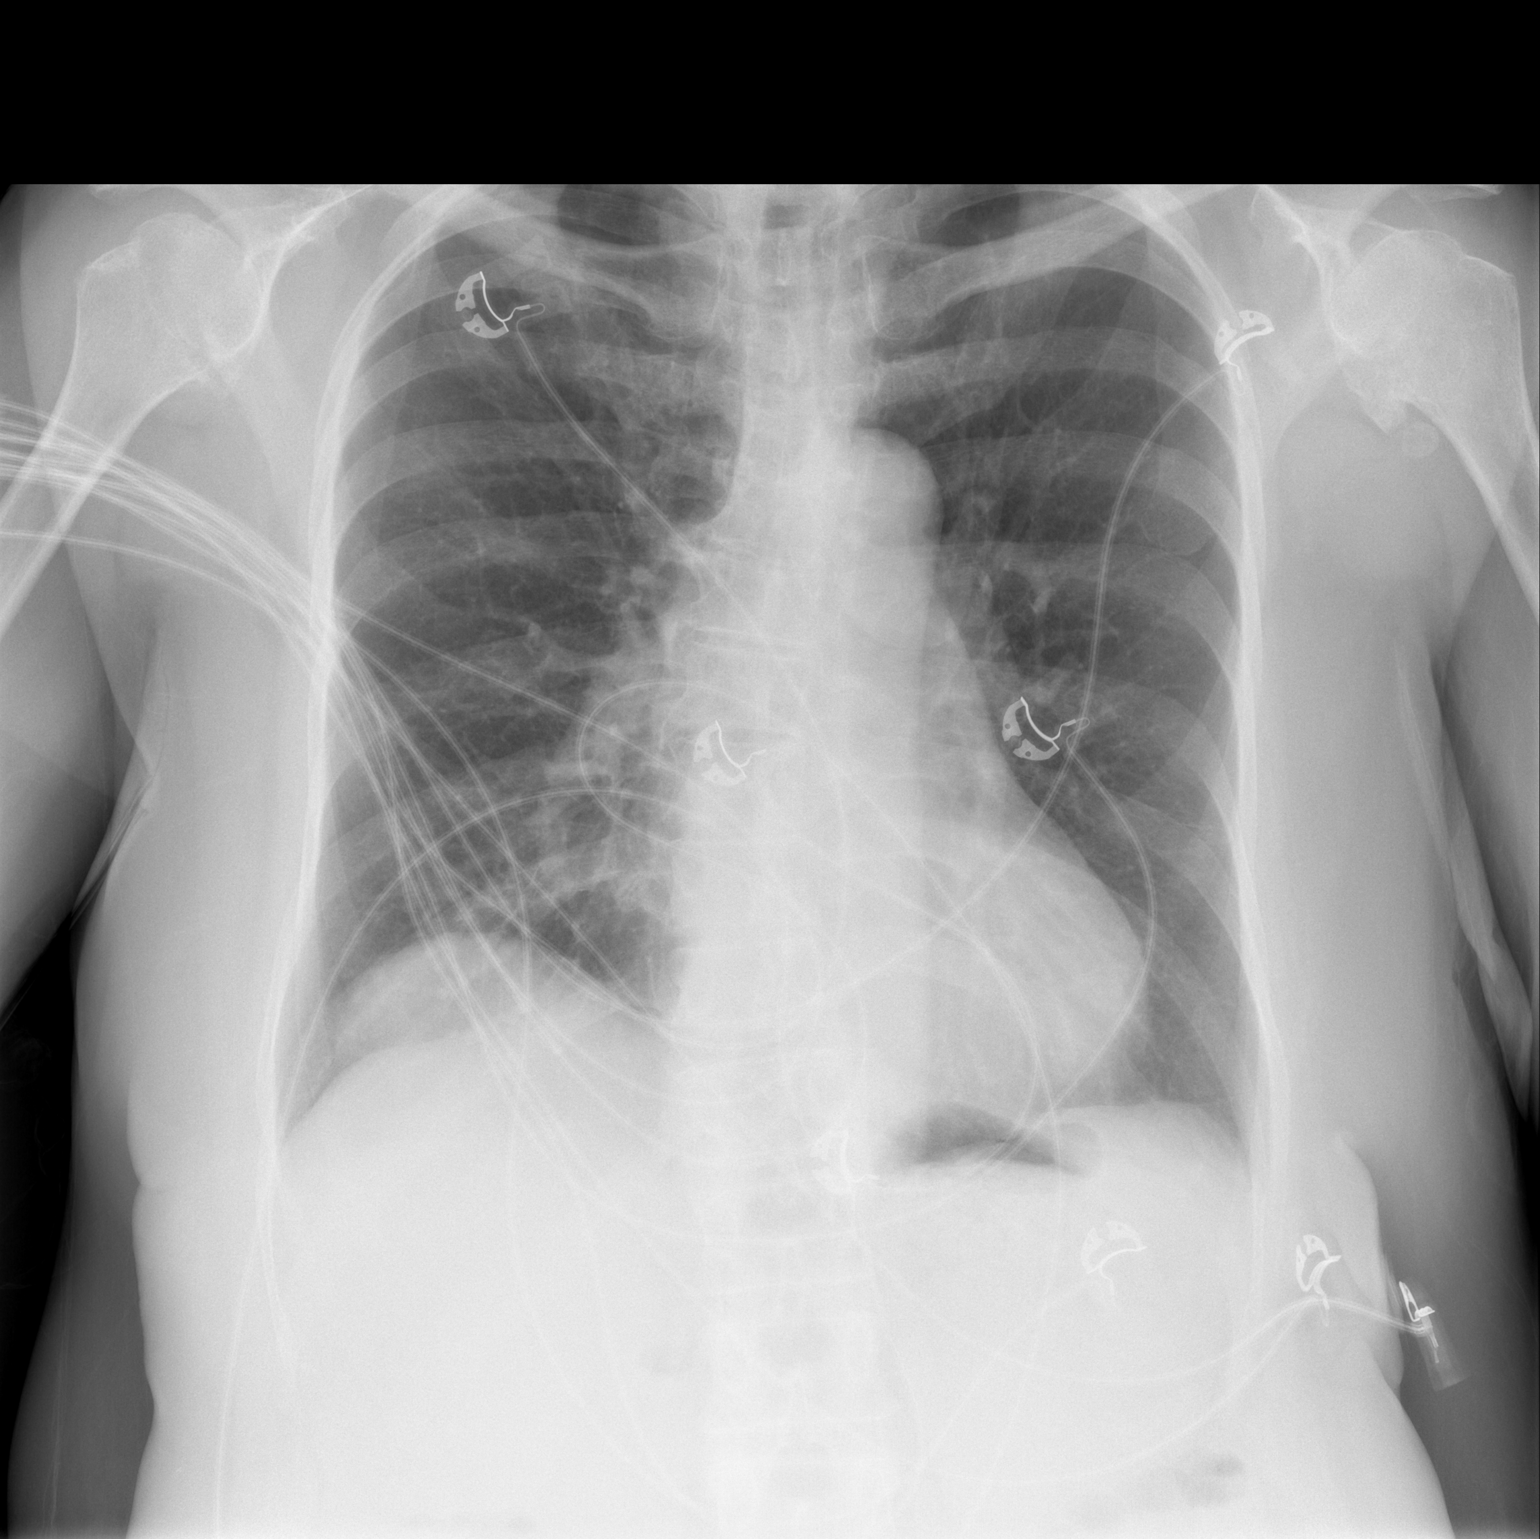

[w chest lat]
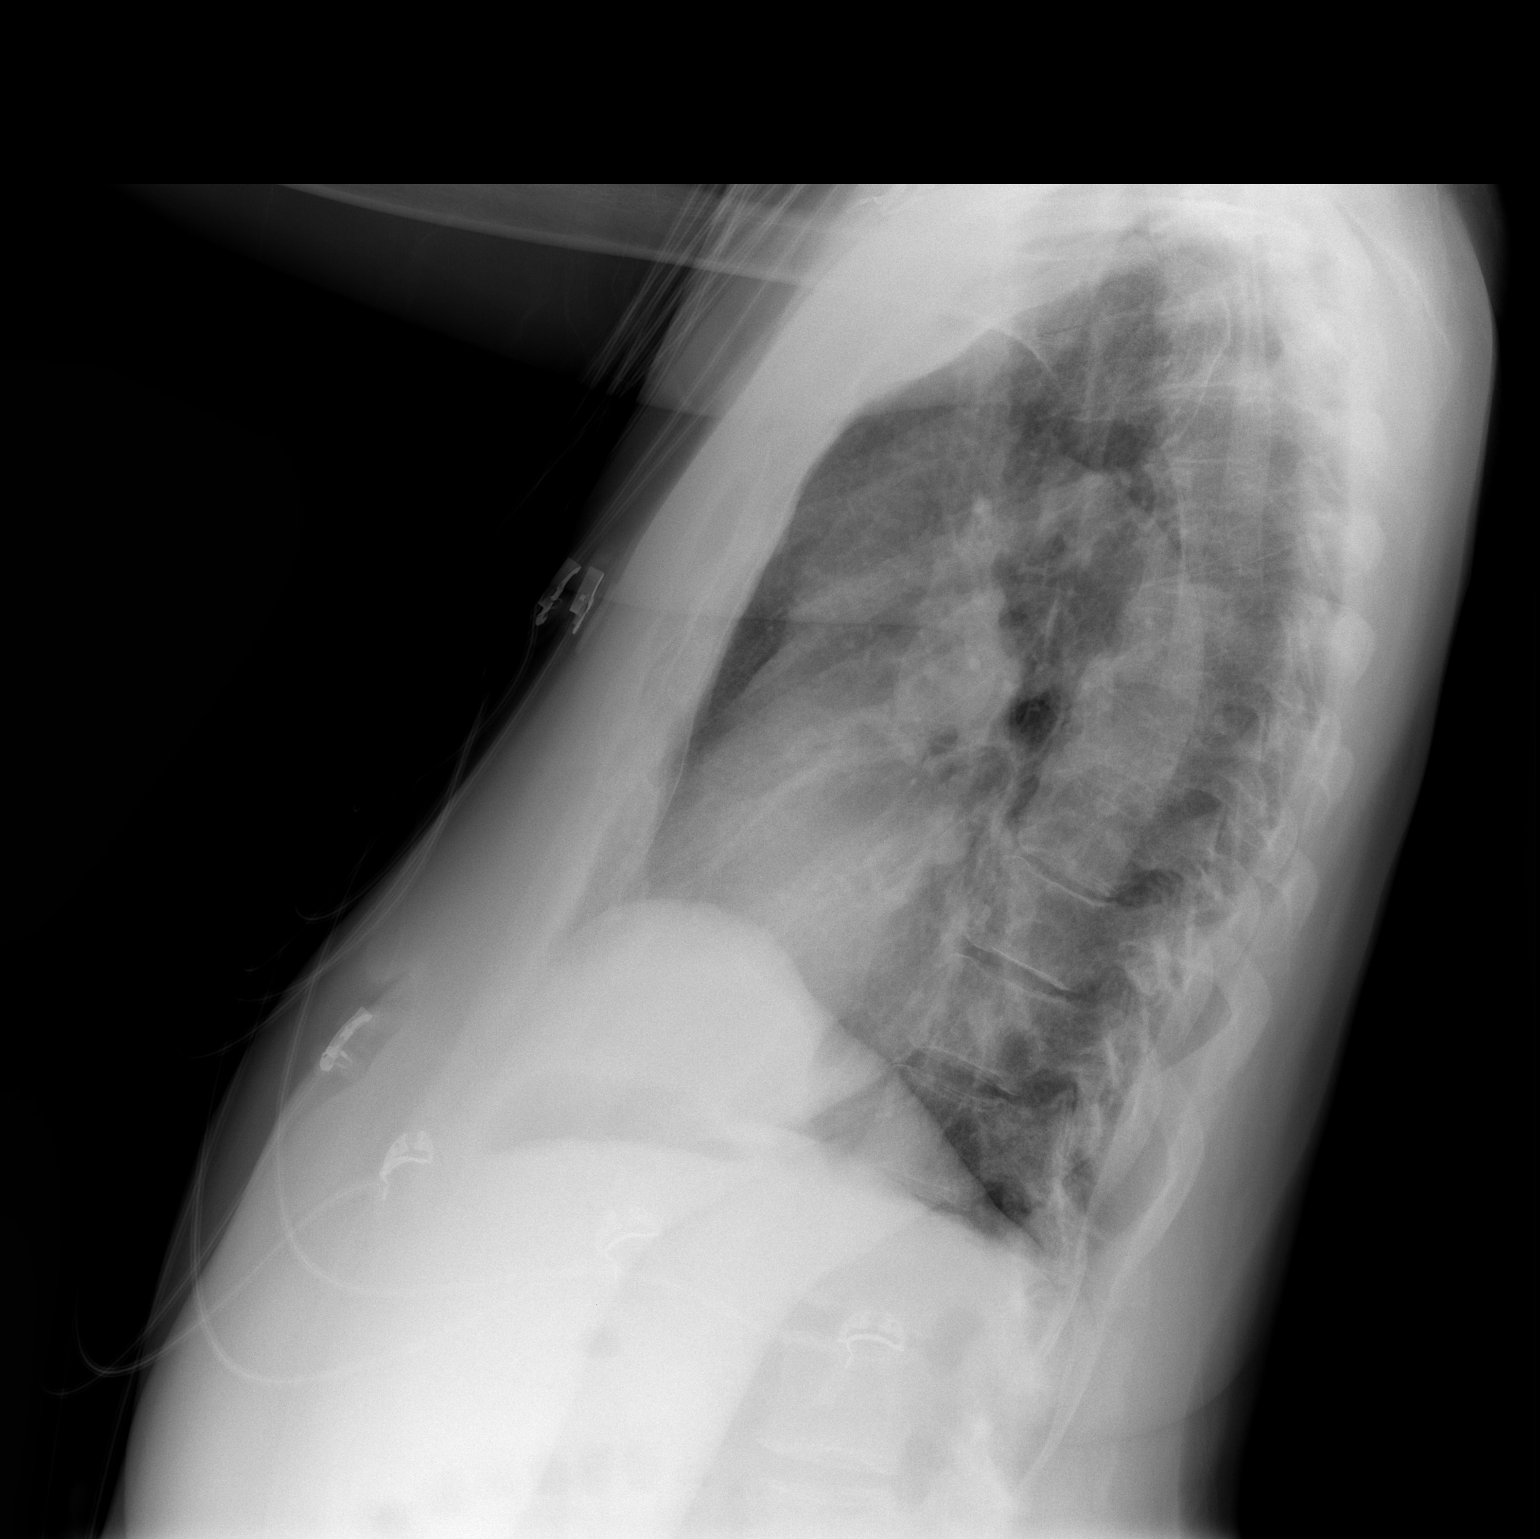

[2 of 2 positions shown; findings below may reference images not displayed]

FINDINGS: Lung volumes are normal. Eventration of the right hemidiaphragm,
similar to the prior study. No consolidative airspace disease. No
pleural effusions. No pneumothorax. No pulmonary nodule or mass
noted. Pulmonary vasculature and the cardiomediastinal silhouette
are within normal limits.
IMPRESSION: No radiographic evidence of acute cardiopulmonary disease.

## 2020-09-03 ENCOUNTER — Other Ambulatory Visit: Payer: Self-pay | Admitting: Internal Medicine

## 2020-09-03 DIAGNOSIS — Z1231 Encounter for screening mammogram for malignant neoplasm of breast: Secondary | ICD-10-CM

## 2020-09-24 ENCOUNTER — Ambulatory Visit: Payer: Medicare Other | Admitting: Cardiology

## 2020-09-24 ENCOUNTER — Encounter: Payer: Self-pay | Admitting: Cardiology

## 2020-09-24 ENCOUNTER — Other Ambulatory Visit: Payer: Self-pay

## 2020-09-24 VITALS — BP 130/84 | HR 67 | Resp 16 | Ht 70.0 in | Wt 214.4 lb

## 2020-09-24 DIAGNOSIS — I10 Essential (primary) hypertension: Secondary | ICD-10-CM

## 2020-09-24 DIAGNOSIS — I471 Supraventricular tachycardia, unspecified: Secondary | ICD-10-CM

## 2020-09-24 DIAGNOSIS — R9431 Abnormal electrocardiogram [ECG] [EKG]: Secondary | ICD-10-CM

## 2020-09-24 NOTE — Progress Notes (Signed)
Primary Physician/Referring:  Jani Gravel, MD  Patient ID: Christina Duarte, female    DOB: June 10, 1946, 74 y.o.   MRN: [id number]  Chief Complaint  Patient presents with  . SVT  . Hypertension    1 year F/U    HPI: Christina Duarte  is a 74 y.o.fairly active African-American female with hyperlipidemia, varicose vein followed by Dr. Kellie Simmering, degenerative joint disease, hyperlipidemia, chronic palpitations, found to have SVT that resolved with Valsalva maneuver on 03/31/2018.   Patient continues to have sporadic episodes of heart palpitations that last a few minutes which terminate with Valsalva. She is aware that she has occasional skipped beats and thery are PVC and PAC. Has not had prolonged palpitations like she experienced on the day she went to the emergency room and found to have SVT.  No chest pain or dyspnea.  Past Medical History:  Diagnosis Date  . 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 Blood transfusion   . Complication of anesthesia    woke up during colonoscopy  . H/O blood clots    RIGHT KNEE  . Heart palpitations   . PSVT (paroxysmal supraventricular tachycardia) ([place]) 03/06/2018   EKG at the emergency room 03/06/2018: SVT at a rate of 137 bpm,  anteroseptal infarct old.  Marland Kitchen PVC's (premature ventricular contractions)   . PVC's (premature ventricular contractions)     Past Surgical History:  Procedure Laterality Date  . BUNIONECTOMY Right 02/17/11   [facility]  . BUNIONECTOMY Right 01/20/13   [facility]ride  . COLONOSCOPY    . ENDOVENOUS ABLATION SAPHENOUS VEIN W/ LASER Right 07/07/2018   endovenous laser ablation R greater saphenous vein and stab phlebectomy > 20 incisions R leg by Ruta Hinds MD   . ENDOVENOUS ABLATION SAPHENOUS VEIN W/ LASER Left 10/06/2018   endovenous laser ablation left greater saphenous vein and stab phlebectomy > 20 incisions left leg by Ruta Hinds MD   . FOOT SURGERY  2012   RIGHT -BUNIONECTOMY    . Hammer toe repair Right 02/17/11  . HYSTEROSCOPY WITH D & C N/A 09/13/2013   Procedure: DILATATION AND CURETTAGE /HYSTEROSCOPY;  Surgeon: Gus Height, MD;  Location: [facility];  Service: Gynecology;  Laterality: N/A;  . JOINT REPLACEMENT  2007    RIGHT KNEE  . TONSILLECTOMY  1964  . TOTAL HIP ARTHROPLASTY  08/11/2012   Procedure: TOTAL HIP ARTHROPLASTY;  Surgeon: Gearlean Alf, MD;  Location: [facility];  Service: Orthopedics;  Laterality: Right;  . TOTAL KNEE ARTHROPLASTY  09/29/2011   Procedure: TOTAL KNEE ARTHROPLASTY;  Surgeon: Gearlean Alf;  Location: [facility];  Service: Orthopedics;  Laterality: Left;  . TUBAL LIGATION  1981   Social History   Tobacco Use  . Smoking status: Never Smoker  . Smokeless tobacco: Never Used  Substance Use Topics  . Alcohol use: No   Marital Status: Divorced   Review of Systems  Constitutional: Negative for chills, decreased appetite, malaise/fatigue and weight gain.  Cardiovascular: Positive for leg swelling (occasional) and palpitations. Negative for dyspnea on exertion and syncope.  Endocrine: Negative for cold intolerance.  Hematologic/Lymphatic: Does not bruise/bleed easily.  Musculoskeletal: Negative for joint swelling.  Gastrointestinal: Negative for abdominal pain, anorexia and change in bowel habit.  Neurological: Negative for headaches and light-headedness.  Psychiatric/Behavioral: Negative for depression and substance abuse.  All other systems reviewed and are negative.     Objective  Blood pressure 130/84, pulse 67, resp. rate  16, height 5' 10"  (1.778 m), weight 214 lb 6.4 oz (97.3 kg), SpO2 94 %. Body mass index is 30.76 kg/m.  Vitals with BMI 09/24/2020 09/23/2019 06/11/2019  Height 5' 10"  5' 10"  -  Weight 214 lbs 6 oz 200 lbs -  BMI 16.38 45.3 -  Systolic 646 803 212  Diastolic 84 70 70  Pulse 67 69 58     Physical Exam Constitutional:      General: She is not in acute distress.    Appearance: She is well-developed.  HENT:      Head: Atraumatic.  Eyes:     Conjunctiva/sclera: Conjunctivae normal.  Neck:     Thyroid: No thyromegaly.     Vascular: No JVD.  Cardiovascular:     Rate and Rhythm: Normal rate and regular rhythm.     Pulses: Normal pulses and intact distal pulses.     Heart sounds: Normal heart sounds. No murmur heard.  No gallop.   Pulmonary:     Effort: Pulmonary effort is normal.     Breath sounds: Normal breath sounds.  Abdominal:     General: Bowel sounds are normal.     Palpations: Abdomen is soft.  Musculoskeletal:        General: Normal range of motion.     Cervical back: Neck supple.  Skin:    General: Skin is warm and dry.  Neurological:     Mental Status: She is alert.    Radiology: No results found.  Laboratory examination:   External labs:  Cholesterol, total 182.000 m 04/29/2018 HDL 81.000 mg 06/13/2020 LDL-C 124.000 ca 06/13/2020 Triglycerides 46.000 mg 06/13/2020  Hemoglobin 14.100 g/d 06/11/2019 Platelets 247.000 K/ 06/11/2019   Creatinine, Serum 0.690 mg/ 06/11/2019 Potassium 3.800 mm 06/11/2019 Magnesium N/D ALT (SGPT) 49.000 IU/ 04/29/2018  Labs 04/29/2018: WBC 3.5, CBC otherwise normal.  Potassium 4.1, creatinine 0.57, EGFR 93/107, AST 53, ALT 49, CMP otherwise normal.  Current Outpatient Medications on File Prior to Visit  Medication Sig Dispense Refill  . acetaminophen (TYLENOL) 80 MG chewable tablet Chew 80 mg by mouth every 6 (six) hours as needed.    Marland Kitchen amLODipine (NORVASC) 2.5 MG tablet Take 2.5 mg by mouth daily.    Marland Kitchen aspirin 81 MG tablet Take 81 mg by mouth daily.    Marland Kitchen atorvastatin (LIPITOR) 40 MG tablet atorvastatin 40 mg tablet    . Cholecalciferol (VITAMIN D-3) 5000 UNITS TABS Take 1 tablet by mouth daily.     . cyanocobalamin ([employer]S VITAMIN B12) 1000 MCG tablet cyanocobalamin (vitamin B-12)    . metoprolol succinate (TOPROL-XL) 50 MG 24 hr tablet metoprolol succinate ER 50 mg tablet,extended release 24 hr    . Multiple Vitamins-Minerals (MULTIVITAMIN  ADULT PO) multivitamin    . verapamil (CALAN) 80 MG tablet TAKE 1 TABLET (80 MG TOTAL) BY MOUTH DAILY AS NEEDED (RAPID HEART BEAT (SVT)). 90 tablet 3  . fluticasone (FLONASE) 50 MCG/ACT nasal spray Place 2 sprays into both nostrils daily for 10 days. 1 g 0   No current facility-administered medications on file prior to visit.     Cardiac Studies:   Lexiscan sestamibi stress test 08/23/2018: 1. The resting electrocardiogram demonstrated normal sinus rhythm, prolonged QT, no resting arrhythmias and normal rest repolarization.  Stress EKG is non-diagnostic for ischemia as it a pharmacologic stress using Lexiscan. The patient developed significant symptoms which included Headache.  2. Myocardial perfusion imaging is normal. Overall left ventricular systolic function was normal without regional wall motion abnormalities. The left  ventricular ejection fraction was 65%.  This is a low risk study.  Carotid artery duplex 08/05/2018: No hemodynamically significant arterial disease in the internal carotid artery bilaterally. Mild soft plaque noted bilaterally. Antegrade right vertebral artery flow. Antegrade left vertebral artery flow.  2 week event monitor 06/30/2018-07/13/2018: Dominant rhythm Normal sinus. 7 patient triggered events occured without reported symptoms that correlated with NSR with occasional PACs. Brief Episodes of ectopic atrial rhythm (day 1 at 11:43am and day 2 at 1243am). No A fib or SVT occurred.  EKG:  EKG 09/24/2020: Normal sinus rhythm at rate of 61 bpm, left atrial enlargement, normal axis.  Poor R wave progression, probably normal variant.  No evidence of ischemia, otherwise normal EKG.    Assessment   PSVT (paroxysmal supraventricular tachycardia) (HCC) - Plan: EKG 12-Lead  EKG at the emergency room 03/06/2018: SVT at a rate of 137 bpm,  anteroseptal infarct old.  No orders of the defined types were placed in this encounter. There are no discontinued medications.     EKG 09/23/2019: Normal sinus rhythm with rate of 60 bpm, right atrial enlargement, normal axis, poor R wave progression, cannot exclude anteroseptal infarct old.  No evidence of ischemia. No significant change from  EKG 06/30/2018.  Recommendations:   JULIAHNA WISWELL  is a 74 y.o.  African-American female with hyperlipidemia, varicose vein followed by Dr. Kellie Simmering, degenerative joint disease, hyperlipidemia, chronic palpitations, found to have SVT that resolved with Valsalva maneuver on 03/31/2018.   She is presently doing well, has not had any significant PSVT episodes and has occasional palpitations suggestive of PACs and PVCs.  Blood pressure is also well controlled.  I reviewed her external labs, although LDL is slightly elevated, she is presently 74 years of age and has no significant vascular disease that we know of, hence certainly we could leave it to her to decide whether she wants to be on a statin or not.  She is stable from cardiac standpoint and has over the past 2 to 3 years she has not had any significant issues I will see her back on a as needed basis.    Adrian Prows, MD, [facility] 09/24/2020, 11:35 AM Office: [phone] Pager: [phone]

## 2020-11-06 ENCOUNTER — Other Ambulatory Visit: Payer: Self-pay

## 2020-11-06 ENCOUNTER — Ambulatory Visit
Admission: RE | Admit: 2020-11-06 | Discharge: 2020-11-06 | Disposition: A | Discharge status: Home / Self Care | Payer: Medicare Other | Source: Ambulatory Visit | Attending: Internal Medicine | Admitting: Internal Medicine

## 2020-11-06 DIAGNOSIS — Z1231 Encounter for screening mammogram for malignant neoplasm of breast: Secondary | ICD-10-CM

## 2021-04-02 ENCOUNTER — Encounter: Payer: Self-pay | Admitting: Orthopedic Surgery

## 2021-04-02 ENCOUNTER — Other Ambulatory Visit: Payer: Self-pay

## 2021-04-02 ENCOUNTER — Ambulatory Visit (INDEPENDENT_AMBULATORY_CARE_PROVIDER_SITE_OTHER): Payer: Medicare Other | Admitting: Physician Assistant

## 2021-04-02 VITALS — Wt 214.4 lb

## 2021-04-02 DIAGNOSIS — B351 Tinea unguium: Secondary | ICD-10-CM

## 2021-04-02 NOTE — Progress Notes (Signed)
Office Visit Note   Patient: Christina Duarte           Date of Birth: Jul 06, 1946           MRN: [id number] Visit Date: 04/02/2021              Requested by: [name], DO [address] [place],  [place] [address] PCP: [name], DO  No chief complaint on file.     HPI: Patient is a pleasant 75 year old woman who periodically gets her nails trimmed.  Her previous provider retired and she is asking if she can have her nails trimmed.  She is unable to safely do this herself  Assessment & Plan: Visit Diagnoses: No diagnosis found.  Plan: Follow-up in 3 months for repeat nail trimming  Follow-Up Instructions: No follow-ups on file.   Ortho Exam  Patient is alert, oriented, no adenopathy, well-dressed, normal affect, normal respiratory effort. Feet are overall in good condition.  She has palpable pulses.  She has onychomycotic nails x10.  These were trimmed to a stable surface.  No evidence of any infection or ulceration  Imaging: No results found. No images are attached to the encounter.  Labs: Lab Results  Component Value Date   REPTSTATUS 02/01/2018 FINAL 01/31/2018   CULT  01/31/2018    NO GROWTH Performed at [facility], [address] [place], [place] [address]    Leanor Kail PNEUMONIAE 10/02/2012     Lab Results  Component Value Date   ALBUMIN 4.5 03/06/2018   ALBUMIN 4.7 01/31/2018   ALBUMIN 3.9 08/05/2012    Lab Results  Component Value Date   MG 1.6 (L) 03/06/2018   No results found for: VD25OH  No results found for: PREALBUMIN CBC EXTENDED Latest Ref Rng & Units 06/11/2019 03/06/2018 01/31/2018  WBC 4.0 - 10.5 K/uL 5.4 5.0 6.8  RBC 3.87 - 5.11 MIL/uL 5.09 5.58(H) 5.50(H)  HGB 12.0 - 15.0 g/dL 92.4 16.0(H) 15.6(H)  HCT 36.0 - 46.0 % 44.7 46.1(H) 46.5(H)  PLT 150 - 400 K/uL 247 270 274  NEUTROABS 1.7 - 7.7 K/uL 3.7 3.4 6.0  LYMPHSABS 0.7 - 4.0 K/uL 1.1 0.7 0.4(L)     Body mass index is 30.76 kg/m.  Orders:   No orders of the defined types were placed in this encounter.  No orders of the defined types were placed in this encounter.    Procedures: No procedures performed  Clinical Data: No additional findings.  ROS:  All other systems negative, except as noted in the HPI. Review of Systems  Objective: Vital Signs: Wt 214 lb 6.4 oz (97.3 kg)   BMI 30.76 kg/m   Specialty Comments:  No specialty comments available.  PMFS History: Patient Active Problem List   Diagnosis Date Noted  . Nonspecific abnormal electrocardiogram (ECG) (EKG) 03/13/2019  . PSVT (paroxysmal supraventricular tachycardia) (HCC) 03/06/2018  . Varicose veins of bilateral lower extremities with other complications 01/04/2018  . Status post foot surgery 01/31/2013  . Postop Hypokalemia 08/12/2012  . OA (osteoarthritis) of hip 08/11/2012  . Hyponatremia 09/30/2011  . Osteoarthritis of left knee 09/30/2011   Past Medical History:  Diagnosis Date  . 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 Blood transfusion   . Complication of anesthesia    woke up during colonoscopy  . H/O blood clots    RIGHT KNEE  . Heart palpitations   . PSVT (paroxysmal supraventricular tachycardia) (  HCC) 03/06/2018   EKG at the emergency room 03/06/2018: SVT at a rate of 137 bpm,  anteroseptal infarct old.  Marland Kitchen PVC's (premature ventricular contractions)   . PVC's (premature ventricular contractions)     Family History  Problem Relation Age of Onset  . Heart disease Father   . Colon cancer Neg Hx   . Breast cancer Neg Hx     Past Surgical History:  Procedure Laterality Date  . BUNIONECTOMY Right 02/17/11   McBride  . BUNIONECTOMY Right 01/20/13   McBride  . COLONOSCOPY    . ENDOVENOUS ABLATION SAPHENOUS VEIN W/ LASER Right 07/07/2018   endovenous laser ablation R greater saphenous vein and stab phlebectomy > 20 incisions R leg by Fabienne Bruns MD   . ENDOVENOUS ABLATION  SAPHENOUS VEIN W/ LASER Left 10/06/2018   endovenous laser ablation left greater saphenous vein and stab phlebectomy > 20 incisions left leg by Fabienne Bruns MD   . FOOT SURGERY  2012   RIGHT -BUNIONECTOMY  . Hammer toe repair Right 02/17/11  . HYSTEROSCOPY WITH D & C N/A 09/13/2013   Procedure: DILATATION AND CURETTAGE /HYSTEROSCOPY;  Surgeon: Miguel Aschoff, MD;  Location: [facility];  Service: Gynecology;  Laterality: N/A;  . JOINT REPLACEMENT  2007    RIGHT KNEE  . TONSILLECTOMY  1964  . TOTAL HIP ARTHROPLASTY  08/11/2012   Procedure: TOTAL HIP ARTHROPLASTY;  Surgeon: Loanne Drilling, MD;  Location: [facility];  Service: Orthopedics;  Laterality: Right;  . TOTAL KNEE ARTHROPLASTY  09/29/2011   Procedure: TOTAL KNEE ARTHROPLASTY;  Surgeon: Loanne Drilling;  Location: [facility];  Service: Orthopedics;  Laterality: Left;  . TUBAL LIGATION  1981   Social History   Occupational History  . Not on file  Tobacco Use  . Smoking status: Never Smoker  . Smokeless tobacco: Never Used  Substance and Sexual Activity  . Alcohol use: No  . Drug use: No  . Sexual activity: Not on file

## 2021-07-03 ENCOUNTER — Encounter: Payer: Self-pay | Admitting: Physician Assistant

## 2021-07-03 ENCOUNTER — Ambulatory Visit (INDEPENDENT_AMBULATORY_CARE_PROVIDER_SITE_OTHER): Payer: Medicare Other | Admitting: Physician Assistant

## 2021-07-03 ENCOUNTER — Other Ambulatory Visit: Payer: Self-pay

## 2021-07-03 DIAGNOSIS — B351 Tinea unguium: Secondary | ICD-10-CM

## 2021-07-03 NOTE — Progress Notes (Signed)
Office Visit Note   Patient: Christina Duarte           Date of Birth: December 03, 1945           MRN: [id number] Visit Date: 07/03/2021              Requested by: [name], DO [address] [place],  [place] [address] PCP: [name], DO  No chief complaint on file.     HPI: Patient presents today for trimming of her nails.  She had them last trimmed 3 months ago and feels uncomfortable trimming them on her own.  Assessment & Plan: Visit Diagnoses: No diagnosis found.  Plan: Follow-up in 3 months or sooner.  Follow-Up Instructions: No follow-ups on file.   Ortho Exam  Patient is alert, oriented, no adenopathy, well-dressed, normal affect, normal respiratory effort. Examination she has palpable dorsalis pedis pulses no open ulcers or areas no swelling no signs of infection.  She has onychomycotic nails x10.  These were trimmed to a stable soft surface.  Imaging: No results found. No images are attached to the encounter.  Labs: Lab Results  Component Value Date   REPTSTATUS 02/01/2018 FINAL 01/31/2018   CULT  01/31/2018    NO GROWTH Performed at [facility], [address] [place], [place] [address]    Leanor Kail PNEUMONIAE 10/02/2012     Lab Results  Component Value Date   ALBUMIN 4.5 03/06/2018   ALBUMIN 4.7 01/31/2018   ALBUMIN 3.9 08/05/2012    Lab Results  Component Value Date   MG 1.6 (L) 03/06/2018   No results found for: VD25OH  No results found for: PREALBUMIN CBC EXTENDED Latest Ref Rng & Units 06/11/2019 03/06/2018 01/31/2018  WBC 4.0 - 10.5 K/uL 5.4 5.0 6.8  RBC 3.87 - 5.11 MIL/uL 5.09 5.58(H) 5.50(H)  HGB 12.0 - 15.0 g/dL 81.1 16.0(H) 15.6(H)  HCT 36.0 - 46.0 % 44.7 46.1(H) 46.5(H)  PLT 150 - 400 K/uL 247 270 274  NEUTROABS 1.7 - 7.7 K/uL 3.7 3.4 6.0  LYMPHSABS 0.7 - 4.0 K/uL 1.1 0.7 0.4(L)     There is no height or weight on file to calculate BMI.  Orders:  No orders of the defined types were placed in  this encounter.  No orders of the defined types were placed in this encounter.    Procedures: No procedures performed  Clinical Data: No additional findings.  ROS:  All other systems negative, except as noted in the HPI. Review of Systems  Objective: Vital Signs: There were no vitals taken for this visit.  Specialty Comments:  No specialty comments available.  PMFS History: Patient Active Problem List   Diagnosis Date Noted   Nonspecific abnormal electrocardiogram (ECG) (EKG) 03/13/2019   PSVT (paroxysmal supraventricular tachycardia) (HCC) 03/06/2018   Varicose veins of bilateral lower extremities with other complications 01/04/2018   Status post foot surgery 01/31/2013   Postop Hypokalemia 08/12/2012   OA (osteoarthritis) of hip 08/11/2012   Hyponatremia 09/30/2011   Osteoarthritis of left knee 09/30/2011   Past Medical History:  Diagnosis Date   Arthritis    "ALL OVER"  --OA LEFT KNEE-PLANS KNEE REPLACEMENT--S/P RT KNEE REPLACEMENT   Blood transfusion    Complication of anesthesia    woke up during colonoscopy   H/O blood clots    RIGHT KNEE   Heart palpitations    PSVT (paroxysmal supraventricular tachycardia) (HCC) 03/06/2018   EKG at the emergency room 03/06/2018: SVT at a rate  of 137 bpm,  anteroseptal infarct old.   PVC's (premature ventricular contractions)    PVC's (premature ventricular contractions)     Family History  Problem Relation Age of Onset   Heart disease Father    Colon cancer Neg Hx    Breast cancer Neg Hx     Past Surgical History:  Procedure Laterality Date   BUNIONECTOMY Right 02/17/11   Orpha Bur Right 01/20/13   McBride   COLONOSCOPY     ENDOVENOUS ABLATION SAPHENOUS VEIN W/ LASER Right 07/07/2018   endovenous laser ablation R greater saphenous vein and stab phlebectomy > 20 incisions R leg by Fabienne Bruns MD    ENDOVENOUS ABLATION SAPHENOUS VEIN W/ LASER Left 10/06/2018   endovenous laser ablation left greater  saphenous vein and stab phlebectomy > 20 incisions left leg by Fabienne Bruns MD    FOOT SURGERY  2012   RIGHT -BUNIONECTOMY   Hammer toe repair Right 02/17/11   HYSTEROSCOPY WITH D & C N/A 09/13/2013   Procedure: DILATATION AND CURETTAGE /HYSTEROSCOPY;  Surgeon: Miguel Aschoff, MD;  Location: [facility];  Service: Gynecology;  Laterality: N/A;   JOINT REPLACEMENT  2007    RIGHT KNEE   TONSILLECTOMY  1964   TOTAL HIP ARTHROPLASTY  08/11/2012   Procedure: TOTAL HIP ARTHROPLASTY;  Surgeon: Loanne Drilling, MD;  Location: [facility];  Service: Orthopedics;  Laterality: Right;   TOTAL KNEE ARTHROPLASTY  09/29/2011   Procedure: TOTAL KNEE ARTHROPLASTY;  Surgeon: Loanne Drilling;  Location: [facility];  Service: Orthopedics;  Laterality: Left;   TUBAL LIGATION  1981   Social History   Occupational History   Not on file  Tobacco Use   Smoking status: Never   Smokeless tobacco: Never  Substance and Sexual Activity   Alcohol use: No   Drug use: No   Sexual activity: Not on file

## 2021-08-20 ENCOUNTER — Encounter: Payer: Self-pay | Admitting: Student

## 2021-09-29 ENCOUNTER — Encounter: Payer: Self-pay | Admitting: Cardiology

## 2021-10-02 ENCOUNTER — Encounter: Payer: Self-pay | Admitting: Family

## 2021-10-02 ENCOUNTER — Ambulatory Visit (INDEPENDENT_AMBULATORY_CARE_PROVIDER_SITE_OTHER): Payer: Medicare Other | Admitting: Family

## 2021-10-02 DIAGNOSIS — B351 Tinea unguium: Secondary | ICD-10-CM | POA: Diagnosis not present

## 2021-10-02 NOTE — Progress Notes (Signed)
Office Visit Note   Patient: Christina Duarte           Date of Birth: 1946-03-06           MRN: [id number] Visit Date: 10/02/2021              Requested by: [name], DO [address] [place],  [place] [address] PCP: [name], DO  Chief Complaint  Patient presents with   Right Foot - Follow-up    Toenail trimming   Left Foot - Follow-up    Toenail trimming       HPI: The patient is a 75 year old woman who is seen today in routine follow-up.  She is here for bilateral foot evaluation.    She is unable to safely trim her own nails.  No new concerns.  Assessment & Plan: Visit Diagnoses: No diagnosis found.  Plan: Follow-up in 3 months or sooner Should any concerns arise  Follow-Up Instructions: Return in about 3 months (around 12/31/2021).   Ortho Exam  Patient is alert, oriented, no adenopathy, well-dressed, normal affect, normal respiratory effort. Examination she has palpable dorsalis pedis pulses bilaterally.  No open ulcers or areas no swelling no signs of infection.  She has onychomycotic nails x10.  These were trimmed today without incident.  Imaging: No results found. No images are attached to the encounter.  Labs: Lab Results  Component Value Date   REPTSTATUS 02/01/2018 FINAL 01/31/2018   CULT  01/31/2018    NO GROWTH Performed at [facility], [address] [place], [place] [address]    Leanor Kail PNEUMONIAE 10/02/2012     Lab Results  Component Value Date   ALBUMIN 4.5 03/06/2018   ALBUMIN 4.7 01/31/2018   ALBUMIN 3.9 08/05/2012    Lab Results  Component Value Date   MG 1.6 (L) 03/06/2018   No results found for: VD25OH  No results found for: PREALBUMIN CBC EXTENDED Latest Ref Rng & Units 06/11/2019 03/06/2018 01/31/2018  WBC 4.0 - 10.5 K/uL 5.4 5.0 6.8  RBC 3.87 - 5.11 MIL/uL 5.09 5.58(H) 5.50(H)  HGB 12.0 - 15.0 g/dL 10.9 16.0(H) 15.6(H)  HCT 36.0 - 46.0 % 44.7 46.1(H) 46.5(H)  PLT 150 - 400 K/uL 247  270 274  NEUTROABS 1.7 - 7.7 K/uL 3.7 3.4 6.0  LYMPHSABS 0.7 - 4.0 K/uL 1.1 0.7 0.4(L)     There is no height or weight on file to calculate BMI.  Orders:  No orders of the defined types were placed in this encounter.  No orders of the defined types were placed in this encounter.    Procedures: No procedures performed  Clinical Data: No additional findings.  ROS:  All other systems negative, except as noted in the HPI. Review of Systems  Skin:  Negative for color change and wound.  All other systems reviewed and are negative.  Objective: Vital Signs: There were no vitals taken for this visit.  Specialty Comments:  No specialty comments available.  PMFS History: Patient Active Problem List   Diagnosis Date Noted   Nonspecific abnormal electrocardiogram (ECG) (EKG) 03/13/2019   PSVT (paroxysmal supraventricular tachycardia) (HCC) 03/06/2018   Varicose veins of bilateral lower extremities with other complications 01/04/2018   Status post foot surgery 01/31/2013   Postop Hypokalemia 08/12/2012   OA (osteoarthritis) of hip 08/11/2012   Hyponatremia 09/30/2011   Osteoarthritis of left knee 09/30/2011   Past Medical History:  Diagnosis Date   Arthritis    "ALL OVER"  --  OA LEFT KNEE-PLANS KNEE REPLACEMENT--S/P RT KNEE REPLACEMENT   Blood transfusion    Complication of anesthesia    woke up during colonoscopy   H/O blood clots    RIGHT KNEE   Heart palpitations    PSVT (paroxysmal supraventricular tachycardia) (HCC) 03/06/2018   EKG at the emergency room 03/06/2018: SVT at a rate of 137 bpm,  anteroseptal infarct old.   PVC's (premature ventricular contractions)    PVC's (premature ventricular contractions)     Family History  Problem Relation Age of Onset   Heart disease Father    Colon cancer Neg Hx    Breast cancer Neg Hx     Past Surgical History:  Procedure Laterality Date   BUNIONECTOMY Right 02/17/11   Orpha Bur Right 01/20/13   McBride    COLONOSCOPY     ENDOVENOUS ABLATION SAPHENOUS VEIN W/ LASER Right 07/07/2018   endovenous laser ablation R greater saphenous vein and stab phlebectomy > 20 incisions R leg by Fabienne Bruns MD    ENDOVENOUS ABLATION SAPHENOUS VEIN W/ LASER Left 10/06/2018   endovenous laser ablation left greater saphenous vein and stab phlebectomy > 20 incisions left leg by Fabienne Bruns MD    FOOT SURGERY  2012   RIGHT -BUNIONECTOMY   Hammer toe repair Right 02/17/11   HYSTEROSCOPY WITH D & C N/A 09/13/2013   Procedure: DILATATION AND CURETTAGE /HYSTEROSCOPY;  Surgeon: Miguel Aschoff, MD;  Location: [facility];  Service: Gynecology;  Laterality: N/A;   JOINT REPLACEMENT  2007    RIGHT KNEE   TONSILLECTOMY  1964   TOTAL HIP ARTHROPLASTY  08/11/2012   Procedure: TOTAL HIP ARTHROPLASTY;  Surgeon: Loanne Drilling, MD;  Location: [facility];  Service: Orthopedics;  Laterality: Right;   TOTAL KNEE ARTHROPLASTY  09/29/2011   Procedure: TOTAL KNEE ARTHROPLASTY;  Surgeon: Loanne Drilling;  Location: [facility];  Service: Orthopedics;  Laterality: Left;   TUBAL LIGATION  1981   Social History   Occupational History   Not on file  Tobacco Use   Smoking status: Never   Smokeless tobacco: Never  Substance and Sexual Activity   Alcohol use: No   Drug use: No   Sexual activity: Not on file

## 2021-11-11 ENCOUNTER — Other Ambulatory Visit: Payer: Self-pay | Admitting: Family Medicine

## 2021-11-11 DIAGNOSIS — Z1231 Encounter for screening mammogram for malignant neoplasm of breast: Secondary | ICD-10-CM

## 2021-11-12 ENCOUNTER — Ambulatory Visit
Admission: RE | Admit: 2021-11-12 | Discharge: 2021-11-12 | Disposition: A | Discharge status: Home / Self Care | Payer: Medicare Other | Source: Ambulatory Visit | Attending: Family Medicine | Admitting: Family Medicine

## 2021-11-12 DIAGNOSIS — Z1231 Encounter for screening mammogram for malignant neoplasm of breast: Secondary | ICD-10-CM

## 2021-11-13 ENCOUNTER — Encounter: Payer: Self-pay | Admitting: Student

## 2021-11-13 ENCOUNTER — Ambulatory Visit: Payer: Medicare Other | Admitting: Student

## 2021-11-13 ENCOUNTER — Other Ambulatory Visit: Payer: Self-pay

## 2021-11-13 VITALS — BP 122/74 | HR 73 | Temp 98.0°F | Resp 16 | Ht 70.0 in | Wt 223.0 lb

## 2021-11-13 DIAGNOSIS — I471 Supraventricular tachycardia, unspecified: Secondary | ICD-10-CM

## 2021-11-13 DIAGNOSIS — I10 Essential (primary) hypertension: Secondary | ICD-10-CM

## 2021-11-13 DIAGNOSIS — Z01818 Encounter for other preprocedural examination: Secondary | ICD-10-CM

## 2021-11-13 DIAGNOSIS — R0609 Other forms of dyspnea: Secondary | ICD-10-CM

## 2021-11-13 NOTE — Progress Notes (Signed)
Primary Physician/Referring:  Janie Morning, DO  Patient ID: Christina Duarte, female    DOB: 06-22-1946, 76 y.o.   MRN: [id number]  Chief Complaint  Patient presents with   Pre-op Exam    Dental procedure    HPI: Christina Duarte  is a 76 y.o. African-American female with obesity, hyperlipidemia, varicose vein followed by Dr. Kellie Simmering, degenerative joint disease, chronic palpitations, found to have SVT that resolved with Valsalva maneuver on 03/31/2018.   Patient was last seen in our office 09/24/2020 by Dr. Einar Gip at which time she was stable from a cardiovascular standpoint and advised to follow-up as needed.  However she is now referred back to our office for preoperative risk stratification.  She has upcoming her tissue biopsy of her lower jaw under general anesthesia.   Patient reports she continues to have sporadic episodes of palpitations that last 2 to 5 minutes and terminate with Valsalva maneuver.  The symptoms are typically well controlled with metoprolol.  Denies chest pain, syncope, near syncope, orthopnea, PND.  However she does report dyspnea on exertion, particularly walking from her car to her house or going up the stairs at her daughter's home.  Also reports bilateral leg edema, worse on the right.  She states leg edema is chronic.  Patient also has upcoming hip surgery scheduled for March.  Due to hip pain her physical activity has been limited over the last several months.   Past Medical History:  Diagnosis Date   Arthritis    "ALL OVER"  --OA LEFT KNEE-PLANS KNEE REPLACEMENT--S/P RT KNEE REPLACEMENT   Blood transfusion    Complication of anesthesia    woke up during colonoscopy   H/O blood clots    RIGHT KNEE   Heart palpitations    PSVT (paroxysmal supraventricular tachycardia) ([facility]) 03/06/2018   EKG at the emergency room 03/06/2018: SVT at a rate of 137 bpm,  anteroseptal infarct old.   PVC's (premature ventricular contractions)    PVC's (premature ventricular  contractions)    Past Surgical History:  Procedure Laterality Date   BUNIONECTOMY Right 02/17/11   Alphonzo Grieve Right 01/20/13   [facility]   COLONOSCOPY     ENDOVENOUS ABLATION SAPHENOUS VEIN W/ LASER Right 07/07/2018   endovenous laser ablation R greater saphenous vein and stab phlebectomy > 20 incisions R leg by Ruta Hinds MD    ENDOVENOUS ABLATION SAPHENOUS VEIN W/ LASER Left 10/06/2018   endovenous laser ablation left greater saphenous vein and stab phlebectomy > 20 incisions left leg by Ruta Hinds MD    FOOT SURGERY  2012   RIGHT -BUNIONECTOMY   Hammer toe repair Right 02/17/11   HYSTEROSCOPY WITH D & C N/A 09/13/2013   Procedure: DILATATION AND CURETTAGE /HYSTEROSCOPY;  Surgeon: Gus Height, MD;  Location: [facility];  Service: Gynecology;  Laterality: N/A;   JOINT REPLACEMENT  2007    RIGHT KNEE   TONSILLECTOMY  1964   TOTAL HIP ARTHROPLASTY  08/11/2012   Procedure: TOTAL HIP ARTHROPLASTY;  Surgeon: Gearlean Alf, MD;  Location: [facility];  Service: Orthopedics;  Laterality: Right;   TOTAL KNEE ARTHROPLASTY  09/29/2011   Procedure: TOTAL KNEE ARTHROPLASTY;  Surgeon: Gearlean Alf;  Location: [facility];  Service: Orthopedics;  Laterality: Left;   TUBAL LIGATION  1981   Family History  Problem Relation Age of Onset   Heart disease Father    Colon cancer Neg Hx    Breast cancer Neg Hx    Social History  Tobacco Use   Smoking status: Never   Smokeless tobacco: Never  Substance Use Topics   Alcohol use: No   Marital Status: Divorced   ROS   Review of Systems  Constitutional: Positive for malaise/fatigue.  Cardiovascular:  Positive for dyspnea on exertion, leg swelling and palpitations. Negative for chest pain, claudication, near-syncope, orthopnea, paroxysmal nocturnal dyspnea and syncope.  Neurological:  Negative for dizziness.     Objective  Blood pressure 122/74, pulse 73, temperature 98 F (36.7 C), temperature source Temporal, resp. rate 16, height 5'  10" (1.778 m), weight 223 lb (101.2 kg), SpO2 99 %. Body mass index is 32 kg/m.  Vitals with BMI 11/13/2021 11/13/2021 04/02/2021  Height - 5\' 10"  -  Weight - 223 lbs 214 lbs 6 oz  BMI - 32 -  Systolic [id number] [id number] -  Diastolic 74 [id number] -  Pulse 73 80 -    Physical Exam Vitals reviewed.  Constitutional:      General: She is not in acute distress.    Appearance: She is well-developed. She is obese.  Neck:     Thyroid: No thyromegaly.     Vascular: No JVD.  Cardiovascular:     Rate and Rhythm: Normal rate and regular rhythm.     Pulses: Normal pulses and intact distal pulses.     Heart sounds: Normal heart sounds. No murmur heard.   No gallop.  Pulmonary:     Effort: Pulmonary effort is normal.     Breath sounds: Normal breath sounds.  Musculoskeletal:     Cervical back: Neck supple.     Right lower leg: Edema (minimal) present.     Left lower leg: Edema (minmal) present.  Skin:    General: Skin is warm and dry.   Laboratory examination:   CMP Latest Ref Rng & Units 06/11/2019 03/06/2018 01/31/2018  Glucose 70 - 99 mg/dL 94 114(H) 126(H)  BUN 8 - 23 mg/dL 15 17 28(H)  Creatinine 0.44 - 1.00 mg/dL 0.69 0.73 0.79  Sodium 135 - 145 mmol/L 139 137 139  Potassium 3.5 - 5.1 mmol/L 3.8 3.1(L) 3.9  Chloride 98 - 111 mmol/L 105 106 104  CO2 22 - 32 mmol/L 26 20(L) 23  Calcium 8.9 - 10.3 mg/dL 9.6 9.7 9.7  Total Protein 6.5 - 8.1 g/dL - 7.8 8.3(H)  Total Bilirubin 0.3 - 1.2 mg/dL - 1.0 1.6(H)  Alkaline Phos 38 - 126 U/L - 75 85  AST 15 - 41 U/L - 49(H) 45(H)  ALT 14 - 54 U/L - 47 44   CBC Latest Ref Rng & Units 06/11/2019 03/06/2018 01/31/2018  WBC 4.0 - 10.5 K/uL 5.4 5.0 6.8  Hemoglobin 12.0 - 15.0 g/dL 14.1 16.0(H) 15.6(H)  Hematocrit 36.0 - 46.0 % 44.7 46.1(H) 46.5(H)  Platelets 150 - 400 K/uL 247 270 274   Lipid Panel  No results found for: CHOL, TRIG, HDL, CHOLHDL, VLDL, LDLCALC, LDLDIRECT HEMOGLOBIN A1C No results found for: HGBA1C, MPG TSH No results for input(s): TSH in the last  8760 hours.  External labs: 07/05/2021: HDL 73, LDL 115, total cholesterol 199, triglycerides 46  12/28/2020: BUN 11, creatinine 0.55, GFR >60 TSH 3.34  Allergies   Allergies  Allergen Reactions   Aspirin Other (See Comments)    Pt cant take when given with codeine. Makes her feel loopy Can take aspirin alone w/o any problem. Pt cant take when given with codeine. Makes her feel loopy Can take aspirin alone w/o any problem. Pt cant take when  given with codeine. Makes her feel loopy Can take aspirin alone w/o any problem.   Codeine Other (See Comments)    Pt cant take when given with aspirin Pt cant take when given with aspirin Pt cant take when given with aspirin    Medications Prior to Visit:   Outpatient Medications Prior to Visit  Medication Sig Dispense Refill   amLODipine (NORVASC) 2.5 MG tablet Take 2.5 mg by mouth daily.     aspirin 81 MG tablet Take 81 mg by mouth daily.     atorvastatin (LIPITOR) 40 MG tablet atorvastatin 40 mg tablet     Cholecalciferol (VITAMIN D-3) 5000 UNITS TABS Take 1 tablet by mouth daily.      cyanocobalamin 1000 MCG tablet cyanocobalamin (vitamin B-12)     hydrOXYzine (ATARAX) 25 MG tablet Take 1 tablet by mouth as needed.     metoprolol succinate (TOPROL-XL) 50 MG 24 hr tablet metoprolol succinate ER 50 mg tablet,extended release 24 hr     Multiple Vitamins-Minerals (MULTIVITAMIN ADULT PO) multivitamin     acetaminophen (TYLENOL) 80 MG chewable tablet Chew 80 mg by mouth every 6 (six) hours as needed.     fluticasone (FLONASE) 50 MCG/ACT nasal spray Place 2 sprays into both nostrils daily for 10 days. 1 g 0   verapamil (CALAN) 80 MG tablet TAKE 1 TABLET (80 MG TOTAL) BY MOUTH DAILY AS NEEDED (RAPID HEART BEAT (SVT)). 90 tablet 3   No facility-administered medications prior to visit.   Final Medications at End of Visit    Current Meds  Medication Sig   amLODipine (NORVASC) 2.5 MG tablet Take 2.5 mg by mouth daily.   aspirin 81 MG tablet  Take 81 mg by mouth daily.   atorvastatin (LIPITOR) 40 MG tablet atorvastatin 40 mg tablet   Cholecalciferol (VITAMIN D-3) 5000 UNITS TABS Take 1 tablet by mouth daily.    cyanocobalamin 1000 MCG tablet cyanocobalamin (vitamin B-12)   hydrOXYzine (ATARAX) 25 MG tablet Take 1 tablet by mouth as needed.   metoprolol succinate (TOPROL-XL) 50 MG 24 hr tablet metoprolol succinate ER 50 mg tablet,extended release 24 hr   Multiple Vitamins-Minerals (MULTIVITAMIN ADULT PO) multivitamin    Radiology   MM 3D SCREEN BREAST BILATERAL  Result Date: 11/12/2021 CLINICAL DATA:  Screening. EXAM: DIGITAL SCREENING BILATERAL MAMMOGRAM WITH TOMOSYNTHESIS AND CAD TECHNIQUE: Bilateral screening digital craniocaudal and mediolateral oblique mammograms were obtained. Bilateral screening digital breast tomosynthesis was performed. The images were evaluated with computer-aided detection. COMPARISON:  Previous exam(s). ACR Breast Density Category c: The breast tissue is heterogeneously dense, which may obscure small masses. FINDINGS: There are no findings suspicious for malignancy. IMPRESSION: No mammographic evidence of malignancy. A result letter of this screening mammogram will be mailed directly to the patient. RECOMMENDATION: Screening mammogram in one year. (Code:SM-B-01Y) BI-RADS CATEGORY  1: Negative. Electronically Signed   By: Valentino Saxon M.D.   On: 11/12/2021 11:58    Cardiac Studies:   Lexiscan sestamibi stress test 08/23/2018: 1. The resting electrocardiogram demonstrated normal sinus rhythm, prolonged QT, no resting arrhythmias and normal rest repolarization.  Stress EKG is non-diagnostic for ischemia as it a pharmacologic stress using Lexiscan. The patient developed significant symptoms which included Headache.  2. Myocardial perfusion imaging is normal. Overall left ventricular systolic function was normal without regional wall motion abnormalities. The left ventricular ejection fraction was 65%.  This  is a low risk study.  Carotid artery duplex 08/05/2018: No hemodynamically significant arterial disease in the internal carotid artery  bilaterally. Mild soft plaque noted bilaterally. Antegrade right vertebral artery flow. Antegrade left vertebral artery flow.  2 week event monitor 06/30/2018-07/13/2018: Dominant rhythm Normal sinus. 7 patient triggered events occured without reported symptoms that correlated with NSR with occasional PACs. Brief Episodes of ectopic atrial rhythm (day 1 at 11:43am and day 2 at 1243am). No A fib or SVT occurred.   EKG  11/13/2021: Sinus rhythm at a rate of 60 bpm.  Normal axis.  Left atrial enlargement.  Poor R wave progression, cannot exclude anteroseptal infarct old.  No evidence of ischemia or underlying injury pattern.  Compared to EKG 09/24/2020 no significant change.  09/24/2020: Normal sinus rhythm at rate of 61 bpm, left atrial enlargement, normal axis.  Poor R wave progression, probably normal variant.  No evidence of ischemia, otherwise normal EKG.   EKG at the emergency room 03/06/2018: SVT at a rate of 137 bpm,  anteroseptal infarct old.  Assessment   Encounter for preoperative assessment  DOE (dyspnea on exertion) - Plan: PCV ECHOCARDIOGRAM COMPLETE, PCV MYOCARDIAL PERFUSION WITH LEXISCAN  PSVT (paroxysmal supraventricular tachycardia) ([facility]) - Plan: EKG 12-Lead, PCV ECHOCARDIOGRAM COMPLETE  Essential hypertension  No orders of the defined types were placed in this encounter.  Medications Discontinued During This Encounter  Medication Reason   acetaminophen (TYLENOL) 80 MG chewable tablet    fluticasone (FLONASE) 50 MCG/ACT nasal spray    verapamil (CALAN) 80 MG tablet      Recommendations:   Christina Duarte  is a 76 y.o.  African-American female with obesity, hyperlipidemia, varicose vein followed by Dr. Kellie Simmering, degenerative joint disease, chronic palpitations, found to have SVT that resolved with Valsalva maneuver on 03/31/2018.    Patient was last seen in our office 09/24/2020 by Dr. Einar Gip at which time she was stable from a cardiovascular standpoint and advised to follow-up as needed.  However she is now referred back to our office for preoperative risk stratification.  She has upcoming her tissue biopsy of her lower jaw under general anesthesia.   Given multiple cardiovascular risk factors including advanced age, obesity, hyperlipidemia as well as symptoms of dyspnea on exertion and leg edema recommend further cardiac testing prior to disc stratify patient for upcoming jaw and hip surgeries.  Will obtain echocardiogram as well as stress test.  Unfortunately given hip pain and significant dyspnea on exertion patient has not a treadmill candidate.  Patient does continue to have episodes of palpitations suggestive of SVT, these are fairly well controlled with metoprolol and terminate with Valsalva maneuver.  Will not make changes to medications at this time.  If patient's echocardiogram and stress test are without significant abnormalities she may proceed with low risk from a cardiac standpoint with upcoming surgeries.  Follow-up in 6 to 8 weeks, sooner if needed, for results of cardiac testing.   Alethia Berthold, PA-C 11/13/2021, 11:41 AM Office: [phone]

## 2021-12-09 ENCOUNTER — Other Ambulatory Visit: Payer: Federal, State, Local not specified - PPO

## 2021-12-10 ENCOUNTER — Other Ambulatory Visit: Payer: Self-pay

## 2021-12-10 ENCOUNTER — Ambulatory Visit: Payer: Medicare Other

## 2021-12-10 DIAGNOSIS — I471 Supraventricular tachycardia: Secondary | ICD-10-CM

## 2021-12-10 DIAGNOSIS — R0609 Other forms of dyspnea: Secondary | ICD-10-CM

## 2021-12-16 ENCOUNTER — Encounter: Payer: Self-pay | Admitting: Student

## 2021-12-24 NOTE — H&P (Signed)
TOTAL HIP ADMISSION H&P  Patient is admitted for left total hip arthroplasty.  Subjective:  Chief Complaint: Left hip pain  HPI: Christina Duarte, 76 y.o. female, has a history of pain and functional disability in the left hip due to arthritis and patient has failed non-surgical conservative treatments for greater than 12 weeks to include NSAID's and/or analgesics and activity modification. Onset of symptoms was gradual, starting 10 years ago with gradually worsening course since that time. The patient noted no past surgery on the left hip. Patient currently rates pain in the left hip at 7 out of 10 with activity. Patient has worsening of pain with activity and weight bearing, crepitus, and joint swelling. Patient has evidence of     by imaging studies. This condition presents safety issues increasing the risk of falls. There is no current active infection.  Patient Active Problem List   Diagnosis Date Noted   Nonspecific abnormal electrocardiogram (ECG) (EKG) 03/13/2019   PSVT (paroxysmal supraventricular tachycardia) ([facility]) 03/06/2018   Varicose veins of bilateral lower extremities with other complications 01/04/2018   Status post foot surgery 01/31/2013   Postop Hypokalemia 08/12/2012   OA (osteoarthritis) of hip 08/11/2012   Hyponatremia 09/30/2011   Osteoarthritis of left knee 09/30/2011    Past Medical History:  Diagnosis Date   Arthritis    "ALL OVER"  --OA LEFT KNEE-PLANS KNEE REPLACEMENT--S/P RT KNEE REPLACEMENT   Blood transfusion    Complication of anesthesia    woke up during colonoscopy   H/O blood clots    RIGHT KNEE   Heart palpitations    PSVT (paroxysmal supraventricular tachycardia) ([facility]) 03/06/2018   EKG at the emergency room 03/06/2018: SVT at a rate of 137 bpm,  anteroseptal infarct old.   PVC's (premature ventricular contractions)    PVC's (premature ventricular contractions)     Past Surgical History:  Procedure Laterality Date   BUNIONECTOMY Right 02/17/11    Orpha Bur Right 01/20/13   [facility]Bride   COLONOSCOPY     ENDOVENOUS ABLATION SAPHENOUS VEIN W/ LASER Right 07/07/2018   endovenous laser ablation R greater saphenous vein and stab phlebectomy > 20 incisions R leg by Fabienne Bruns MD    ENDOVENOUS ABLATION SAPHENOUS VEIN W/ LASER Left 10/06/2018   endovenous laser ablation left greater saphenous vein and stab phlebectomy > 20 incisions left leg by Fabienne Bruns MD    FOOT SURGERY  2012   RIGHT -BUNIONECTOMY   Hammer toe repair Right 02/17/11   HYSTEROSCOPY WITH D & C N/A 09/13/2013   Procedure: DILATATION AND CURETTAGE /HYSTEROSCOPY;  Surgeon: Miguel Aschoff, MD;  Location: [facility];  Service: Gynecology;  Laterality: N/A;   JOINT REPLACEMENT  2007    RIGHT KNEE   TONSILLECTOMY  1964   TOTAL HIP ARTHROPLASTY  08/11/2012   Procedure: TOTAL HIP ARTHROPLASTY;  Surgeon: Loanne Drilling, MD;  Location: [facility];  Service: Orthopedics;  Laterality: Right;   TOTAL KNEE ARTHROPLASTY  09/29/2011   Procedure: TOTAL KNEE ARTHROPLASTY;  Surgeon: Loanne Drilling;  Location: [facility];  Service: Orthopedics;  Laterality: Left;   TUBAL LIGATION  1981    Prior to Admission medications   Medication Sig Start Date End Date Taking? Authorizing Provider  amLODipine (NORVASC) 2.5 MG tablet Take 2.5 mg by mouth daily. 09/08/19   [provider]  aspirin 81 MG tablet Take 81 mg by mouth daily.    [provider]  atorvastatin (LIPITOR) 40 MG tablet atorvastatin 40 mg tablet  [provider]  Cholecalciferol (VITAMIN D-3) 5000 UNITS TABS Take 1 tablet by mouth daily.     [provider]  cyanocobalamin 1000 MCG tablet cyanocobalamin (vitamin B-12)    [provider]  hydrOXYzine (ATARAX) 25 MG tablet Take 1 tablet by mouth as needed. 01/04/21   [provider]  metoprolol succinate (TOPROL-XL) 50 MG 24 hr tablet metoprolol succinate ER 50 mg tablet,extended release 24 hr    [provider]  Multiple  Vitamins-Minerals (MULTIVITAMIN ADULT PO) multivitamin    [provider]    Allergies  Allergen Reactions   Aspirin Other (See Comments)    Pt cant take when given with codeine. Makes her feel loopy Can take aspirin alone w/o any problem. Pt cant take when given with codeine. Makes her feel loopy Can take aspirin alone w/o any problem. Pt cant take when given with codeine. Makes her feel loopy Can take aspirin alone w/o any problem.   Codeine Other (See Comments)    Pt cant take when given with aspirin Pt cant take when given with aspirin Pt cant take when given with aspirin    Social History   Socioeconomic History   Marital status: Divorced    Spouse name: Not on file   Number of children: 3   Years of education: Not on file   Highest education level: Not on file  Occupational History   Not on file  Tobacco Use   Smoking status: Never   Smokeless tobacco: Never  Vaping Use   Vaping Use: Never used  Substance and Sexual Activity   Alcohol use: No   Drug use: No   Sexual activity: Not on file  Other Topics Concern   Not on file  Social History Narrative   Not on file   Social Determinants of Health   Financial Resource Strain: Not on file  Food Insecurity: Not on file  Transportation Needs: Not on file  Physical Activity: Not on file  Stress: Not on file  Social Connections: Not on file  Intimate Partner Violence: Not on file    Tobacco Use: Low Risk    Smoking Tobacco Use: Never   Smokeless Tobacco Use: Never   Passive Exposure: Not on file   Social History   Substance and Sexual Activity  Alcohol Use No    Family History  Problem Relation Age of Onset   Heart disease Father    Colon cancer Neg Hx    Breast cancer Neg Hx     Review of Systems  Constitutional:  Negative for chills and fever.  HENT:  Negative for congestion, sore throat and tinnitus.   Eyes:  Negative for double vision, photophobia and pain.  Respiratory:  Negative  for cough, shortness of breath and wheezing.   Cardiovascular:  Negative for chest pain, palpitations and orthopnea.  Gastrointestinal:  Negative for heartburn, nausea and vomiting.  Genitourinary:  Negative for dysuria, frequency and urgency.  Musculoskeletal:  Positive for joint pain.  Neurological:  Negative for dizziness, weakness and headaches.    Objective:  Physical Exam: Well nourished and well developed.  General: Alert and oriented x3, cooperative and pleasant, no acute distress.  Head: normocephalic, atraumatic, neck supple.  Eyes: EOMI.  Musculoskeletal:  Left Hip Exam:   The range of motion: Flexion to 100 degrees, Internal Rotation to 0 degrees, External Rotation to 10 to 20 degrees, and abduction to 20 degrees without discomfort.   There is no tenderness over the greater trochanteric  bursa.  Calves soft and nontender. Motor function intact in LE. Strength 5/5 LE bilaterally. Neuro: Distal pulses 2+. Sensation to light touch intact in LE.   Imaging Review Plain radiographs demonstrate severe degenerative joint disease of the left hip. The bone quality appears to be adequate for age and reported activity level.  Assessment/Plan:  End stage arthritis, left hip  The patient history, physical examination, clinical judgement of the provider and imaging studies are consistent with end stage degenerative joint disease of the left hip and total hip arthroplasty is deemed medically necessary. The treatment options including medical management, injection therapy, arthroscopy and arthroplasty were discussed at length. The risks and benefits of total hip arthroplasty were presented and reviewed. The risks due to aseptic loosening, infection, stiffness, dislocation/subluxation, thromboembolic complications and other imponderables were discussed. The patient acknowledged the explanation, agreed to proceed with the plan and consent was signed. Patient is being admitted for inpatient  treatment for surgery, pain control, PT, OT, prophylactic antibiotics, VTE prophylaxis, progressive ambulation and ADLs and discharge planning.The patient is planning to be discharged  home .   Patient's anticipated LOS is less than 2 midnights, meeting these requirements: - Lives within 1 hour of care - Has a competent adult at home to recover with post-op recover - NO history of  - Chronic pain requiring opioids  - Diabetes  - Coronary Artery Disease  - Heart failure  - Heart attack  - Stroke  - DVT/VTE  - Respiratory Failure/COPD  - Renal failure  - Anemia  - Advanced Liver disease  Therapy Plans: HEP Disposition: Home with daughter Planned DVT Prophylaxis: Xarelto 10 mg QD DME Needed: Dan Humphreys PCP: [name], MD Cardiologist: Sherilyn Banker, MD (clearance received) TXA: IV Allergies: NKDA Anesthesia Concerns: Woke up during colonoscopy BMI: 32.2 Last HgbA1c: Not diabetic. Pharmacy: [facility])  Other: - Hx palpitations, SVT - Taking clearance form by Dr. Thomasena Edis' office, has appt with her on 3/9 - Has had issues with oxycodone prior (mental fogginess). Discussed using tramadol postop with hydrocodone as last resort. - Questionable blood clot following left TKA; not true DVT  - Patient was instructed on what medications to stop prior to surgery. - Follow-up visit in 2 weeks with Dr. Lequita Halt - Begin physical therapy following surgery - Pre-operative lab work as pre-surgical testing - Prescriptions will be provided in hospital at time of discharge  Arther Abbott, PA-C Orthopedic Surgery [facility] [place]

## 2021-12-25 ENCOUNTER — Encounter: Payer: Self-pay | Admitting: Student

## 2021-12-25 ENCOUNTER — Ambulatory Visit: Payer: Medicare Other | Admitting: Student

## 2021-12-25 ENCOUNTER — Other Ambulatory Visit: Payer: Self-pay

## 2021-12-25 ENCOUNTER — Encounter (HOSPITAL_COMMUNITY): Payer: Self-pay

## 2021-12-25 VITALS — BP 138/82 | HR 64 | Temp 97.9°F | Resp 16 | Ht 70.0 in | Wt 232.6 lb

## 2021-12-25 DIAGNOSIS — I10 Essential (primary) hypertension: Secondary | ICD-10-CM

## 2021-12-25 DIAGNOSIS — I471 Supraventricular tachycardia, unspecified: Secondary | ICD-10-CM

## 2021-12-25 NOTE — Patient Instructions (Addendum)
DUE TO COVID-19 ONLY ONE VISITOR IS ALLOWED TO COME WITH YOU AND STAY IN THE WAITING ROOM ONLY DURING PRE OP AND PROCEDURE.   **NO VISITORS ARE ALLOWED IN THE SHORT STAY AREA OR RECOVERY ROOM!!**   IF YOU WILL BE ADMITTED INTO THE HOSPITAL YOU ARE ALLOWED ONLY TWO SUPPORT PEOPLE DURING VISITATION HOURS ONLY (7 AM -8PM)    Up to two visitors ages 31+ are allowed at one time in a patient's room.  The visitors may rotate out with other people throughout the day.  Additionally, up to two children between the ages of 2 and 72 are allowed and do not count toward the number of allowed visitors.  Children within this age range must be accompanied by an adult visitor.  One adult visitor may remain with the patient overnight and must be in the room by 8 PM.  The support person(s) must pass our screening, gel in and out, and wear a mask at all times, including in the patients room. Patients must also wear a mask when staff or their support person are in the room. Visitors GUEST BADGE MUST BE WORN VISIBLY  One adult visitor may remain with you overnight and MUST be in the room by 8 P.M.  No visitors under the age of 43. Any visitor under the age of 62 must be accompanied by an adult.    COVID SWAB TESTING MUST BE COMPLETED ON:  01-13-22 @ 9:45 AM    Site: [facility] [address] [place] Enter: Main Entrance have a seat in the waiting area to the right of main entrance (DO NOT CHECK IN AT ADMITTING!!!!!) Dial: [phone] to alert staff you have arrived  You are not required to quarantine, however you are required to wear a well-fitted mask when you are out and around people not in your household.  Hand Hygiene often Do NOT share personal items Notify your provider if you are in close contact with someone who has COVID or you develop fever 100.4 or greater, new onset of sneezing, cough, sore throat, shortness of breath or body aches.        Your procedure is scheduled on:  Wednesday, 01-15-22   Report to [facility] Main Entrance    Report to admitting at 7:15 AM   Call this number if you have problems the morning of surgery [phone]   Do not eat food :After Midnight.   After Midnight you may have the following liquids until 7:00 AM DAY OF SURGERY  Water Black Coffee (sugar ok, NO MILK/CREAM OR CREAMERS)  Tea (sugar ok, NO MILK/CREAM OR CREAMERS) regular and decaf                             Plain Jell-O (NO RED)                                           Fruit ices (not with fruit pulp, NO RED)                                     Popsicles (NO RED)  Juice: apple, WHITE grape, WHITE cranberry Sports drinks like Gatorade (NO RED) Clear broth(vegetable,chicken,beef)       The day of surgery:  Drink ONE (1) Pre-Surgery Clear Ensure the morning of surgery at 7:00 AM. Drink in one sitting. Do not sip.  This drink was given to you during your hospital  pre-op appointment visit. Nothing else to drink after completing the Pre-Surgery Clear Ensure          If you have questions, please contact your surgeons office.    Oral Hygiene is also important to reduce your risk of infection.                                    Remember - BRUSH YOUR TEETH THE MORNING OF SURGERY WITH YOUR REGULAR TOOTHPASTE   Do NOT smoke after Midnight   Take these medicines the morning of surgery with A SIP OF WATER:  Tylenol, Amlodipine, Atorvastatin, Metoprolol.  Okay to use eyedrops   Aspirin - stop per doctor's instructions  Stop all vitamins and herbal supplements a week before surgery.     Stop Motrin, Aleve, Ibuprofen a week before surgery.                              You may not have any metal on your body including hair pins, jewelry, and body piercing             Do not wear make-up, lotions, powders, perfumes or deodorant  Do not wear nail polish including gel and S&S,  artificial/acrylic nails, or any other type of covering on natural nails including finger and toenails. If you have artificial nails, gel coating, etc. that needs to be removed by a nail salon please have this removed prior to surgery or surgery may need to be canceled/ delayed if the surgeon/ anesthesia feels like they are unable to be safely monitored.   Do not shave  48 hours prior to surgery.             Contacts, dentures or bridgework may not be worn into surgery.   Bring small overnight bag day of surgery.   Do not bring valuables to the hospital. [facility] IS NOT RESPONSIBLE   FOR VALUABLES.   Please read over the following fact sheets you were given: IF YOU HAVE QUESTIONS ABOUT YOUR PRE-OP INSTRUCTIONS PLEASE CALL [phone] [facility] - Preparing for Surgery Before surgery, you can play an important role.  Because skin is not sterile, your skin needs to be as free of germs as possible.  You can reduce the number of germs on your skin by washing with CHG (chlorahexidine gluconate) soap before surgery.  CHG is an antiseptic cleaner which kills germs and bonds with the skin to continue killing germs even after washing. Please DO NOT use if you have an allergy to CHG or antibacterial soaps.  If your skin becomes reddened/irritated stop using the CHG and inform your nurse when you arrive at Short Stay. Do not shave (including legs and underarms) for at least 48 hours prior to the first CHG shower.  You may shave your face/neck. Please follow these instructions carefully:  1.  Shower with CHG Soap the night before surgery and the  morning of Surgery.  2.  If you  choose to wash your hair, wash your hair first as usual with your  normal  shampoo.  3.  After you shampoo, rinse your hair and body thoroughly to remove the  shampoo.                           4.  Use CHG as you would any other liquid soap.  You can apply chg directly  to the skin and wash                        Gently with a scrungie or clean washcloth.  5.  Apply the CHG Soap to your body ONLY FROM THE NECK DOWN.   Do not use on face/ open                           Wound or open sores. Avoid contact with eyes, ears mouth and genitals (private parts).                       Wash face,  Genitals (private parts) with your normal soap.             6.  Wash thoroughly, paying special attention to the area where your surgery  will be performed.  7.  Thoroughly rinse your body with warm water from the neck down.  8.  DO NOT shower/wash with your normal soap after using and rinsing off  the CHG Soap.                9.  Pat yourself dry with a clean towel.            10.  Wear clean pajamas.            11.  Place clean sheets on your bed the night of your first shower and do not  sleep with pets. Day of Surgery : Do not apply any lotions/deodorants the morning of surgery.  Please wear clean clothes to the hospital/surgery center.  FAILURE TO FOLLOW THESE INSTRUCTIONS MAY RESULT IN THE CANCELLATION OF YOUR SURGERY PATIENT SIGNATURE_________________________________  NURSE SIGNATURE__________________________________  ________________________________________________________________________    Rogelia MireIncentive Spirometer  An incentive spirometer is a tool that can help keep your lungs clear and active. This tool measures how well you are filling your lungs with each breath. Taking long deep breaths may help reverse or decrease the chance of developing breathing (pulmonary) problems (especially infection) following: A long period of time when you are unable to move or be active. BEFORE THE PROCEDURE  If the spirometer includes an indicator to show your best effort, your nurse or respiratory therapist will set it to a desired goal. If possible, sit up straight or lean slightly forward. Try not to slouch. Hold the incentive spirometer in an upright position. INSTRUCTIONS FOR USE  Sit on the edge of your bed if  possible, or sit up as far as you can in bed or on a chair. Hold the incentive spirometer in an upright position. Breathe out normally. Place the mouthpiece in your mouth and seal your lips tightly around it. Breathe in slowly and as deeply as possible, raising the piston or the ball toward the top of the column. Hold your breath for 3-5 seconds or for as long as possible. Allow the piston or ball to fall to the bottom of the column. Remove the mouthpiece  from your mouth and breathe out normally. Rest for a few seconds and repeat Steps 1 through 7 at least 10 times every 1-2 hours when you are awake. Take your time and take a few normal breaths between deep breaths. The spirometer may include an indicator to show your best effort. Use the indicator as a goal to work toward during each repetition. After each set of 10 deep breaths, practice coughing to be sure your lungs are clear. If you have an incision (the cut made at the time of surgery), support your incision when coughing by placing a pillow or rolled up towels firmly against it. Once you are able to get out of bed, walk around indoors and cough well. You may stop using the incentive spirometer when instructed by your caregiver.  RISKS AND COMPLICATIONS Take your time so you do not get dizzy or light-headed. If you are in pain, you may need to take or ask for pain medication before doing incentive spirometry. It is harder to take a deep breath if you are having pain. AFTER USE Rest and breathe slowly and easily. It can be helpful to keep track of a log of your progress. Your caregiver can provide you with a simple table to help with this. If you are using the spirometer at home, follow these instructions: SEEK MEDICAL CARE IF:  You are having difficultly using the spirometer. You have trouble using the spirometer as often as instructed. Your pain medication is not giving enough relief while using the spirometer. You develop fever of  100.5 F (38.1 C) or higher. SEEK IMMEDIATE MEDICAL CARE IF:  You cough up bloody sputum that had not been present before. You develop fever of 102 F (38.9 C) or greater. You develop worsening pain at or near the incision site. MAKE SURE YOU:  Understand these instructions. Will watch your condition. Will get help right away if you are not doing well or get worse. Document Released: 03/02/2007 Document Revised: 01/12/2012 Document Reviewed: 05/03/2007 [employer] Patient Information 2014 [employer], [place].   ________________________________________________________________________    WHAT IS A BLOOD TRANSFUSION? Blood Transfusion Information  A transfusion is the replacement of blood or some of its parts. Blood is made up of multiple cells which provide different functions. Red blood cells carry oxygen and are used for blood loss replacement. White blood cells fight against infection. Platelets control bleeding. Plasma helps clot blood. Other blood products are available for specialized needs, such as hemophilia or other clotting disorders. BEFORE THE TRANSFUSION  Who gives blood for transfusions?  Healthy volunteers who are fully evaluated to make sure their blood is safe. This is blood bank blood. Transfusion therapy is the safest it has ever been in the practice of medicine. Before blood is taken from a donor, a complete history is taken to make sure that person has no history of diseases nor engages in risky social behavior (examples are intravenous drug use or sexual activity with multiple partners). The donor's travel history is screened to minimize risk of transmitting infections, such as malaria. The donated blood is tested for signs of infectious diseases, such as HIV and hepatitis. The blood is then tested to be sure it is compatible with you in order to minimize the chance of a transfusion reaction. If you or a relative donates blood, this is often done in anticipation of surgery  and is not appropriate for emergency situations. It takes many days to process the donated blood. RISKS AND COMPLICATIONS Although transfusion therapy  is very safe and saves many lives, the main dangers of transfusion include:  Getting an infectious disease. Developing a transfusion reaction. This is an allergic reaction to something in the blood you were given. Every precaution is taken to prevent this. The decision to have a blood transfusion has been considered carefully by your caregiver before blood is given. Blood is not given unless the benefits outweigh the risks. AFTER THE TRANSFUSION Right after receiving a blood transfusion, you will usually feel much better and more energetic. This is especially true if your red blood cells have gotten low (anemic). The transfusion raises the level of the red blood cells which carry oxygen, and this usually causes an energy increase. The nurse administering the transfusion will monitor you carefully for complications. HOME CARE INSTRUCTIONS  No special instructions are needed after a transfusion. You may find your energy is better. Speak with your caregiver about any limitations on activity for underlying diseases you may have. SEEK MEDICAL CARE IF:  Your condition is not improving after your transfusion. You develop redness or irritation at the intravenous (IV) site. SEEK IMMEDIATE MEDICAL CARE IF:  Any of the following symptoms occur over the next 12 hours: Shaking chills. You have a temperature by mouth above 102 F (38.9 C), not controlled by medicine. Chest, back, or muscle pain. People around you feel you are not acting correctly or are confused. Shortness of breath or difficulty breathing. Dizziness and fainting. You get a rash or develop hives. You have a decrease in urine output. Your urine turns a dark color or changes to pink, red, or brown. Any of the following symptoms occur over the next 10 days: You have a temperature by mouth  above 102 F (38.9 C), not controlled by medicine. Shortness of breath. Weakness after normal activity. The white part of the eye turns yellow (jaundice). You have a decrease in the amount of urine or are urinating less often. Your urine turns a dark color or changes to pink, red, or brown. Document Released: 10/17/2000 Document Revised: 01/12/2012 Document Reviewed: 06/05/2008 [facility] Patient Information 2014 [place], [place].  _______________________________________________________________________

## 2021-12-25 NOTE — Progress Notes (Signed)
Primary Physician/Referring:  Janie Morning, DO  Patient ID: Christina Duarte, female    DOB: [birth year], 76 y.o.   MRN: [id number]  Chief Complaint  Patient presents with   Results   Follow-up    HPI: Christina Duarte  is a 76 y.o. African-American female with obesity, hyperlipidemia, varicose vein followed by Dr. Kellie Simmering, degenerative joint disease, chronic palpitations, found to have SVT that resolved with Valsalva maneuver on 03/31/2018.   Patient presents for 8-week follow-up for results of cardiac testing.  She was last seen in our office for preoperative risk stratification, therefore stress test and echocardiogram were ordered.  Stress test was overall low risk and echocardiogram revealed LVEF 60-65% with moderate to severe LVH, otherwise no significant abnormalities.  Patient is asymptomatic from a cardiovascular standpoint.  Unfortunately she has gained weight since last office visit.  Blood pressure is elevated at today's visit, however she does states she was rushing to get here.  Physical activity remains limited due to hip pain.  Patient does have chronic bilateral leg edema which is stable.  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   PVC's (premature ventricular contractions)    PVC's (premature ventricular contractions)    Past Surgical History:  Procedure Laterality Date   BUNIONECTOMY Right 02/17/11   Alphonzo Grieve Right 01/20/13   [facility]   COLONOSCOPY     ENDOVENOUS ABLATION SAPHENOUS VEIN W/ LASER Right 07/07/2018   endovenous laser ablation R greater saphenous vein and stab  phlebectomy > 20 incisions R leg by Ruta Hinds MD    ENDOVENOUS ABLATION SAPHENOUS VEIN W/ LASER Left 10/06/2018   endovenous laser ablation left greater saphenous vein and stab phlebectomy > 20 incisions left leg by Ruta Hinds MD    FOOT SURGERY  2012   RIGHT -BUNIONECTOMY   Hammer toe repair Right 02/17/11   HYSTEROSCOPY WITH D & C N/A 09/13/2013   Procedure: DILATATION AND CURETTAGE /HYSTEROSCOPY;  Surgeon: Gus Height, MD;  Location: [facility];  Service: Gynecology;  Laterality: N/A;   JOINT REPLACEMENT  2007    RIGHT KNEE   TONSILLECTOMY  1964   TOTAL HIP ARTHROPLASTY  08/11/2012   Procedure: TOTAL HIP ARTHROPLASTY;  Surgeon: Gearlean Alf, MD;  Location: [facility];  Service: Orthopedics;  Laterality: Right;   TOTAL KNEE ARTHROPLASTY  09/29/2011   Procedure: TOTAL KNEE ARTHROPLASTY;  Surgeon: Gearlean Alf;  Location: [facility];  Service: Orthopedics;  Laterality: Left;   TUBAL LIGATION  1981   Family History  Problem Relation Age of Onset   Heart disease Father    Colon cancer Neg Hx    Breast cancer Neg Hx    Social History   Tobacco Use   Smoking status: Never   Smokeless tobacco: Never  Substance Use Topics   Alcohol use: No   Marital Status: Divorced   ROS   Review of Systems  Constitutional: Negative for malaise/fatigue.  Cardiovascular:  Negative for chest pain, claudication, dyspnea on exertion, leg swelling, near-syncope, orthopnea, palpitations, paroxysmal nocturnal dyspnea and syncope.  Neurological:  Negative for dizziness.     Objective  Blood pressure 138/82, pulse 64, temperature 97.9 F (36.6 C), temperature source Temporal, resp. rate 16, height 5\' 10"  (1.778 m), weight 232 lb 9.6 oz (105.5 kg), SpO2 96 %. Body mass index is 33.37 kg/m.  Vitals with BMI 12/25/2021 11/13/2021 11/13/2021  Height 5\' 10"  - 5\' 10"   Weight 232 lbs 10 oz - 223 lbs  BMI 33.37 - 32  Systolic 138 122 837  Diastolic 82 74 110  Pulse 64 73 80    Physical Exam Vitals  reviewed.  Constitutional:      General: She is not in acute distress.    Appearance: She is well-developed. She is obese.  Neck:     Thyroid: No thyromegaly.     Vascular: No JVD.  Cardiovascular:     Rate and Rhythm: Normal rate and regular rhythm.     Pulses: Normal pulses and intact distal pulses.     Heart sounds: Normal heart sounds. No murmur heard.   No gallop.  Pulmonary:     Effort: Pulmonary effort is normal.     Breath sounds: Normal breath sounds.  Musculoskeletal:     Cervical back: Neck supple.     Right lower leg: Edema (minimal) present.     Left lower leg: Edema (minmal) present.  Skin:    General: Skin is warm and dry.  Physical exam unchanged compared to previous office visit.  Laboratory examination:   CMP Latest Ref Rng & Units 06/11/2019 03/06/2018 01/31/2018  Glucose 70 - 99 mg/dL 94 290(S) 111(B)  BUN 8 - 23 mg/dL 15 17 52(C)  Creatinine 0.44 - 1.00 mg/dL 8.02 2.33 6.12  Sodium 135 - 145 mmol/L 139 137 139  Potassium 3.5 - 5.1 mmol/L 3.8 3.1(L) 3.9  Chloride 98 - 111 mmol/L 105 106 104  CO2 22 - 32 mmol/L 26 20(L) 23  Calcium 8.9 - 10.3 mg/dL 9.6 9.7 9.7  Total Protein 6.5 - 8.1 g/dL - 7.8 2.4(S)  Total Bilirubin 0.3 - 1.2 mg/dL - 1.0 9.7(N)  Alkaline Phos 38 - 126 U/L - 75 85  AST 15 - 41 U/L - 49(H) 45(H)  ALT 14 - 54 U/L - 47 44   CBC Latest Ref Rng & Units 06/11/2019 03/06/2018 01/31/2018  WBC 4.0 - 10.5 K/uL 5.4 5.0 6.8  Hemoglobin 12.0 - 15.0 g/dL 30.0 16.0(H) 15.6(H)  Hematocrit 36.0 - 46.0 % 44.7 46.1(H) 46.5(H)  Platelets 150 - 400 K/uL 247 270 274   Lipid Panel  No results found for: CHOL, TRIG, HDL, CHOLHDL, VLDL, LDLCALC, LDLDIRECT HEMOGLOBIN A1C No results found for: HGBA1C, MPG TSH No results for input(s): TSH in the last 8760 hours.  External labs: 07/05/2021: HDL 73, LDL 115, total cholesterol 199, triglycerides 46  12/28/2020: BUN 11, creatinine 0.55, GFR >60 TSH 3.34  Allergies   Allergies  Allergen Reactions   Aspirin  Other (See Comments)    Pt cant take when given with codeine. Makes her feel loopy Can take aspirin alone w/o any problem. Pt cant take when given with codeine. Makes her feel loopy Can take aspirin alone w/o any problem. Pt cant take when given with codeine. Makes her feel loopy Can take aspirin alone w/o any problem.   Codeine Other (See Comments)    Pt cant take when given with aspirin Pt cant take when given with aspirin Pt cant take when given with aspirin    Medications Prior to Visit:  Outpatient Medications Prior to Visit  Medication Sig Dispense Refill   amLODipine (NORVASC) 2.5 MG tablet Take 2.5 mg by mouth daily.     aspirin 81 MG tablet Take 81 mg by mouth daily.     atorvastatin (LIPITOR) 40 MG tablet atorvastatin 40 mg tablet     Cholecalciferol (VITAMIN D-3) 5000 UNITS TABS Take 1 tablet by mouth daily.      cyanocobalamin 1000 MCG tablet cyanocobalamin (vitamin B-12)     metoprolol succinate (TOPROL-XL) 50 MG 24 hr tablet metoprolol succinate ER 50 mg tablet,extended release 24 hr     Multiple Vitamins-Minerals (MULTIVITAMIN ADULT PO) multivitamin     hydrOXYzine (ATARAX) 25 MG tablet Take 1 tablet by mouth as needed.     No facility-administered medications prior to visit.   Final Medications at End of Visit    Current Meds  Medication Sig   amLODipine (NORVASC) 2.5 MG tablet Take 2.5 mg by mouth daily.   aspirin 81 MG tablet Take 81 mg by mouth daily.   atorvastatin (LIPITOR) 40 MG tablet atorvastatin 40 mg tablet   Cholecalciferol (VITAMIN D-3) 5000 UNITS TABS Take 1 tablet by mouth daily.    cyanocobalamin 1000 MCG tablet cyanocobalamin (vitamin B-12)   metoprolol succinate (TOPROL-XL) 50 MG 24 hr tablet metoprolol succinate ER 50 mg tablet,extended release 24 hr   Multiple Vitamins-Minerals (MULTIVITAMIN ADULT PO) multivitamin   Radiology   No results found.  Cardiac Studies:  PCV MYOCARDIAL PERFUSION WITH LEXISCAN 12/10/2021 Lexiscan nuclear stress  test. 1 Day Rest/Stress Protocol. Stress EKG is non-diagnostic, as this is pharmacological stress test using Lexiscan. Normal myocardial perfusion without convincing evidence of reversible myocardial ischemia. Left ventricular size is normal, no regional wall motion abnormalities, LVEF preserved. Calculated LVEF 64%. Low risk study. Prior Lexiscan Stress test 08/23/2018 reported normal myocardial perfusion, LVEF per gated SPECT 65%, Low risk study.  PCV ECHOCARDIOGRAM COMPLETE 123456 Normal LV systolic function with visual EF 60-65%. Left ventricle cavity is normal in size. Moderate to severe left ventricular hypertrophy. Normal global wall motion. Indeterminate diastolic filling pattern, indeterminate LAP. Aortic valve sclerosis without stenosis. Trace aortic regurgitation. Native mitral valve, mild annular and leaflet calcification, trace regurgitation. Trace tricuspid regurgitation. No prior study for comparison.   Carotid artery duplex 08/05/2018: No hemodynamically significant arterial disease in the internal carotid artery bilaterally. Mild soft plaque noted bilaterally. Antegrade right vertebral artery flow. Antegrade left vertebral artery flow.  2 week event monitor 06/30/2018-07/13/2018: Dominant rhythm Normal sinus. 7 patient triggered events occured without reported symptoms that correlated with NSR with occasional PACs. Brief Episodes of ectopic atrial rhythm (day 1 at 11:43am and day 2 at 1243am). No A fib or SVT occurred.   EKG  11/13/2021: Sinus rhythm at a rate of 60 bpm.  Normal axis.  Left atrial enlargement.  Poor R wave progression, cannot exclude anteroseptal infarct old.  No evidence of ischemia or underlying injury pattern.  Compared to EKG 09/24/2020 no significant change.  09/24/2020: Normal sinus rhythm at rate of 61 bpm, left atrial enlargement, normal axis.  Poor R wave progression, probably normal variant.  No evidence of ischemia, otherwise normal EKG.    EKG at the emergency room 03/06/2018: SVT at a rate of 137 bpm,  anteroseptal infarct old.  Assessment   PSVT (paroxysmal supraventricular tachycardia) ([place])  Essential hypertension  No orders of the defined types were placed in this encounter.  Medications Discontinued During This Encounter  Medication Reason   hydrOXYzine (ATARAX) 25 MG tablet  Recommendations:   Christina Duarte  is a 76 y.o.  African-American female with obesity, hyperlipidemia, varicose vein followed by Dr. Kellie Simmering, degenerative joint disease, chronic palpitations, found to have SVT that resolved with Valsalva maneuver on 03/31/2018.   Patient presents for 8-week follow-up for results of cardiac testing.  She was last seen in our office for preoperative risk stratification, therefore stress test and echocardiogram were ordered.  Stress test was overall low risk and echocardiogram revealed LVEF 60-65% with moderate to severe LVH, otherwise no significant abnormalities.  Patient is fairly asymptomatic from a cardiovascular standpoint.  Reviewed and discussed results of echocardiogram and stress test, details above.  Patient's blood pressure was initially elevated in the office today, however it is improved upon recheck.  Suspect patient's previously reported dyspnea on exertion is related to deconditioning as physical activity has been limited due to arthritic pain.   Advised patient that she may proceed with upcoming procedures at relatively low cardiovascular risk.  We will follow-up in 1 year, sooner if needed.   Alethia Berthold, PA-C 12/25/2021, 9:09 AM Office: [phone] 12/25/2021, 9:09 AM Office: [phone]

## 2021-12-25 NOTE — Progress Notes (Signed)
PCP -  ?Cardiologist - Clearance 12-25-21 Elvin So PA-C ? ?PPM/ICD -  ?Device Orders -  ?Rep Notified -  ? ?Chest x-ray -  ?EKG - 11-13-21 epic ?Stress Test - 12-11-21 ?ECHO - 12-14-21  ?Cardiac Cath -  ? ?Sleep Study -  ?CPAP -  ? ?Fasting Blood Sugar -  ?Checks Blood Sugar _____ times a day ? ?Blood Thinner Instructions: ?Aspirin Instructions: ? ?ERAS Protcol - ?PRE-SURGERY Ensure -  ? ?COVID TEST- 01-13-22 ?COVID vaccine - ? ?Activity-- ?Anesthesia review: HTN, PSVT ? ?Patient denies shortness of breath, fever, cough and chest pain at PAT appointment ? ? ?All instructions explained to the patient, with a verbal understanding of the material. Patient agrees to go over the instructions while at home for a better understanding. Patient also instructed to self quarantine after being tested for COVID-19. The opportunity to ask questions was provided. ?  ?

## 2021-12-31 ENCOUNTER — Encounter: Payer: Self-pay | Admitting: Family

## 2021-12-31 ENCOUNTER — Ambulatory Visit (INDEPENDENT_AMBULATORY_CARE_PROVIDER_SITE_OTHER): Payer: Medicare Other | Admitting: Family

## 2021-12-31 DIAGNOSIS — B351 Tinea unguium: Secondary | ICD-10-CM

## 2021-12-31 NOTE — Progress Notes (Signed)
Office Visit Note   Patient: Christina Duarte           Date of Birth: 01/05/1946           MRN: [id number] Visit Date: 12/31/2021              Requested by: [name], DO [address] [place],  [place] [address] PCP: [name], DO  Chief Complaint  Patient presents with   Left Foot - Follow-up   Right Foot - Follow-up       HPI: The patient is a 76 year old woman who is seen today in routine follow-up.  She is here for bilateral foot evaluation.    She is unable to safely trim her own nails.  No new concerns.  Assessment & Plan: Visit Diagnoses: No diagnosis found.  Plan: Follow-up in 3 months or sooner Should any concerns arise  Follow-Up Instructions: No follow-ups on file.   Ortho Exam  Patient is alert, oriented, no adenopathy, well-dressed, normal affect, normal respiratory effort. Examination she has palpable dorsalis pedis pulses bilaterally.  No open ulcers or areas no swelling no signs of infection.  She has onychomycotic nails x10.  These were trimmed today without incident.  Imaging: No results found. No images are attached to the encounter.  Labs: Lab Results  Component Value Date   REPTSTATUS 02/01/2018 FINAL 01/31/2018   CULT  01/31/2018    NO GROWTH Performed at [facility], [address] [place], [place] [address]    Leanor Kail PNEUMONIAE 10/02/2012     Lab Results  Component Value Date   ALBUMIN 4.5 03/06/2018   ALBUMIN 4.7 01/31/2018   ALBUMIN 3.9 08/05/2012    Lab Results  Component Value Date   MG 1.6 (L) 03/06/2018   No results found for: VD25OH  No results found for: PREALBUMIN CBC EXTENDED Latest Ref Rng & Units 06/11/2019 03/06/2018 01/31/2018  WBC 4.0 - 10.5 K/uL 5.4 5.0 6.8  RBC 3.87 - 5.11 MIL/uL 5.09 5.58(H) 5.50(H)  HGB 12.0 - 15.0 g/dL 63.8 16.0(H) 15.6(H)  HCT 36.0 - 46.0 % 44.7 46.1(H) 46.5(H)  PLT 150 - 400 K/uL 247 270 274  NEUTROABS 1.7 - 7.7 K/uL 3.7 3.4 6.0  LYMPHSABS 0.7 -  4.0 K/uL 1.1 0.7 0.4(L)     There is no height or weight on file to calculate BMI.  Orders:  No orders of the defined types were placed in this encounter.  No orders of the defined types were placed in this encounter.    Procedures: No procedures performed  Clinical Data: No additional findings.  ROS:  All other systems negative, except as noted in the HPI. Review of Systems  Skin:  Negative for color change and wound.  All other systems reviewed and are negative.  Objective: Vital Signs: There were no vitals taken for this visit.  Specialty Comments:  No specialty comments available.  PMFS History: Patient Active Problem List   Diagnosis Date Noted   Nonspecific abnormal electrocardiogram (ECG) (EKG) 03/13/2019   PSVT (paroxysmal supraventricular tachycardia) ([facility]) 03/06/2018   Varicose veins of bilateral lower extremities with other complications 01/04/2018   Status post foot surgery 01/31/2013   Postop Hypokalemia 08/12/2012   OA (osteoarthritis) of hip 08/11/2012   Hyponatremia 09/30/2011   Osteoarthritis of left knee 09/30/2011   Past Medical History:  Diagnosis Date   Arthritis    "ALL OVER"  --OA LEFT KNEE-PLANS KNEE REPLACEMENT--S/P RT KNEE REPLACEMENT   Blood transfusion  Complication of anesthesia    woke up during colonoscopy   H/O blood clots    RIGHT KNEE   Heart palpitations    PSVT (paroxysmal supraventricular tachycardia) ([facility]) 03/06/2018   EKG at the emergency room 03/06/2018: SVT at a rate of 137 bpm,  anteroseptal infarct old.   PVC's (premature ventricular contractions)    PVC's (premature ventricular contractions)     Family History  Problem Relation Age of Onset   Heart disease Father    Colon cancer Neg Hx    Breast cancer Neg Hx     Past Surgical History:  Procedure Laterality Date   BUNIONECTOMY Right 02/17/11   Orpha Bur Right 01/20/13   [facility]ride   COLONOSCOPY     ENDOVENOUS ABLATION SAPHENOUS VEIN W/ LASER  Right 07/07/2018   endovenous laser ablation R greater saphenous vein and stab phlebectomy > 20 incisions R leg by Fabienne Bruns MD    ENDOVENOUS ABLATION SAPHENOUS VEIN W/ LASER Left 10/06/2018   endovenous laser ablation left greater saphenous vein and stab phlebectomy > 20 incisions left leg by Fabienne Bruns MD    FOOT SURGERY  2012   RIGHT -BUNIONECTOMY   Hammer toe repair Right 02/17/11   HYSTEROSCOPY WITH D & C N/A 09/13/2013   Procedure: DILATATION AND CURETTAGE /HYSTEROSCOPY;  Surgeon: Miguel Aschoff, MD;  Location: [facility];  Service: Gynecology;  Laterality: N/A;   JOINT REPLACEMENT  2007    RIGHT KNEE   TONSILLECTOMY  1964   TOTAL HIP ARTHROPLASTY  08/11/2012   Procedure: TOTAL HIP ARTHROPLASTY;  Surgeon: Loanne Drilling, MD;  Location: [facility];  Service: Orthopedics;  Laterality: Right;   TOTAL KNEE ARTHROPLASTY  09/29/2011   Procedure: TOTAL KNEE ARTHROPLASTY;  Surgeon: Loanne Drilling;  Location: [facility];  Service: Orthopedics;  Laterality: Left;   TUBAL LIGATION  1981   Social History   Occupational History   Not on file  Tobacco Use   Smoking status: Never   Smokeless tobacco: Never  Vaping Use   Vaping Use: Never used  Substance and Sexual Activity   Alcohol use: No   Drug use: No   Sexual activity: Not on file

## 2022-01-01 NOTE — Progress Notes (Addendum)
COVID swab appointment: 01-13-22 @ 9:45 AM ? ?COVID Vaccine Completed:  Yes x2 ?Date COVID Vaccine completed: ?Has received booster:  Yes x1 ?COVID vaccine manufacturer: Pfizer     ? ?Date of COVID positive in last 90 days:  No ? ?PCP - Janie Morning, DO ?Cardiologist - Adrian Prows, MD ? ?Cardiac clearance in Epic dated 12-26-21 by Lawerance Cruel, PA-C ? ?Chest x-ray - N/A ?EKG - 11-13-21 Epic ?Stress Test - 12-10-21 Epic ?ECHO - 12-10-21 Epic ?Cardiac Cath - N/A ?Pacemaker/ICD device last checked: ?Spinal Cord Stimulator: ? ?Bowel Prep - N/A ? ?Sleep Study - N/A ?CPAP -  ? ?Fasting Blood Sugar - N/A ?Checks Blood Sugar _____ times a day ? ?Blood Thinner Instructions: ?Aspirin Instructions:  ASA 81 mg. Per patient she is to stop a week before ?Last Dose: ? ?Activity level:   Can go up a flight of stairs and perform activities of daily living without stopping and without symptoms of chest pain or shortness of breath.  Patient states that she does have shortness of breath with exertion at times, this is not a new issues and it has not worsened.     ? ?Anesthesia review:  PSVT, history of palpitations, poor R wave progression on EKG ? ?Systolic BP elevated at PAT but patient states that she has been rushing to get to her appointment.   ? ?Patient denies shortness of breath, fever, cough and chest pain at PAT appointment ? ? ?Patient verbalized understanding of instructions that were given to them at the PAT appointment. Patient was also instructed that they will need to review over the PAT instructions again at home before surgery.  ?

## 2022-01-02 ENCOUNTER — Other Ambulatory Visit: Payer: Self-pay

## 2022-01-02 ENCOUNTER — Encounter (HOSPITAL_COMMUNITY)
Admission: RE | Admit: 2022-01-02 | Discharge: 2022-01-02 | Disposition: A | Discharge status: Home / Self Care | Payer: Medicare Other | Source: Ambulatory Visit | Attending: Orthopedic Surgery | Admitting: Orthopedic Surgery

## 2022-01-02 ENCOUNTER — Encounter (HOSPITAL_COMMUNITY): Payer: Self-pay

## 2022-01-02 VITALS — BP 163/81 | HR 50 | Temp 98.1°F | Resp 18 | Ht 70.0 in | Wt 227.8 lb

## 2022-01-02 DIAGNOSIS — Z79899 Other long term (current) drug therapy: Secondary | ICD-10-CM | POA: Insufficient documentation

## 2022-01-02 DIAGNOSIS — M1612 Unilateral primary osteoarthritis, left hip: Secondary | ICD-10-CM | POA: Insufficient documentation

## 2022-01-02 DIAGNOSIS — Z01812 Encounter for preprocedural laboratory examination: Secondary | ICD-10-CM | POA: Diagnosis not present

## 2022-01-02 DIAGNOSIS — Z01818 Encounter for other preprocedural examination: Secondary | ICD-10-CM

## 2022-01-02 HISTORY — DX: Dyspnea, unspecified: R06.00

## 2022-01-02 LAB — CBC
HCT: 44.1 % (ref 36.0–46.0)
Hemoglobin: 14.4 g/dL (ref 12.0–15.0)
MCH: 29.3 pg (ref 26.0–34.0)
MCHC: 32.7 g/dL (ref 30.0–36.0)
MCV: 89.6 fL (ref 80.0–100.0)
Platelets: 226 10*3/uL (ref 150–400)
RBC: 4.92 MIL/uL (ref 3.87–5.11)
RDW: 14.4 % (ref 11.5–15.5)
WBC: 3.4 10*3/uL — ABNORMAL LOW (ref 4.0–10.5)
nRBC: 0 % (ref 0.0–0.2)

## 2022-01-02 LAB — TYPE AND SCREEN
ABO/RH(D): O NEG
Antibody Screen: NEGATIVE

## 2022-01-02 LAB — COMPREHENSIVE METABOLIC PANEL
ALT: 44 U/L (ref 0–44)
AST: 45 U/L — ABNORMAL HIGH (ref 15–41)
Albumin: 4.3 g/dL (ref 3.5–5.0)
Alkaline Phosphatase: 88 U/L (ref 38–126)
Anion gap: 6 (ref 5–15)
BUN: 16 mg/dL (ref 8–23)
CO2: 27 mmol/L (ref 22–32)
Calcium: 9.3 mg/dL (ref 8.9–10.3)
Chloride: 105 mmol/L (ref 98–111)
Creatinine, Ser: 0.51 mg/dL (ref 0.44–1.00)
GFR, Estimated: >60 mL/min (ref >60)
Glucose, Bld: 87 mg/dL (ref 70–99)
Potassium: 3.8 mmol/L (ref 3.5–5.1)
Sodium: 138 mmol/L (ref 135–145)
Total Bilirubin: 1 mg/dL (ref 0.3–1.2)
Total Protein: 7.4 g/dL (ref 6.5–8.1)

## 2022-01-02 LAB — SURGICAL PCR SCREEN
MRSA, PCR: NEGATIVE
Staphylococcus aureus: NEGATIVE

## 2022-01-02 LAB — PROTIME-INR
INR: 1 (ref 0.8–1.2)
Prothrombin Time: 13.2 seconds (ref 11.4–15.2)

## 2022-01-13 ENCOUNTER — Other Ambulatory Visit: Payer: Self-pay

## 2022-01-13 ENCOUNTER — Encounter (HOSPITAL_COMMUNITY)
Admission: RE | Admit: 2022-01-13 | Discharge: 2022-01-13 | Disposition: A | Discharge status: Home / Self Care | Payer: Medicare Other | Source: Ambulatory Visit | Attending: Orthopedic Surgery | Admitting: Orthopedic Surgery

## 2022-01-13 DIAGNOSIS — Z20822 Contact with and (suspected) exposure to covid-19: Secondary | ICD-10-CM | POA: Insufficient documentation

## 2022-01-13 DIAGNOSIS — Z01812 Encounter for preprocedural laboratory examination: Secondary | ICD-10-CM | POA: Insufficient documentation

## 2022-01-13 LAB — SARS CORONAVIRUS 2 (TAT 6-24 HRS): SARS Coronavirus 2: NEGATIVE

## 2022-01-15 ENCOUNTER — Observation Stay (HOSPITAL_COMMUNITY)
Admission: RE | Admit: 2022-01-15 | Discharge: 2022-01-17 | Disposition: A | Discharge status: Home / Self Care | Payer: Medicare Other | Source: Ambulatory Visit | Attending: Orthopedic Surgery | Admitting: Orthopedic Surgery

## 2022-01-15 ENCOUNTER — Ambulatory Visit (HOSPITAL_COMMUNITY): Payer: Medicare Other | Admitting: Physician Assistant

## 2022-01-15 ENCOUNTER — Ambulatory Visit (HOSPITAL_BASED_OUTPATIENT_CLINIC_OR_DEPARTMENT_OTHER): Payer: Medicare Other | Admitting: Anesthesiology

## 2022-01-15 ENCOUNTER — Other Ambulatory Visit: Payer: Self-pay

## 2022-01-15 ENCOUNTER — Ambulatory Visit (HOSPITAL_COMMUNITY): Payer: Medicare Other

## 2022-01-15 ENCOUNTER — Encounter (HOSPITAL_COMMUNITY)
Admission: RE | Disposition: A | Discharge status: Home / Self Care | Payer: Self-pay | Source: Ambulatory Visit | Attending: Orthopedic Surgery

## 2022-01-15 ENCOUNTER — Observation Stay (HOSPITAL_COMMUNITY): Payer: Medicare Other

## 2022-01-15 ENCOUNTER — Encounter (HOSPITAL_COMMUNITY): Payer: Self-pay | Admitting: Orthopedic Surgery

## 2022-01-15 DIAGNOSIS — Z96641 Presence of right artificial hip joint: Secondary | ICD-10-CM | POA: Insufficient documentation

## 2022-01-15 DIAGNOSIS — Z7982 Long term (current) use of aspirin: Secondary | ICD-10-CM | POA: Diagnosis not present

## 2022-01-15 DIAGNOSIS — M1612 Unilateral primary osteoarthritis, left hip: Principal | ICD-10-CM | POA: Insufficient documentation

## 2022-01-15 DIAGNOSIS — Z96653 Presence of artificial knee joint, bilateral: Secondary | ICD-10-CM | POA: Insufficient documentation

## 2022-01-15 DIAGNOSIS — Z79899 Other long term (current) drug therapy: Secondary | ICD-10-CM | POA: Insufficient documentation

## 2022-01-15 DIAGNOSIS — M169 Osteoarthritis of hip, unspecified: Secondary | ICD-10-CM | POA: Diagnosis present

## 2022-01-15 DIAGNOSIS — Z96649 Presence of unspecified artificial hip joint: Secondary | ICD-10-CM

## 2022-01-15 DIAGNOSIS — Z419 Encounter for procedure for purposes other than remedying health state, unspecified: Secondary | ICD-10-CM

## 2022-01-15 HISTORY — PX: TOTAL HIP ARTHROPLASTY: SHX124

## 2022-01-15 SURGERY — ARTHROPLASTY, HIP, TOTAL, ANTERIOR APPROACH
Anesthesia: Spinal | Site: Hip | Laterality: Left

## 2022-01-15 MED ORDER — ACETAMINOPHEN 325 MG PO TABS
325.0000 mg | ORAL_TABLET | Freq: Four times a day (QID) | ORAL | Status: DC | PRN
  Administered 2022-01-15: 650 mg via ORAL

## 2022-01-15 MED ORDER — PHENOL 1.4 % MT LIQD: 1.0000 | OROMUCOSAL | Status: DC | PRN

## 2022-01-15 MED ORDER — ACETAMINOPHEN 10 MG/ML IV SOLN
1000.0000 mg | Freq: Four times a day (QID) | INTRAVENOUS | Status: DC
  Administered 2022-01-15: 1000 mg via INTRAVENOUS
  Filled 2022-01-15: qty 100

## 2022-01-15 MED ORDER — DEXAMETHASONE SODIUM PHOSPHATE 10 MG/ML IJ SOLN
INTRAMUSCULAR | Status: AC
  Filled 2022-01-15: qty 2

## 2022-01-15 MED ORDER — CHLORHEXIDINE GLUCONATE 0.12 % MT SOLN
15.0000 mL | Freq: Once | OROMUCOSAL | Status: AC
  Administered 2022-01-15: 15 mL via OROMUCOSAL

## 2022-01-15 MED ORDER — ONDANSETRON HCL 4 MG/2ML IJ SOLN
INTRAMUSCULAR | Status: DC | PRN
  Administered 2022-01-15: 4 mg via INTRAVENOUS

## 2022-01-15 MED ORDER — METHOCARBAMOL 1000 MG/10ML IJ SOLN
500.0000 mg | Freq: Four times a day (QID) | INTRAVENOUS | Status: DC | PRN
  Filled 2022-01-15: qty 5

## 2022-01-15 MED ORDER — POLYVINYL ALCOHOL 1.4 % OP SOLN
1.0000 [drp] | Freq: Three times a day (TID) | OPHTHALMIC | Status: DC
  Administered 2022-01-15 – 2022-01-17 (×6): 1 [drp] via OPHTHALMIC
  Filled 2022-01-15: qty 15

## 2022-01-15 MED ORDER — POVIDONE-IODINE 10 % EX SWAB
2.0000 "application " | Freq: Once | CUTANEOUS | Status: AC
  Administered 2022-01-15: 2 via TOPICAL

## 2022-01-15 MED ORDER — WATER FOR IRRIGATION, STERILE IR SOLN
Status: DC | PRN
  Administered 2022-01-15: 2000 mL

## 2022-01-15 MED ORDER — MENTHOL 3 MG MT LOZG: 1.0000 | LOZENGE | OROMUCOSAL | Status: DC | PRN

## 2022-01-15 MED ORDER — BISACODYL 10 MG RE SUPP: 10.0000 mg | Freq: Every day | RECTAL | Status: DC | PRN

## 2022-01-15 MED ORDER — BUPIVACAINE IN DEXTROSE 0.75-8.25 % IT SOLN
INTRATHECAL | Status: DC | PRN
  Administered 2022-01-15: 1.8 mL via INTRATHECAL

## 2022-01-15 MED ORDER — PHENYLEPHRINE 40 MCG/ML (10ML) SYRINGE FOR IV PUSH (FOR BLOOD PRESSURE SUPPORT)
PREFILLED_SYRINGE | INTRAVENOUS | Status: DC | PRN
  Administered 2022-01-15: 80 ug via INTRAVENOUS
  Administered 2022-01-15: 160 ug via INTRAVENOUS

## 2022-01-15 MED ORDER — SODIUM CHLORIDE (HYPERTONIC) 2 % OP SOLN
1.0000 [drp] | Freq: Two times a day (BID) | OPHTHALMIC | Status: DC | PRN
  Administered 2022-01-15 – 2022-01-16 (×2): 1 [drp] via OPHTHALMIC
  Filled 2022-01-15 (×2): qty 15

## 2022-01-15 MED ORDER — 0.9 % SODIUM CHLORIDE (POUR BTL) OPTIME
TOPICAL | Status: DC | PRN
Start: 2022-01-15 — End: 2022-01-15
  Administered 2022-01-15: 1000 mL

## 2022-01-15 MED ORDER — METOCLOPRAMIDE HCL 5 MG PO TABS: 5.0000 mg | ORAL_TABLET | Freq: Three times a day (TID) | ORAL | Status: DC | PRN

## 2022-01-15 MED ORDER — ACETAMINOPHEN 325 MG PO TABS
325.0000 mg | ORAL_TABLET | Freq: Four times a day (QID) | ORAL | Status: DC | PRN
  Filled 2022-01-15: qty 2

## 2022-01-15 MED ORDER — ONDANSETRON HCL 4 MG PO TABS: 4.0000 mg | ORAL_TABLET | Freq: Four times a day (QID) | ORAL | Status: DC | PRN

## 2022-01-15 MED ORDER — RIVAROXABAN 10 MG PO TABS
10.0000 mg | ORAL_TABLET | Freq: Every day | ORAL | Status: DC
  Administered 2022-01-16 – 2022-01-17 (×2): 10 mg via ORAL
  Filled 2022-01-15 (×2): qty 1

## 2022-01-15 MED ORDER — ATORVASTATIN CALCIUM 40 MG PO TABS
40.0000 mg | ORAL_TABLET | Freq: Every day | ORAL | Status: DC
Start: 2022-01-16 — End: 2022-01-17
  Administered 2022-01-16 – 2022-01-17 (×2): 40 mg via ORAL
  Filled 2022-01-15 (×2): qty 1

## 2022-01-15 MED ORDER — TRAMADOL HCL 50 MG PO TABS
50.0000 mg | ORAL_TABLET | Freq: Four times a day (QID) | ORAL | Status: DC | PRN
  Administered 2022-01-16 (×2): 50 mg via ORAL
  Filled 2022-01-15 (×2): qty 1

## 2022-01-15 MED ORDER — DOCUSATE SODIUM 100 MG PO CAPS
100.0000 mg | ORAL_CAPSULE | Freq: Two times a day (BID) | ORAL | Status: DC
  Administered 2022-01-15 – 2022-01-17 (×4): 100 mg via ORAL
  Filled 2022-01-15 (×4): qty 1

## 2022-01-15 MED ORDER — METHOCARBAMOL 500 MG PO TABS
500.0000 mg | ORAL_TABLET | Freq: Four times a day (QID) | ORAL | Status: DC | PRN
Start: 2022-01-15 — End: 2022-01-17
  Administered 2022-01-15 – 2022-01-16 (×4): 500 mg via ORAL
  Filled 2022-01-15 (×5): qty 1

## 2022-01-15 MED ORDER — PROPOFOL 500 MG/50ML IV EMUL
INTRAVENOUS | Status: DC | PRN
  Administered 2022-01-15: 75 ug/kg/min via INTRAVENOUS

## 2022-01-15 MED ORDER — PROPOFOL 1000 MG/100ML IV EMUL
INTRAVENOUS | Status: AC
  Filled 2022-01-15: qty 200

## 2022-01-15 MED ORDER — CEFAZOLIN SODIUM-DEXTROSE 2-4 GM/100ML-% IV SOLN
2.0000 g | INTRAVENOUS | Status: AC
  Administered 2022-01-15: 2 g via INTRAVENOUS
  Filled 2022-01-15: qty 100

## 2022-01-15 MED ORDER — BUPIVACAINE-EPINEPHRINE (PF) 0.25% -1:200000 IJ SOLN
INTRAMUSCULAR | Status: AC
  Filled 2022-01-15: qty 30

## 2022-01-15 MED ORDER — PROPOFOL 10 MG/ML IV BOLUS
INTRAVENOUS | Status: DC | PRN
  Administered 2022-01-15 (×2): 15 mg via INTRAVENOUS

## 2022-01-15 MED ORDER — CEFAZOLIN SODIUM-DEXTROSE 2-4 GM/100ML-% IV SOLN
2.0000 g | Freq: Four times a day (QID) | INTRAVENOUS | Status: AC
  Administered 2022-01-15 (×2): 2 g via INTRAVENOUS
  Filled 2022-01-15 (×2): qty 100

## 2022-01-15 MED ORDER — AMLODIPINE BESYLATE 5 MG PO TABS
2.5000 mg | ORAL_TABLET | Freq: Every day | ORAL | Status: DC
  Administered 2022-01-16 – 2022-01-17 (×2): 2.5 mg via ORAL
  Filled 2022-01-15 (×2): qty 1

## 2022-01-15 MED ORDER — PHENYLEPHRINE HCL-NACL 20-0.9 MG/250ML-% IV SOLN
INTRAVENOUS | Status: DC | PRN
  Administered 2022-01-15: 40 ug/min via INTRAVENOUS

## 2022-01-15 MED ORDER — SODIUM CHLORIDE 0.9 % IV SOLN: INTRAVENOUS | Status: DC

## 2022-01-15 MED ORDER — POLYETHYLENE GLYCOL 3350 17 G PO PACK: 17.0000 g | PACK | Freq: Every day | ORAL | Status: DC | PRN

## 2022-01-15 MED ORDER — LACTATED RINGERS IV SOLN: INTRAVENOUS | Status: DC

## 2022-01-15 MED ORDER — METOPROLOL SUCCINATE ER 50 MG PO TB24
50.0000 mg | ORAL_TABLET | Freq: Every day | ORAL | Status: DC
Start: 2022-01-16 — End: 2022-01-17
  Administered 2022-01-16 – 2022-01-17 (×2): 50 mg via ORAL
  Filled 2022-01-15 (×2): qty 1

## 2022-01-15 MED ORDER — BUPIVACAINE-EPINEPHRINE (PF) 0.25% -1:200000 IJ SOLN
INTRAMUSCULAR | Status: DC | PRN
  Administered 2022-01-15: 30 mL

## 2022-01-15 MED ORDER — MORPHINE SULFATE (PF) 2 MG/ML IV SOLN: 0.5000 mg | INTRAVENOUS | Status: DC | PRN

## 2022-01-15 MED ORDER — HYDROCODONE-ACETAMINOPHEN 5-325 MG PO TABS
1.0000 | ORAL_TABLET | ORAL | Status: DC | PRN
  Administered 2022-01-15 – 2022-01-17 (×4): 2 via ORAL
  Filled 2022-01-15 (×4): qty 2

## 2022-01-15 MED ORDER — HYDROCODONE-ACETAMINOPHEN 7.5-325 MG PO TABS
1.0000 | ORAL_TABLET | ORAL | Status: DC | PRN
  Filled 2022-01-15: qty 1

## 2022-01-15 MED ORDER — DEXAMETHASONE SODIUM PHOSPHATE 10 MG/ML IJ SOLN
8.0000 mg | Freq: Once | INTRAMUSCULAR | Status: AC
  Administered 2022-01-15: 8 mg via INTRAVENOUS

## 2022-01-15 MED ORDER — TRANEXAMIC ACID-NACL 1000-0.7 MG/100ML-% IV SOLN
1000.0000 mg | INTRAVENOUS | Status: AC
  Administered 2022-01-15: 1000 mg via INTRAVENOUS
  Filled 2022-01-15: qty 100

## 2022-01-15 MED ORDER — PHENYLEPHRINE HCL-NACL 20-0.9 MG/250ML-% IV SOLN
INTRAVENOUS | Status: AC
  Filled 2022-01-15: qty 250

## 2022-01-15 MED ORDER — DEXAMETHASONE SODIUM PHOSPHATE 10 MG/ML IJ SOLN
10.0000 mg | Freq: Once | INTRAMUSCULAR | Status: AC
  Administered 2022-01-16: 10 mg via INTRAVENOUS
  Filled 2022-01-15: qty 1

## 2022-01-15 MED ORDER — ONDANSETRON HCL 4 MG/2ML IJ SOLN: 4.0000 mg | Freq: Four times a day (QID) | INTRAMUSCULAR | Status: DC | PRN

## 2022-01-15 MED ORDER — ORAL CARE MOUTH RINSE: 15.0000 mL | Freq: Once | OROMUCOSAL | Status: AC

## 2022-01-15 MED ORDER — ONDANSETRON HCL 4 MG/2ML IJ SOLN
INTRAMUSCULAR | Status: AC
  Filled 2022-01-15: qty 4

## 2022-01-15 MED ORDER — METOCLOPRAMIDE HCL 5 MG/ML IJ SOLN: 5.0000 mg | Freq: Three times a day (TID) | INTRAMUSCULAR | Status: DC | PRN

## 2022-01-15 SURGICAL SUPPLY — 46 items
BAG COUNTER SPONGE SURGICOUNT (BAG) ×1 IMPLANT
BAG DECANTER FOR FLEXI CONT (MISCELLANEOUS) IMPLANT
BAG SPEC THK2 15X12 ZIP CLS (MISCELLANEOUS) ×1
BAG SPNG CNTER NS LX DISP (BAG) ×1
BAG ZIPLOCK 12X15 (MISCELLANEOUS) ×1 IMPLANT
BLADE SAG 18X100X1.27 (BLADE) ×3 IMPLANT
COVER PERINEAL POST (MISCELLANEOUS) ×3 IMPLANT
COVER SURGICAL LIGHT HANDLE (MISCELLANEOUS) ×3 IMPLANT
CUP ACET PINNACLE SECTR 50MM (Hips) IMPLANT
DRAPE FOOT SWITCH (DRAPES) ×3 IMPLANT
DRAPE STERI IOBAN 125X83 (DRAPES) ×3 IMPLANT
DRAPE U-SHAPE 47X51 STRL (DRAPES) ×6 IMPLANT
DRSG AQUACEL AG ADV 3.5X10 (GAUZE/BANDAGES/DRESSINGS) ×3 IMPLANT
DURAPREP 26ML APPLICATOR (WOUND CARE) ×3 IMPLANT
ELECT REM PT RETURN 15FT ADLT (MISCELLANEOUS) ×3 IMPLANT
GLOVE SRG 8 PF TXTR STRL LF DI (GLOVE) ×2 IMPLANT
GLOVE SURG ENC MOIS LTX SZ6.5 (GLOVE) ×3 IMPLANT
GLOVE SURG ENC MOIS LTX SZ7 (GLOVE) ×3 IMPLANT
GLOVE SURG ENC MOIS LTX SZ8 (GLOVE) ×6 IMPLANT
GLOVE SURG UNDER POLY LF SZ7 (GLOVE) ×3 IMPLANT
GLOVE SURG UNDER POLY LF SZ8 (GLOVE) ×2
GLOVE SURG UNDER POLY LF SZ8.5 (GLOVE) IMPLANT
GOWN STRL REUS W/ TWL LRG LVL3 (GOWN DISPOSABLE) ×4 IMPLANT
GOWN STRL REUS W/TWL LRG LVL3 (GOWN DISPOSABLE) ×4
HEAD FEM STD 32X+1 STRL (Hips) ×1 IMPLANT
HOLDER FOLEY CATH W/STRAP (MISCELLANEOUS) ×3 IMPLANT
KIT TURNOVER KIT A (KITS) IMPLANT
LINER MARATHON 32 50 (Hips) ×1 IMPLANT
MANIFOLD NEPTUNE II (INSTRUMENTS) ×3 IMPLANT
PACK ANTERIOR HIP CUSTOM (KITS) ×3 IMPLANT
PENCIL SMOKE EVACUATOR COATED (MISCELLANEOUS) ×3 IMPLANT
PINNACLE SECTOR CUP 50MM (Hips) ×2 IMPLANT
SPIKE FLUID TRANSFER (MISCELLANEOUS) ×3 IMPLANT
SPONGE T-LAP 18X18 ~~LOC~~+RFID (SPONGE) ×6 IMPLANT
STEM FEMORAL SZ5 HIGH ACTIS (Stem) ×1 IMPLANT
STRIP CLOSURE SKIN 1/2X4 (GAUZE/BANDAGES/DRESSINGS) ×4 IMPLANT
SUT ETHIBOND NAB CT1 #1 30IN (SUTURE) ×3 IMPLANT
SUT MNCRL AB 4-0 PS2 18 (SUTURE) ×4 IMPLANT
SUT STRATAFIX 0 PDS 27 VIOLET (SUTURE) ×2
SUT VIC AB 2-0 CT1 27 (SUTURE) ×4
SUT VIC AB 2-0 CT1 TAPERPNT 27 (SUTURE) ×4 IMPLANT
SUTURE STRATFX 0 PDS 27 VIOLET (SUTURE) ×2 IMPLANT
SYR 50ML LL SCALE MARK (SYRINGE) IMPLANT
TRAY FOLEY MTR SLVR 14FR STAT (SET/KITS/TRAYS/PACK) ×1 IMPLANT
TRAY FOLEY MTR SLVR 16FR STAT (SET/KITS/TRAYS/PACK) ×2 IMPLANT
TUBE SUCTION HIGH CAP CLEAR NV (SUCTIONS) ×3 IMPLANT

## 2022-01-15 NOTE — Care Plan (Signed)
Ortho Bundle Case Management Note ? ?Patient Details  ?Name: Christina Duarte ?MRN: [id number] ?Date of Birth: 1945/12/12 ? ?                ?L THA on 01-15-22 DCP: home with dtr DME: RW ordered through [employer] PT: HEP ? ? ?DME Arranged:  Walker rolling ?DME Agency:  [employer] ? ?HH Arranged:    ?HH Agency:    ? ?Additional Comments: ?Please contact me with any questions of if this plan should need to change. ? ?Collene Schlichter  [phone] ?01/15/2022, 9:07 AM ?  ?

## 2022-01-15 NOTE — Evaluation (Signed)
Physical Therapy Evaluation ?Patient Details ?Name: Christina Duarte ?MRN: [id number] ?DOB: [birth year] ?Today's Date: 01/15/2022 ? ?History of Present Illness ? 76 yo female s/p L DA THA. PMH: R DA THA,  TKA, PVCs, arthritis  ?Clinical Impression ? Pt is s/p THA resulting in the deficits listed below (see PT Problem List).  ?Pt amb ~ 200' with RW and min assist to min/guard for safety. Anticipate steady progress in acute setting. ? Pt will benefit from skilled PT to increase their independence and safety with mobility to allow discharge to the venue listed below.  ?   ?   ? ?Recommendations for follow up therapy are one component of a multi-disciplinary discharge planning process, led by the attending physician.  Recommendations may be updated based on patient status, additional functional criteria and insurance authorization. ? ?Follow Up Recommendations Follow physician's recommendations for discharge plan and follow up therapies ? ?  ?Assistance Recommended at Discharge Intermittent Supervision/Assistance  ?Patient can return home with the following ? Help with stairs or ramp for entrance;Assistance with cooking/housework;Assist for transportation ? ?  ?Equipment Recommendations Rolling walker (2 wheels)  ?Recommendations for Other Services ?    ?  ?Functional Status Assessment Patient has had a recent decline in their functional status and demonstrates the ability to make significant improvements in function in a reasonable and predictable amount of time.  ? ?  ?Precautions / Restrictions Precautions ?Precautions: Fall ?Restrictions ?Weight Bearing Restrictions: No ?Other Position/Activity Restrictions: WBAT  ? ?  ? ?Mobility ? Bed Mobility ?Overal bed mobility: Needs Assistance ?Bed Mobility: Supine to Sit, Sit to Supine ?  ?  ?Supine to sit: Min assist ?Sit to supine: Min assist, HOB elevated ?  ?General bed mobility comments: assist with LLE on and off bed, incr time ?  ? ?Transfers ?Overall transfer level: Needs  assistance ?Equipment used: Rolling walker (2 wheels) ?Transfers: Sit to/from Stand ?Sit to Stand: Min guard ?  ?  ?  ?  ?  ?General transfer comment: cues for hand placement and control of descent ?  ? ?Ambulation/Gait ?Ambulation/Gait assistance: Min guard ?Gait Distance (Feet): 200 Feet ?Assistive device: Rolling walker (2 wheels) ?Gait Pattern/deviations: Step-to pattern, Step-through pattern, Decreased stance time - left ?  ?  ?  ?General Gait Details: cues for initial sequence, progression to step through ? ?Stairs ?  ?  ?  ?  ?  ? ?Wheelchair Mobility ?  ? ?Modified Rankin (Stroke Patients Only) ?  ? ?  ? ?Balance   ?  ?  ?  ?  ?  ?  ?  ?  ?  ?  ?  ?  ?  ?  ?  ?  ?  ?  ?   ? ? ? ?Pertinent Vitals/Pain Pain Assessment ?Pain Assessment: 0-10 ?Pain Score: 4  ?Pain Descriptors / Indicators: Discomfort ?Pain Intervention(s): Premedicated before session, Monitored during session, Limited activity within patient's tolerance  ? ? ?Home Living Family/patient expects to be discharged to:: Private residence ?Living Arrangements: Other relatives ?  ?Type of Home: House ?Home Access: Level entry ?  ?  ?Alternate Level Stairs-Number of Steps: 6, landing then 7 more ?Home Layout: Two level;Bed/bath upstairs ?Home Equipment: Cane - single point;BSC/3in1 ?Additional Comments: pt plans to d/c to her dtr's home (aka "[facility]")  ?  ?Prior Function Prior Level of Function : Independent/Modified Independent ?  ?  ?  ?  ?  ?  ?  ?  ?  ? ? ?Hand  Dominance  ?   ? ?  ?Extremity/Trunk Assessment  ? Upper Extremity Assessment ?Upper Extremity Assessment: Overall WFL for tasks assessed ?  ? ?Lower Extremity Assessment ?Lower Extremity Assessment: LLE deficits/detail ?LLE Deficits / Details: ankle WFL. knee and hip grossly 2+/5, limited by post op pain and weakness ?  ? ?   ?Communication  ? Communication: No difficulties  ?Cognition Arousal/Alertness: Awake/alert ?Behavior During Therapy: [facility] for tasks assessed/performed ?Overall  Cognitive Status: Within Functional Limits for tasks assessed ?  ?  ?  ?  ?  ?  ?  ?  ?  ?  ?  ?  ?  ?  ?  ?  ?  ?  ?  ? ?  ?General Comments   ? ?  ?Exercises Total Joint Exercises ?Ankle Circles/Pumps: AROM, Both, 10 reps  ? ?Assessment/Plan  ?  ?PT Assessment Patient needs continued PT services  ?PT Problem List Decreased strength;Decreased mobility;Decreased activity tolerance;Pain;Decreased knowledge of use of DME;Decreased range of motion ? ?   ?  ?PT Treatment Interventions DME instruction;Therapeutic exercise;Gait training;Stair training;Functional mobility training;Therapeutic activities;Patient/family education   ? ?PT Goals (Current goals can be found in the Care Plan section)  ?Acute Rehab PT Goals ?Patient Stated Goal: go to [place]! ?PT Goal Formulation: With patient ?Time For Goal Achievement: 01/22/22 ?Potential to Achieve Goals: Good ? ?  ?Frequency 7X/week ?  ? ? ?Co-evaluation   ?  ?  ?  ?  ? ? ?  ?AM-PAC PT "6 Clicks" Mobility  ?Outcome Measure Help needed turning from your back to your side while in a flat bed without using bedrails?: A Little ?Help needed moving from lying on your back to sitting on the side of a flat bed without using bedrails?: A Little ?Help needed moving to and from a bed to a chair (including a wheelchair)?: A Little ?Help needed standing up from a chair using your arms (e.g., wheelchair or bedside chair)?: A Little ?Help needed to walk in hospital room?: A Little ?Help needed climbing 3-5 steps with a railing? : A Little ?6 Click Score: 18 ? ?  ?End of Session Equipment Utilized During Treatment: Gait belt ?Activity Tolerance: Patient tolerated treatment well ?Patient left: with call bell/phone within reach;in bed;with bed alarm set ?Nurse Communication: Mobility status ?PT Visit Diagnosis: Other abnormalities of gait and mobility (R26.89);Difficulty in walking, not elsewhere classified (R26.2) ?  ? ?Time: 7253-6644 ?PT Time Calculation (min) (ACUTE ONLY): 24  min ? ? ?Charges:   PT Evaluation ?$PT Eval Low Complexity: 1 Low ?PT Treatments ?$Gait Training: 8-22 mins ?  ?   ? ? ?Delice Bison, PT ? ?Acute Rehab Dept [facility]) [phone] ?Pager [phone] ? ?01/15/2022 ? ? ?Cyprian Gongaware ?01/15/2022, 4:35 PM ? ?

## 2022-01-15 NOTE — Anesthesia Postprocedure Evaluation (Signed)
Anesthesia Post Note ? ?Patient: MALAYIA SPIZZIRRI ? ?Procedure(s) Performed: TOTAL HIP ARTHROPLASTY ANTERIOR APPROACH (Left: Hip) ? ?  ? ?Patient location during evaluation: PACU ?Anesthesia Type: Spinal ?Level of consciousness: awake and alert ?Pain management: pain level controlled ?Vital Signs Assessment: post-procedure vital signs reviewed and stable ?Respiratory status: spontaneous breathing and respiratory function stable ?Cardiovascular status: blood pressure returned to baseline and stable ?Postop Assessment: spinal receding ?Anesthetic complications: no ? ? ?No notable events documented. ? ?Last Vitals:  ?Vitals:  ? 01/15/22 1200 01/15/22 1316  ?BP: (!) 115/52 137/73  ?Pulse: (!) 56 (!) 46  ?Resp: (!) 9 16  ?Temp:  36.9 ?C  ?SpO2: 99% 100%  ?  ?Last Pain:  ?Vitals:  ? 01/15/22 1316  ?TempSrc: Oral  ?PainSc:   ? ? ?  ?  ?  ?  ?  ?  ? ?Marcene Duos E ? ? ? ? ?

## 2022-01-15 NOTE — Anesthesia Preprocedure Evaluation (Signed)
Anesthesia Evaluation  ?Patient identified by MRN, date of birth, ID band ?Patient awake ? ? ? ?Reviewed: ?Allergy & Precautions, NPO status , Patient's Chart, lab work & pertinent test results ? ?Airway ?Mallampati: II ? ?TM Distance: >3 FB ?Neck ROM: Full ? ? ? Dental ? ?(+) Dental Advisory Given ?  ?Pulmonary ?neg pulmonary ROS,  ?  ?breath sounds clear to auscultation ? ? ? ? ? ? Cardiovascular ?hypertension, Pt. on medications and Pt. on home beta blockers ? ?Rhythm:Regular Rate:Normal ? ? ?  ?Neuro/Psych ?negative neurological ROS ?   ? GI/Hepatic ?negative GI ROS, Neg liver ROS,   ?Endo/Other  ?negative endocrine ROS ? Renal/GU ?negative Renal ROS  ? ?  ?Musculoskeletal ? ?(+) Arthritis ,  ? Abdominal ?  ?Peds ? Hematology ?negative hematology ROS ?(+)   ?Anesthesia Other Findings ? ? Reproductive/Obstetrics ? ?  ? ? ? ? ? ? ? ? ? ? ? ? ? ?  ?  ? ? ? ? ? ? ? ? ?Lab Results  ?Component Value Date  ? WBC 3.4 (L) 01/02/2022  ? HGB 14.4 01/02/2022  ? HCT 44.1 01/02/2022  ? MCV 89.6 01/02/2022  ? PLT 226 01/02/2022  ? ?Lab Results  ?Component Value Date  ? CREATININE 0.51 01/02/2022  ? BUN 16 01/02/2022  ? NA 138 01/02/2022  ? K 3.8 01/02/2022  ? CL 105 01/02/2022  ? CO2 27 01/02/2022  ? ? ?Anesthesia Physical ?Anesthesia Plan ? ?ASA: 2 ? ?Anesthesia Plan: Spinal  ? ?Post-op Pain Management: Ofirmev IV (intra-op)* and Toradol IV (intra-op)*  ? ?Induction:  ? ?PONV Risk Score and Plan: 3 and Dexamethasone, Ondansetron, Propofol infusion and Treatment may vary due to age or medical condition ? ?Airway Management Planned: Natural Airway and Simple Face Mask ? ?Additional Equipment: None ? ?Intra-op Plan:  ? ?Post-operative Plan:  ? ?Informed Consent: I have reviewed the patients History and Physical, chart, labs and discussed the procedure including the risks, benefits and alternatives for the proposed anesthesia with the patient or authorized representative who has indicated his/her  understanding and acceptance.  ? ? ? ? ? ?Plan Discussed with:  ? ?Anesthesia Plan Comments:   ? ? ? ? ? ? ?Anesthesia Quick Evaluation ? ?

## 2022-01-15 NOTE — Anesthesia Procedure Notes (Signed)
Spinal ? ?Patient location during procedure: OR ?End time: 01/15/2022 10:00 AM ?Reason for block: surgical anesthesia ?Staffing ?Performed: resident/CRNA  ?Resident/CRNA: Rosaland Lao, CRNA ?Preanesthetic Checklist ?Completed: patient identified, IV checked, site marked, risks and benefits discussed, surgical consent, monitors and equipment checked, pre-op evaluation and timeout performed ?Spinal Block ?Patient position: sitting ?Prep: DuraPrep ?Patient monitoring: heart rate, cardiac monitor, continuous pulse ox and blood pressure ?Approach: midline ?Location: L3-4 ?Injection technique: single-shot ?Needle ?Needle type: Pencan  ?Needle gauge: 24 G ?Needle length: 10 cm ?Assessment ?Sensory level: T4 ?Events: CSF return ? ? ? ?

## 2022-01-15 NOTE — Transfer of Care (Signed)
Immediate Anesthesia Transfer of Care Note ? ?Patient: Christina Duarte ? ?Procedure(s) Performed: TOTAL HIP ARTHROPLASTY ANTERIOR APPROACH (Left: Hip) ? ?Patient Location: PACU ? ?Anesthesia Type:Spinal ? ?Level of Consciousness: sedated ? ?Airway & Oxygen Therapy: Patient Spontanous Breathing and Patient connected to face mask oxygen ? ?Post-op Assessment: Report given to RN ? ?Post vital signs: Reviewed and stable ? ?Last Vitals:  ?Vitals Value Taken Time  ?BP 88/46 01/15/22 1138  ?Temp    ?Pulse 50 01/15/22 1141  ?Resp 9 01/15/22 1141  ?SpO2 97 % 01/15/22 1141  ?Vitals shown include unvalidated device data. ? ?Last Pain:  ?Vitals:  ? 01/15/22 0749  ?TempSrc: Oral  ?PainSc: 0-No pain  ?   ? ?  ? ?Complications: No notable events documented. ?

## 2022-01-15 NOTE — Discharge Instructions (Addendum)
?Christina Aluisio, MD ?Total Joint Specialist ?[facility] ?[address] ?[place], [place] [address] ?[phone] ? ?ANTERIOR APPROACH TOTAL HIP REPLACEMENT POSTOPERATIVE DIRECTIONS ? ? ? ? ?Hip Rehabilitation, Guidelines Following Surgery  ?The results of a hip operation are greatly improved after range of motion and muscle strengthening exercises. Follow all safety measures which are given to protect your hip. If any of these exercises cause increased pain or swelling in your joint, decrease the amount until you are comfortable again. Then slowly increase the exercises. Call your caregiver if you have problems or questions.  ? ?BLOOD CLOT PREVENTION ?Take a 10 mg Xarelto once a day for three weeks following surgery. Then take an 81 mg Aspirin once a day for three weeks. Then discontinue Aspirin. ?You may resume your vitamins/supplements once you have discontinued the Xarelto. ?Do not take any NSAIDs (Advil, Aleve, Ibuprofen, Meloxicam, etc.) until you have discontinued the Xarelto.  ? ?HOME CARE INSTRUCTIONS  ?Remove items at home which could result in a fall. This includes throw rugs or furniture in walking pathways.  ?ICE to the affected hip as frequently as 20-30 minutes an hour and then as needed for pain and swelling. Continue to use ice on the hip for pain and swelling from surgery. You may notice swelling that will progress down to the foot and ankle. This is normal after surgery. Elevate the leg when you are not up walking on it.   ?Continue to use the breathing machine which will help keep your temperature down.  It is common for your temperature to cycle up and down following surgery, especially at night when you are not up moving around and exerting yourself.  The breathing machine keeps your lungs expanded and your temperature down. ? ?DIET ?You may resume your previous home diet once your are discharged from the hospital. ? ?DRESSING / WOUND CARE / SHOWERING ?You have an  adhesive waterproof bandage over the incision. Leave this in place until your first follow-up appointment. Once you remove this you will not need to place another bandage.  ?You may begin showering 3 days following surgery, but do not submerge the incision under water. ? ?ACTIVITY ?For the first 3-5 days, it is important to rest and keep the operative leg elevated. You should, as a general rule, rest for 50 minutes and walk/stretch for 10 minutes per hour. After 5 days, you may slowly increase activity as tolerated.  ?Perform the exercises you were provided twice a day for about 15-20 minutes each session. Begin these 2 days following surgery. ?Walk with your walker as instructed. Use the walker until you are comfortable transitioning to a cane. Walk with the cane in the opposite hand of the operative leg. You may discontinue the cane once you are comfortable and walking steadily. ?Avoid periods of inactivity such as sitting longer than an hour when not asleep. This helps prevent blood clots.  ?Do not drive a car for 6 weeks or until released by your surgeon.  ?Do not drive while taking narcotics. ? ?TED HOSE STOCKINGS ?Wear the elastic stockings on both legs for three weeks following surgery during the day. You may remove them at night while sleeping. ? ?WEIGHT BEARING ?Weight bearing as tolerated with assist device (walker, cane, etc) as directed, use it as long as suggested by your surgeon or therapist, typically at least 4-6 weeks. ? ?POSTOPERATIVE CONSTIPATION PROTOCOL ?Constipation - defined medically as fewer than three stools per week and severe constipation as less   than one stool per week. ? ?One of the most common issues patients have following surgery is constipation.  Even if you have a regular bowel pattern at home, your normal regimen is likely to be disrupted due to multiple reasons following surgery.  Combination of anesthesia, postoperative narcotics, change in appetite and fluid intake all can  affect your bowels.  In order to avoid complications following surgery, here are some recommendations in order to help you during your recovery period. ? ?Colace (docusate) - Pick up an over-the-counter form of Colace or another stool softener and take twice a day as long as you are requiring postoperative pain medications.  Take with a full glass of water daily.  If you experience loose stools or diarrhea, hold the colace until you stool forms back up.  If your symptoms do not get better within 1 week or if they get worse, check with your doctor. ?Dulcolax (bisacodyl) - Pick up over-the-counter and take as directed by the product packaging as needed to assist with the movement of your bowels.  Take with a full glass of water.  Use this product as needed if not relieved by Colace only.  ?MiraLax (polyethylene glycol) - Pick up over-the-counter to have on hand.  MiraLax is a solution that will increase the amount of water in your bowels to assist with bowel movements.  Take as directed and can mix with a glass of water, juice, soda, coffee, or tea.  Take if you go more than two days without a movement.Do not use MiraLax more than once per day. Call your doctor if you are still constipated or irregular after using this medication for 7 days in a row. ? ?If you continue to have problems with postoperative constipation, please contact the office for further assistance and recommendations.  If you experience "the worst abdominal pain ever" or develop nausea or vomiting, please contact the office immediatly for further recommendations for treatment. ? ?ITCHING ? If you experience itching with your medications, try taking only a single pain pill, or even half a pain pill at a time.  You can also use Benadryl over the counter for itching or also to help with sleep.  ? ?MEDICATIONS ?See your medication summary on the ?After Visit Summary? that the nursing staff will review with you prior to discharge.  You may have some home  medications which will be placed on hold until you complete the course of blood thinner medication.  It is important for you to complete the blood thinner medication as prescribed by your surgeon.  Continue your approved medications as instructed at time of discharge. ? ?PRECAUTIONS ?If you experience chest pain or shortness of breath - call 911 immediately for transfer to the hospital emergency department.  ?If you develop a fever greater that 101 F, purulent drainage from wound, increased redness or drainage from wound, foul odor from the wound/dressing, or calf pain - CONTACT YOUR SURGEON.   ?                                                ?FOLLOW-UP APPOINTMENTS ?Make sure you keep all of your appointments after your operation with your surgeon and caregivers. You should call the office at the above phone number and make an appointment for approximately two weeks after the date of your surgery or on the date   instructed by your surgeon outlined in the "After Visit Summary". ? ?RANGE OF MOTION AND STRENGTHENING EXERCISES  ?These exercises are designed to help you keep full movement of your hip joint. Follow your caregiver's or physical therapist's instructions. Perform all exercises about fifteen times, three times per day or as directed. Exercise both hips, even if you have had only one joint replacement. These exercises can be done on a training (exercise) mat, on the floor, on a table or on a bed. Use whatever works the best and is most comfortable for you. Use music or television while you are exercising so that the exercises are a pleasant break in your day. This will make your life better with the exercises acting as a break in routine you can look forward to.  ?Lying on your back, slowly slide your foot toward your buttocks, raising your knee up off the floor. Then slowly slide your foot back down until your leg is straight again.  ?Lying on your back spread your legs as far apart as you can without causing  discomfort.  ?Lying on your side, raise your upper leg and foot straight up from the floor as far as is comfortable. Slowly lower the leg and repeat.  ?Lying on your back, tighten up the muscle in the fron

## 2022-01-15 NOTE — Interval H&P Note (Signed)
History and Physical Interval Note: ? ?01/15/2022 ?8:16 AM ? ?[name]  has presented today for surgery, with the diagnosis of left hip osteoarthritis.  The various methods of treatment have been discussed with the patient and family. After consideration of risks, benefits and other options for treatment, the patient has consented to  Procedure(s): ?TOTAL HIP ARTHROPLASTY ANTERIOR APPROACH (Left) as a surgical intervention.  The patient's history has been reviewed, patient examined, no change in status, stable for surgery.  I have reviewed the patient's chart and labs.  Questions were answered to the patient's satisfaction.   ? ? ?Christina Duarte Mahoganie Basher ? ? ?

## 2022-01-15 NOTE — Plan of Care (Signed)
  Problem: Activity: Goal: Risk for activity intolerance will decrease Outcome: Progressing   Problem: Pain Managment: Goal: General experience of comfort will improve Outcome: Progressing   Problem: Safety: Goal: Ability to remain free from injury will improve Outcome: Progressing   

## 2022-01-15 NOTE — Op Note (Signed)
?    OPERATIVE REPORT- TOTAL HIP ARTHROPLASTY ? ? ?PREOPERATIVE DIAGNOSIS: Osteoarthritis of the Left hip.  ? ?POSTOPERATIVE DIAGNOSIS: Osteoarthritis of the Left  hip.  ? ?PROCEDURE: Left total hip arthroplasty, anterior approach.  ? ?SURGEON: Gaynelle Arabian, MD  ? ?ASSISTANT: Theresa Duty, PA-C ? ?ANESTHESIA:  Spinal ? ?ESTIMATED BLOOD LOSS:-250 mL   ? ?DRAINS: Hemovac x1.  ? ?COMPLICATIONS: None ?  ?CONDITION: PACU - hemodynamically stable.  ? ?BRIEF CLINICAL NOTE: Christina Duarte is a 76 y.o. female who has advanced end-  ?stage arthritis of their Left  hip with progressively worsening pain and  ?dysfunction.The patient has failed nonoperative management and presents for  ?total hip arthroplasty.  ? ?PROCEDURE IN DETAIL: After successful administration of spinal  ?anesthetic, the traction boots for the [facility] bed were placed on both  ?feet and the patient was placed onto the [facility] bed, boots placed into the leg  ?holders. The Left hip was then isolated from the perineum with plastic  ?drapes and prepped and draped in the usual sterile fashion. ASIS and  ?greater trochanter were marked and a oblique incision was made, starting  ?at about 1 cm lateral and 2 cm distal to the ASIS and coursing towards  ?the anterior cortex of the femur. The skin was cut with a 10 blade  ?through subcutaneous tissue to the level of the fascia overlying the  ?tensor fascia lata muscle. The fascia was then incised in line with the  ?incision at the junction of the anterior third and posterior 2/3rd. The  ?muscle was teased off the fascia and then the interval between the TFL  ?and the rectus was developed. The Hohmann retractor was then placed at  ?the top of the femoral neck over the capsule. The vessels overlying the  ?capsule were cauterized and the fat on top of the capsule was removed.  ?A Hohmann retractor was then placed anterior underneath the rectus  ?femoris to give exposure to the entire anterior capsule. A T-shaped   ?capsulotomy was performed. The edges were tagged and the femoral head  ?was identified. ?      Osteophytes are removed off the superior acetabulum.  ?The femoral neck was then cut in situ with an oscillating saw. Traction  ?was then applied to the left lower extremity utilizing the [facility]  ?traction. The femoral head was then removed. Retractors were placed  ?around the acetabulum and then circumferential removal of the labrum was  ?performed. Osteophytes were also removed. Reaming starts at 47 mm to  ?medialize and  Increased in 2 mm increments to 49 mm. We reamed in  ?approximately 40 degrees of abduction, 20 degrees anteversion. A 50 mm  ?pinnacle acetabular shell was then impacted in anatomic position under  ?fluoroscopic guidance with excellent purchase. We did not need to place  ?any additional dome screws. A 32 mm neutral + 4 marathon liner was then  ?placed into the acetabular shell.  ?     The femoral lift was then placed along the lateral aspect of the femur  ?just distal to the vastus ridge. The leg was  externally rotated and capsule  ?was stripped off the inferior aspect of the femoral neck down to the  ?level of the lesser trochanter, this was done with electrocautery. The femur was lifted after this was performed. The  leg was then placed in an extended and adducted position essentially delivering the femur. We also removed the capsule superiorly and the piriformis from the piriformis  fossa to gain excellent exposure of the  ?proximal femur. Rongeur was used to remove some cancellous bone to get  ?into the lateral portion of the proximal femur for placement of the  ?initial starter reamer. The starter broaches was placed  the starter broach  and was shown to go down the center of the canal. Broaching  ?with the Actis system was then performed starting at size 0  coursing  ?Up to size 5. A size 5 had excellent torsional and rotational  ?and axial stability. The trial high offset neck was then placed   ?with a 32 + 1 trial head. The hip was then reduced. We confirmed that  ?the stem was in the canal both on AP and lateral x-rays. It also has excellent sizing. The hip was reduced with outstanding stability through full extension and full external rotation.. AP pelvis was taken and the leg lengths were measured and found to be equal. Hip was then dislocated again and the femoral head and neck removed. The  ?femoral broach was removed. Size 5 Actis stem with a standard offset  ?neck was then impacted into the femur following native anteversion. Has  ?excellent purchase in the canal. Excellent torsional and rotational and  ?axial stability. It is confirmed to be in the canal on AP and lateral  ?fluoroscopic views. The 32 + 1 metal head was placed and the hip  ?reduced with outstanding stability. Again AP pelvis was taken and it  ?confirmed that the leg lengths were equal. The wound was then copiously  ?irrigated with saline solution and the capsule reattached and repaired  ?with Ethibond suture. 30 ml of .25% Bupivicaine was  injected into the capsule and into the edge of the tensor fascia lata as well as subcutaneous tissue. The fascia overlying the tensor fascia lata was then closed with a running #1 V-Loc. Subcu was closed with interrupted 2-0 Vicryl and subcuticular running 4-0 Monocryl. Incision was cleaned  ?and dried. Steri-Strips and a bulky sterile dressing applied. The patient was awakened and transported to  ?recovery in stable condition.  ?      Please note that a surgical assistant was a medical necessity for this procedure to perform it in a safe and expeditious manner. Assistant was necessary to provide appropriate retraction of vital neurovascular structures and to prevent femoral fracture and allow for anatomic placement of the prosthesis. ? ?Gaynelle Arabian, M.D.  ? ? ?

## 2022-01-16 ENCOUNTER — Encounter (HOSPITAL_COMMUNITY): Payer: Self-pay | Admitting: Orthopedic Surgery

## 2022-01-16 DIAGNOSIS — M1612 Unilateral primary osteoarthritis, left hip: Secondary | ICD-10-CM | POA: Diagnosis not present

## 2022-01-16 LAB — CBC
HCT: 37.8 % (ref 36.0–46.0)
Hemoglobin: 12.4 g/dL (ref 12.0–15.0)
MCH: 29.2 pg (ref 26.0–34.0)
MCHC: 32.8 g/dL (ref 30.0–36.0)
MCV: 88.9 fL (ref 80.0–100.0)
Platelets: 219 10*3/uL (ref 150–400)
RBC: 4.25 MIL/uL (ref 3.87–5.11)
RDW: 14.5 % (ref 11.5–15.5)
WBC: 9.9 10*3/uL (ref 4.0–10.5)
nRBC: 0 % (ref 0.0–0.2)

## 2022-01-16 LAB — BASIC METABOLIC PANEL
Anion gap: 4 — ABNORMAL LOW (ref 5–15)
BUN: 16 mg/dL (ref 8–23)
CO2: 25 mmol/L (ref 22–32)
Calcium: 8.7 mg/dL — ABNORMAL LOW (ref 8.9–10.3)
Chloride: 107 mmol/L (ref 98–111)
Creatinine, Ser: 0.68 mg/dL (ref 0.44–1.00)
GFR, Estimated: >60 mL/min (ref >60)
Glucose, Bld: 125 mg/dL — ABNORMAL HIGH (ref 70–99)
Potassium: 3.4 mmol/L — ABNORMAL LOW (ref 3.5–5.1)
Sodium: 136 mmol/L (ref 135–145)

## 2022-01-16 MED ORDER — METHOCARBAMOL 500 MG PO TABS: 500.0000 mg | ORAL_TABLET | Freq: Four times a day (QID) | ORAL | 0 refills | Status: DC | PRN

## 2022-01-16 MED ORDER — TRAMADOL HCL 50 MG PO TABS: 50.0000 mg | ORAL_TABLET | Freq: Four times a day (QID) | ORAL | 0 refills | Status: DC | PRN

## 2022-01-16 MED ORDER — RIVAROXABAN 10 MG PO TABS: 10.0000 mg | ORAL_TABLET | Freq: Every day | ORAL | 0 refills | Status: DC

## 2022-01-16 MED ORDER — HYDROCODONE-ACETAMINOPHEN 5-325 MG PO TABS: 1.0000 | ORAL_TABLET | Freq: Four times a day (QID) | ORAL | 0 refills | Status: DC | PRN

## 2022-01-16 NOTE — TOC Transition Note (Signed)
Transition of Care (TOC) - CM/SW Discharge Note ? ?Patient Details  ?Name: Christina Duarte ?MRN: [id number] ?Date of Birth: 07/04/1946 ? ?Transition of Care (TOC) CM/SW Contact:  ?Megan S Glenn, LCSW ?Phone Number: ?01/16/2022, 9:30 AM ? ?Clinical Narrative: Patient is expected to discharge home after working with PT. CSW met with patient to review discharge plan and needs. Patient will go home with a home exercise program (HEP). Patient will need a rolling walker. [employer] delivered walker to patient's room. TOC signing off. ? ?Final next level of care: Home/Self Care ?Barriers to Discharge: No Barriers Identified ? ?Patient Goals and CMS Choice ?Patient states their goals for this hospitalization and ongoing recovery are:: Discharge home with HEP ?CMS Medicare.gov Compare Post Acute Care list provided to:: Patient ?Choice offered to / list presented to : Patient ? ?Discharge Plan and Services         ?DME Arranged: Walker rolling ?DME Agency: [employer] ?Representative spoke with at DME Agency: Prearranged in orthopedist's office ? ?Readmission Risk Interventions ?No flowsheet data found. ? ?

## 2022-01-16 NOTE — Progress Notes (Signed)
Physical Therapy Treatment ?Patient Details ?Name: Christina Duarte ?MRN: [id number] ?DOB: 13-Sep-1946 ?Today's Date: 01/16/2022 ? ? ?History of Present Illness 76 yo female s/p L DA THA. PMH: R DA THA,  TKA, PVCs, arthritis ? ?  ?PT Comments  ? ? The patient reports that her pain is > 10/10 after medication. Patient   ambulates very slowly. Patient with noted  decreased weight bear lon the left leg. Patient not ready to attempt steps. Will practice  next visit THIS pm. ?PATIENT REPORTS THAT SHE IS GOING TO  HER DAUGHTER'S HOME IN [place] AND HAS 13 STEPS TO GET TO BEDROOM. PATIENT STATES THAT SHE DOES NOT HAVE A PLACE TO STAY ON THE FIRST LEVEL.PATIENT WILL NEED TO DEMONSTRATE SAFE  NEGOTIATION OF  STEPS PRIOR TO DC. ?   ?Recommendations for follow up therapy are one component of a multi-disciplinary discharge planning process, led by the attending physician.  Recommendations may be updated based on patient status, additional functional criteria and insurance authorization. ? ?Follow Up Recommendations ? Follow physician's recommendations for discharge plan and follow up therapies ?  ?  ?Assistance Recommended at Discharge Intermittent Supervision/Assistance  ?Patient can return home with the following Help with stairs or ramp for entrance;Assistance with cooking/housework;Assist for transportation ?  ?Equipment Recommendations ? Rolling walker (2 wheels)  ?  ?Recommendations for Other Services   ? ? ?  ?Precautions / Restrictions Precautions ?Precautions: Fall ?Restrictions ?Other Position/Activity Restrictions: WBAT  ?  ? ?Mobility ? Bed Mobility ?  ?  ?  ?  ?  ?  ?  ?General bed mobility comments: in recliner ?  ? ?Transfers ?Overall transfer level: Needs assistance ?Equipment used: Rolling walker (2 wheels) ?Transfers: Sit to/from Stand ?Sit to Stand: Min guard ?  ?  ?  ?  ?  ?General transfer comment: cues for hand placement and control of descent ?  ? ?Ambulation/Gait ?Ambulation/Gait assistance: Min guard ?Gait  Distance (Feet): 200 Feet ?Assistive device: Rolling walker (2 wheels) ?Gait Pattern/deviations: Step-to pattern, Step-through pattern, Decreased stance time - left ?Gait velocity: decr ?  ?  ?General Gait Details: cues for initial sequence, progression to step through ? ? ?Stairs ?  ?  ?  ?  ?  ? ? ?Wheelchair Mobility ?  ? ?Modified Rankin (Stroke Patients Only) ?  ? ? ?  ?Balance Overall balance assessment: Needs assistance ?  ?  ?  ?  ?Standing balance support: During functional activity, No upper extremity supported ?Standing balance-Leahy Scale: Fair ?  ?  ?  ?  ?  ?  ?  ?  ?  ?  ?  ?  ?  ? ?  ?Cognition Arousal/Alertness: Awake/alert ?  ?  ?  ?  ?  ?  ?  ?  ?  ?  ?  ?  ?  ?  ?  ?  ?  ?  ?  ?  ?  ? ?  ?Exercises   ? ?  ?General Comments   ?  ?  ? ?Pertinent Vitals/Pain Pain Assessment ?Pain Score: 10-Worst pain ever ?Pain Location: left thigh ?Pain Descriptors / Indicators: Discomfort, Aching, Sharp ?Pain Intervention(s): Premedicated before session, Monitored during session, Ice applied, Repositioned  ? ? ?Home Living   ?  ?  ?  ?  ?  ?  ?  ?  ?  ?   ?  ?Prior Function    ?  ?  ?   ? ?PT Goals (current goals  can now be found in the care plan section) Progress towards PT goals: Progressing toward goals ? ?  ?Frequency ? ? ? 7X/week ? ? ? ?  ?PT Plan Current plan remains appropriate  ? ? ?Co-evaluation   ?  ?  ?  ?  ? ?  ?AM-PAC PT "6 Clicks" Mobility   ?Outcome Measure ? Help needed turning from your back to your side while in a flat bed without using bedrails?: A Little ?Help needed moving from lying on your back to sitting on the side of a flat bed without using bedrails?: A Little ?Help needed moving to and from a bed to a chair (including a wheelchair)?: A Little ?Help needed standing up from a chair using your arms (e.g., wheelchair or bedside chair)?: A Little ?Help needed to walk in hospital room?: A Little ?Help needed climbing 3-5 steps with a railing? : A Lot ?6 Click Score: 17 ? ?  ?End of Session  Equipment Utilized During Treatment: Gait belt ?Activity Tolerance: Patient tolerated treatment well ?Patient left: with call bell/phone within reach;in bed;with bed alarm set ?Nurse Communication: Mobility status ?PT Visit Diagnosis: Other abnormalities of gait and mobility (R26.89);Difficulty in walking, not elsewhere classified (R26.2) ?  ? ? ?Time: 6812-7517 ?PT Time Calculation (min) (ACUTE ONLY): 33 min ? ?Charges:  $Gait Training: 23-37 mins          ?          ? ?Blanchard Kelch PT ?Acute Rehabilitation Services ?Pager [phone] ?Office [phone] ? ? ? ?Eulon Allnutt, Jobe Igo ?01/16/2022, 1:03 PM ? ?

## 2022-01-16 NOTE — Plan of Care (Signed)

## 2022-01-16 NOTE — Progress Notes (Signed)
Physical Therapy Treatment ?Patient Details ?Name: Christina Duarte ?MRN: [id number] ?DOB: 1946/02/23 ?Today's Date: 01/16/2022 ? ? ?History of Present Illness 76 yo female s/p L DA THA. PMH: R DA THA,  TKA, PVCs, arthritis ? ?  ?PT Comments  ? ? Patient demonstrates improved sit to stand from recliner, less effort. Patient progressing well, will practice stairs in AM(has 13 total)Patient is going to  ?  stay with a dtr in [place],   ?Recommendations for follow up therapy are one component of a multi-disciplinary discharge planning process, led by the attending physician.  Recommendations may be updated based on patient status, additional functional criteria and insurance authorization. ? ?Follow Up Recommendations ? Follow physician's recommendations for discharge plan and follow up therapies ?  ?  ?Assistance Recommended at Discharge Intermittent Supervision/Assistance  ?Patient can return home with the following Help with stairs or ramp for entrance;Assistance with cooking/housework;Assist for transportation ?  ?Equipment Recommendations ? Rolling walker (2 wheels)  ?  ?Recommendations for Other Services   ? ? ?  ?Precautions / Restrictions Precautions ?Precautions: Fall ?Restrictions ?RLE Weight Bearing: Weight bearing as tolerated ?Other Position/Activity Restrictions: WBAT  ?  ? ?Mobility ? Bed Mobility ?  ?  ?  ?  ?  ?  ?  ?General bed mobility comments: in recliner ?  ? ?Transfers ?Overall transfer level: Needs assistance ?Equipment used: Rolling walker (2 wheels) ?Transfers: Sit to/from Stand ?Sit to Stand: Min guard ?  ?  ?  ?  ?  ?General transfer comment: improved sit to stand, less effort ?  ? ?Ambulation/Gait ?Ambulation/Gait assistance: Min guard ?Gait Distance (Feet): 200 Feet ?Assistive device: Rolling walker (2 wheels) ?Gait Pattern/deviations: Step-to pattern, Step-through pattern, Decreased stance time - left ?Gait velocity: decr ?  ?  ?General Gait Details: cues for initial sequence, progression to  step through ? ? ?Stairs ?  ?  ?  ?  ?  ? ? ?Wheelchair Mobility ?  ? ?Modified Rankin (Stroke Patients Only) ?  ? ? ?  ?Balance Overall balance assessment: Needs assistance ?  ?Sitting balance-Leahy Scale: Good ?  ?  ?Standing balance support: During functional activity, Bilateral upper extremity supported ?Standing balance-Leahy Scale: Fair ?  ?  ?  ?  ?  ?  ?  ?  ?  ?  ?  ?  ?  ? ?  ?Cognition Arousal/Alertness: Awake/alert ?  ?  ?  ?  ?  ?  ?  ?  ?  ?  ?  ?  ?  ?  ?  ?  ?  ?  ?  ?  ?  ? ?  ?Exercises Total Joint Exercises ?Ankle Circles/Pumps: AROM, Both, 10 reps ?Long Arc Quad: AROM, Left, 10 reps, Seated ? ?  ?General Comments   ?  ?  ? ?Pertinent Vitals/Pain Pain Assessment ?Pain Score: 5  ?Pain Location: left thigh ?Pain Descriptors / Indicators: Discomfort, Aching, Sharp ?Pain Intervention(s): Monitored during session, Patient requesting pain meds-RN notified, Ice applied  ? ? ?Home Living   ?  ?  ?  ?  ?  ?  ?  ?  ?  ?   ?  ?Prior Function    ?  ?  ?   ? ?PT Goals (current goals can now be found in the care plan section) Progress towards PT goals: Progressing toward goals ? ?  ?Frequency ? ? ? 7X/week ? ? ? ?  ?PT Plan Current plan remains appropriate  ? ? ?  Co-evaluation   ?  ?  ?  ?  ? ?  ?AM-PAC PT "6 Clicks" Mobility   ?Outcome Measure ? Help needed turning from your back to your side while in a flat bed without using bedrails?: A Little ?Help needed moving from lying on your back to sitting on the side of a flat bed without using bedrails?: A Little ?Help needed moving to and from a bed to a chair (including a wheelchair)?: A Little ?Help needed standing up from a chair using your arms (e.g., wheelchair or bedside chair)?: A Little ?Help needed to walk in hospital room?: A Little ?Help needed climbing 3-5 steps with a railing? : A Lot ?6 Click Score: 17 ? ?  ?End of Session Equipment Utilized During Treatment: Gait belt ?Activity Tolerance: Patient tolerated treatment well ?Patient left: with call  bell/phone within reach;in chair ?Nurse Communication: Mobility status ?PT Visit Diagnosis: Other abnormalities of gait and mobility (R26.89);Difficulty in walking, not elsewhere classified (R26.2) ?  ? ? ?Time: 1345-1410 ?PT Time Calculation (min) (ACUTE ONLY): 25 min ? ?Charges:  $Gait Training: 23-37 mins          ?          ? ?Tresa Endo PT ?Acute Rehabilitation Services ?Pager [phone] ?Office [phone] ? ? ? ?Detrice Cales, Shella Maxim ?01/16/2022, 3:29 PM ? ?

## 2022-01-16 NOTE — Progress Notes (Signed)
? ?  Subjective: ?1 Day Post-Op Procedure(s) (LRB): ?TOTAL HIP ARTHROPLASTY ANTERIOR APPROACH (Left) ?Patient reports pain as mild.   ?Patient seen in rounds by Dr. Lequita Halt. ?Patient is well, and has had no acute complaints or problems. Patient ambulated 200' with PT yesterday. We will continue therapy today. No issues overnight. Foley cath removed this morning. ? ?Objective: ?Vital signs in last 24 hours: ?Temp:  [96.4 ?F (35.8 ?C)-98.6 ?F (37 ?C)] 98 ?F (36.7 ?C) (03/16 0231) ?Pulse Rate:  [46-74] 69 (03/16 0231) ?Resp:  [9-18] 16 (03/16 0231) ?BP: (88-157)/(46-77) 150/63 (03/16 0231) ?SpO2:  [94 %-100 %] 94 % (03/16 0231) ? ?Intake/Output from previous day: ? ?Intake/Output Summary (Last 24 hours) at 01/16/2022 0802 ?Last data filed at 01/16/2022 9735 ?Gross per 24 hour  ?Intake 1669.4 ml  ?Output 4700 ml  ?Net -3030.6 ml  ?  ? ?Intake/Output this shift: ?No intake/output data recorded. ? ?Labs: ?Recent Labs  ?  01/16/22 ?0326  ?HGB 12.4  ? ?Recent Labs  ?  01/16/22 ?0326  ?WBC 9.9  ?RBC 4.25  ?HCT 37.8  ?PLT 219  ? ?Recent Labs  ?  01/16/22 ?0326  ?NA 136  ?K 3.4*  ?CL 107  ?CO2 25  ?BUN 16  ?CREATININE 0.68  ?GLUCOSE 125*  ?CALCIUM 8.7*  ? ?No results for input(s): LABPT, INR in the last 72 hours. ? ?Exam: ?General - Patient is Alert and Oriented ?Extremity - Neurologically intact ?Neurovascular intact ?Sensation intact distally ?Dorsiflexion/Plantar flexion intact ?Dressing - dressing C/D/I ?Motor Function - intact, moving foot and toes well on exam. ? ?Past Medical History:  ?Diagnosis Date  ? Arthritis   ? 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 Blood transfusion   ? Complication of anesthesia   ? woke up during colonoscopy  ? Dyspnea   ? With activity  ? H/O blood clots   ? RIGHT KNEE  ? Heart palpitations   ? PSVT (paroxysmal supraventricular tachycardia) (HCC) 03/06/2018  ? EKG at the emergency room 03/06/2018: SVT at a rate of 137 bpm,  anteroseptal infarct old.  ?  PVC's (premature ventricular contractions)   ? PVC's (premature ventricular contractions)   ? ? ?Assessment/Plan: ?1 Day Post-Op Procedure(s) (LRB): ?TOTAL HIP ARTHROPLASTY ANTERIOR APPROACH (Left) ?Principal Problem: ?  OA (osteoarthritis) of hip ?Active Problems: ?  Primary osteoarthritis of left hip ? ?Estimated body mass index is 32.68 kg/m? as calculated from the following: ?  Height as of this encounter: 5\' 10"  (1.778 m). ?  Weight as of this encounter: 103.3 kg. ?Advance diet ?Up with therapy ?D/C IV fluids ? ?DVT Prophylaxis - Xarelto ?Weight bearing as tolerated. ?Continue therapy. ? ?Plan is to go Home with daughter after hospital stay. Daughter's house has 13 stairs at home. Discharge dependent on physical therapy session and patient's ability to navigate stairs. ? ?Once discharged, she will do a home exercise program. Follow-up in clinic in 2 weeks. ? ?The PDMP database was reviewed today prior to any opioid medications being prescribed to this patient. ? ?R. , PA-C ?Orthopedic Surgery ?([id number]) [place] ?01/16/2022, 8:02 AM  ?

## 2022-01-17 DIAGNOSIS — M1612 Unilateral primary osteoarthritis, left hip: Secondary | ICD-10-CM | POA: Diagnosis not present

## 2022-01-17 LAB — BASIC METABOLIC PANEL
Anion gap: 7 (ref 5–15)
BUN: 16 mg/dL (ref 8–23)
CO2: 27 mmol/L (ref 22–32)
Calcium: 9.4 mg/dL (ref 8.9–10.3)
Chloride: 102 mmol/L (ref 98–111)
Creatinine, Ser: 0.52 mg/dL (ref 0.44–1.00)
GFR, Estimated: >60 mL/min (ref >60)
Glucose, Bld: 162 mg/dL — ABNORMAL HIGH (ref 70–99)
Potassium: 3.5 mmol/L (ref 3.5–5.1)
Sodium: 136 mmol/L (ref 135–145)

## 2022-01-17 LAB — CBC
HCT: 40.5 % (ref 36.0–46.0)
Hemoglobin: 13.3 g/dL (ref 12.0–15.0)
MCH: 29 pg (ref 26.0–34.0)
MCHC: 32.8 g/dL (ref 30.0–36.0)
MCV: 88.4 fL (ref 80.0–100.0)
Platelets: 226 10*3/uL (ref 150–400)
RBC: 4.58 MIL/uL (ref 3.87–5.11)
RDW: 14.6 % (ref 11.5–15.5)
WBC: 10 10*3/uL (ref 4.0–10.5)
nRBC: 0 % (ref 0.0–0.2)

## 2022-01-17 NOTE — Plan of Care (Signed)
  Problem: Education: Goal: Knowledge of General Education information will improve Description: Including pain rating scale, medication(s)/side effects and non-pharmacologic comfort measures Outcome: Progressing   Problem: Activity: Goal: Risk for activity intolerance will decrease Outcome: Progressing   Problem: Pain Managment: Goal: General experience of comfort will improve Outcome: Progressing   

## 2022-01-17 NOTE — Progress Notes (Signed)
? ?  Subjective: ?2 Days Post-Op Procedure(s) (LRB): ?TOTAL HIP ARTHROPLASTY ANTERIOR APPROACH (Left) ?Patient seen in rounds by Arther Abbott, PA-C and myself for Dr. Lequita Halt. ?Patient reports pain as mild and has improved since yesterday. ?Patient is well, and has had no acute complaints or problems. She continues to work with PT on stair training since daughter's home has 13 stairs. ?Plan is to go Home to daughter's house after hospital stay. ? ?Objective: ?Vital signs in last 24 hours: ?Temp:  [97.5 ?F (36.4 ?C)-98.8 ?F (37.1 ?C)] 97.5 ?F (36.4 ?C) (03/17 0450) ?Pulse Rate:  [58-76] 58 (03/17 0450) ?Resp:  [16-18] 16 (03/17 0450) ?BP: (149-171)/(76-106) 149/92 (03/17 0450) ?SpO2:  [96 %-98 %] 98 % (03/17 0450) ? ?Intake/Output from previous day: ? ?Intake/Output Summary (Last 24 hours) at 01/17/2022 0729 ?Last data filed at 01/16/2022 0951 ?Gross per 24 hour  ?Intake 240 ml  ?Output --  ?Net 240 ml  ?  ?Intake/Output this shift: ?No intake/output data recorded. ? ?Labs: ?Recent Labs  ?  01/16/22 ?0326 01/17/22 ?0323  ?HGB 12.4 13.3  ? ?Recent Labs  ?  01/16/22 ?0326 01/17/22 ?0323  ?WBC 9.9 10.0  ?RBC 4.25 4.58  ?HCT 37.8 40.5  ?PLT 219 226  ? ?Recent Labs  ?  01/16/22 ?0326 01/17/22 ?0323  ?NA 136 136  ?K 3.4* 3.5  ?CL 107 102  ?CO2 25 27  ?BUN 16 16  ?CREATININE 0.68 0.52  ?GLUCOSE 125* 162*  ?CALCIUM 8.7* 9.4  ? ?No results for input(s): LABPT, INR in the last 72 hours. ? ?Exam: ?General - Patient is Alert and Oriented ?Extremity - Neurologically intact ?Neurovascular intact ?Sensation intact distally ?Dorsiflexion/Plantar flexion intact ?Dressing/Incision - clean, dry, no drainage ?Motor Function - intact, moving foot and toes well on exam. ? ?Past Medical History:  ?Diagnosis Date  ? Arthritis   ? 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 Blood transfusion   ? Complication of anesthesia   ? woke up during colonoscopy  ? Dyspnea   ? With activity  ? H/O blood clots   ?  RIGHT KNEE  ? Heart palpitations   ? PSVT (paroxysmal supraventricular tachycardia) (HCC) 03/06/2018  ? EKG at the emergency room 03/06/2018: SVT at a rate of 137 bpm,  anteroseptal infarct old.  ? PVC's (premature ventricular contractions)   ? PVC's (premature ventricular contractions)   ? ? ?Assessment/Plan: ?2 Days Post-Op Procedure(s) (LRB): ?TOTAL HIP ARTHROPLASTY ANTERIOR APPROACH (Left) ?Principal Problem: ?  OA (osteoarthritis) of hip ?Active Problems: ?  Primary osteoarthritis of left hip ? ?Estimated body mass index is 32.68 kg/m? as calculated from the following: ?  Height as of this encounter: 5\' 10"  (1.778 m). ?  Weight as of this encounter: 103.3 kg. ?Advance diet ?Up with therapy ?D/C IV fluids ? ?DVT Prophylaxis - Xarelto ?Weight-bearing as tolerated. ? ?Plan is to go home with daughter after hospital stay. She will work with PT this morning to continue stair training. Anticipated discharge this afternoon pending PT session. ? ?R. , PA-C ?Orthopedic Surgery ?(336) [place] ?01/17/2022, 7:29 AM  ?

## 2022-01-17 NOTE — Progress Notes (Signed)
Physical Therapy Treatment ?Patient Details ?Name: Christina Duarte ?MRN: [id number] ?DOB: Dec 14, 1945 ?Today's Date: 01/17/2022 ? ? ?History of Present Illness 76 yo female s/p L DA THA. PMH: R DA THA,  TKA, PVCs, arthritis ? ?  ?PT Comments  ? ? POD # 2 pm session ?Daughter and Granddaughter present.  Demonstrated and instructed on safe handling with transfers, amb with walker and stairs.  Addressed all mobility questions, discussed appropriate activity, educated on use of ICE.  Pt ready for D/C to home. ?  ?Recommendations for follow up therapy are one component of a multi-disciplinary discharge planning process, led by the attending physician.  Recommendations may be updated based on patient status, additional functional criteria and insurance authorization. ? ?Follow Up Recommendations ? Follow physician's recommendations for discharge plan and follow up therapies ?  ?  ?Assistance Recommended at Discharge Intermittent Supervision/Assistance  ?Patient can return home with the following Help with stairs or ramp for entrance;Assistance with cooking/housework;Assist for transportation ?  ?Equipment Recommendations ? Rolling walker (2 wheels)  ?  ?Recommendations for Other Services   ? ? ?  ?Precautions / Restrictions Precautions ?Precautions: Fall ?Restrictions ?Weight Bearing Restrictions: No ?RLE Weight Bearing: Weight bearing as tolerated  ?  ? ?Mobility ? Bed Mobility ?  ?  ?  ?  ?  ?  ?  ?General bed mobility comments: OOB in recliner ?  ? ?Transfers ?Overall transfer level: Needs assistance ?Equipment used: Rolling walker (2 wheels) ?Transfers: Sit to/from Stand ?Sit to Stand: Supervision, Min guard ?  ?  ?  ?  ?  ?General transfer comment: increased self ability ?  ? ?Ambulation/Gait ?Ambulation/Gait assistance: Supervision, Min guard ?Gait Distance (Feet): 85 Feet ?Assistive device: Rolling walker (2 wheels) ?Gait Pattern/deviations: Step-to pattern, Step-through pattern, Decreased stance time - left ?Gait  velocity: decr ?  ?  ?General Gait Details: one VC safety with turns ? ? ?Stairs ?  ?  ?  ?  ?  ? ? ?Wheelchair Mobility ?  ? ?Modified Rankin (Stroke Patients Only) ?  ? ? ?  ?Balance   ?  ?  ?  ?  ?  ?  ?  ?  ?  ?  ?  ?  ?  ?  ?  ?  ?  ?  ?  ? ?  ?Cognition Arousal/Alertness: Awake/alert ?Behavior During Therapy: [facility] for tasks assessed/performed ?Overall Cognitive Status: Within Functional Limits for tasks assessed ?  ?  ?  ?  ?  ?  ?  ?  ?  ?  ?  ?  ?  ?  ?  ?  ?General Comments: AxO x 3 pleasant ?  ?  ? ?  ?Exercises   ? ?  ?General Comments   ?  ?  ? ?Pertinent Vitals/Pain Pain Assessment ?Pain Assessment: 0-10 ?Pain Score: 5  ?Pain Location: left thigh ?Pain Descriptors / Indicators: Discomfort, Aching, Sharp ?Pain Intervention(s): Monitored during session, Premedicated before session, Repositioned, Ice applied  ? ? ?Home Living   ?  ?  ?  ?  ?  ?  ?  ?  ?  ?   ?  ?Prior Function    ?  ?  ?   ? ?PT Goals (current goals can now be found in the care plan section) Progress towards PT goals: Progressing toward goals ? ?  ?Frequency ? ? ? 7X/week ? ? ? ?  ?PT Plan Current plan remains appropriate  ? ? ?Co-evaluation   ?  ?  ?  ?  ? ?  ?  AM-PAC PT "6 Clicks" Mobility   ?Outcome Measure ? Help needed turning from your back to your side while in a flat bed without using bedrails?: A Little ?Help needed moving from lying on your back to sitting on the side of a flat bed without using bedrails?: A Little ?Help needed moving to and from a bed to a chair (including a wheelchair)?: A Little ?Help needed standing up from a chair using your arms (e.g., wheelchair or bedside chair)?: A Little ?Help needed to walk in hospital room?: A Little ?Help needed climbing 3-5 steps with a railing? : A Little ?6 Click Score: 18 ? ?  ?End of Session Equipment Utilized During Treatment: Gait belt ?Activity Tolerance: Patient tolerated treatment well ?Patient left: with call bell/phone within reach;in chair ?Nurse Communication: Mobility  status ?PT Visit Diagnosis: Other abnormalities of gait and mobility (R26.89);Difficulty in walking, not elsewhere classified (R26.2) ?  ? ? ?Time: [id number] ?PT Time Calculation (min) (ACUTE ONLY): 16 min ? ?Charges:  $Gait Training: 8-22 mins ?$Therapeutic Activity: 8-22 mins          ?          ? ?Rica Koyanagi  PTA ?Acute  Rehabilitation Services ?Pager      [phone] ?Office      [phone] ? ? ?

## 2022-01-17 NOTE — Progress Notes (Signed)
Physical Therapy Treatment ?Patient Details ?Name: Christina Duarte ?MRN: [id number] ?DOB: [birth year] ?Today's Date: 01/17/2022 ? ? ?History of Present Illness 76 yo female s/p L DA THA. PMH: R DA THA,  TKA, PVCs, arthritis ? ?  ?PT Comments  ? ? POD # 2 am session ?Assisted with amb in hallway, practiced stairs.  Then returned to room to perform some TE's following HEP handout.  Instructed on proper tech, freq as well as use of ICE.   ?Pt will need another PT session with family this afternoon.   ?Recommendations for follow up therapy are one component of a multi-disciplinary discharge planning process, led by the attending physician.  Recommendations may be updated based on patient status, additional functional criteria and insurance authorization. ? ?Follow Up Recommendations ? Follow physician's recommendations for discharge plan and follow up therapies ?  ?  ?Assistance Recommended at Discharge Intermittent Supervision/Assistance  ?Patient can return home with the following Help with stairs or ramp for entrance;Assistance with cooking/housework;Assist for transportation ?  ?Equipment Recommendations ? Rolling walker (2 wheels)  ?  ?Recommendations for Other Services   ? ? ?  ?Precautions / Restrictions Precautions ?Precautions: Fall ?Restrictions ?Weight Bearing Restrictions: No ?RLE Weight Bearing: Weight bearing as tolerated  ?  ? ?Mobility ? Bed Mobility ?  ?  ?  ?  ?  ?  ?  ?General bed mobility comments: OOB in recliner ?  ? ?Transfers ?Overall transfer level: Needs assistance ?Equipment used: Rolling walker (2 wheels) ?Transfers: Sit to/from Stand ?Sit to Stand: Supervision, Min guard ?  ?  ?  ?  ?  ?General transfer comment: increased self ability ?  ? ?Ambulation/Gait ?Ambulation/Gait assistance: Supervision, Min guard ?Gait Distance (Feet): 85 Feet ?Assistive device: Rolling walker (2 wheels) ?Gait Pattern/deviations: Step-to pattern, Step-through pattern, Decreased stance time - left ?Gait velocity: decr ?   ?  ?General Gait Details: one VC safety with turns ? ? ?Stairs ?  ?  ?  ?  ?  ? ? ?Wheelchair Mobility ?  ? ?Modified Rankin (Stroke Patients Only) ?  ? ? ?  ?Balance   ?  ?  ?  ?  ?  ?  ?  ?  ?  ?  ?  ?  ?  ?  ?  ?  ?  ?  ?  ? ?  ?Cognition Arousal/Alertness: Awake/alert ?Behavior During Therapy: [facility] for tasks assessed/performed ?Overall Cognitive Status: Within Functional Limits for tasks assessed ?  ?  ?  ?  ?  ?  ?  ?  ?  ?  ?  ?  ?  ?  ?  ?  ?General Comments: AxO x 3 pleasant ?  ?  ? ?  ?Exercises   ? ?  ?General Comments   ?  ?  ? ?Pertinent Vitals/Pain Pain Assessment ?Pain Assessment: 0-10 ?Pain Score: 5  ?Pain Location: left thigh ?Pain Descriptors / Indicators: Discomfort, Aching, Sharp ?Pain Intervention(s): Monitored during session, Premedicated before session, Repositioned, Ice applied  ? ? ?Home Living   ?  ?  ?  ?  ?  ?  ?  ?  ?  ?   ?  ?Prior Function    ?  ?  ?   ? ?PT Goals (current goals can now be found in the care plan section) Progress towards PT goals: Progressing toward goals ? ?  ?Frequency ? ? ? 7X/week ? ? ? ?  ?PT Plan Current plan remains appropriate  ? ? ?  Co-evaluation   ?  ?  ?  ?  ? ?  ?AM-PAC PT "6 Clicks" Mobility   ?Outcome Measure ? Help needed turning from your back to your side while in a flat bed without using bedrails?: A Little ?Help needed moving from lying on your back to sitting on the side of a flat bed without using bedrails?: A Little ?Help needed moving to and from a bed to a chair (including a wheelchair)?: A Little ?Help needed standing up from a chair using your arms (e.g., wheelchair or bedside chair)?: A Little ?Help needed to walk in hospital room?: A Little ?Help needed climbing 3-5 steps with a railing? : A Little ?6 Click Score: 18 ? ?  ?End of Session Equipment Utilized During Treatment: Gait belt ?Activity Tolerance: Patient tolerated treatment well ?Patient left: with call bell/phone within reach;in chair ?Nurse Communication: Mobility status ?PT Visit  Diagnosis: Other abnormalities of gait and mobility (R26.89);Difficulty in walking, not elsewhere classified (R26.2) ?  ? ? ?Time: 8127-5170 ?PT Time Calculation (min) (ACUTE ONLY): 25 min ? ?Charges:  $Gait Training: 8-22 mins ?$Therapeutic Activity: 8-22 mins          ?          ? ?Felecia Shelling  PTA ?Acute  Rehabilitation Services ?Pager      [phone] ?Office      [phone] ? ?

## 2022-01-20 NOTE — Discharge Summary (Signed)
Physician Discharge Summary  ? ?Patient ID: ?[name] ?MRN: [id number] ?DOB/AGE: Jun 29, 1946 76 y.o. ? ?Admit date: 01/15/2022 ?Discharge date: 01/17/2022 ? ?Primary Diagnosis: osteoarthritis of the left hip  ? ?Admission Diagnoses:  ?Past Medical History:  ?Diagnosis Date  ? Arthritis   ? 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 Blood transfusion   ? Complication of anesthesia   ? woke up during colonoscopy  ? Dyspnea   ? With activity  ? H/O blood clots   ? RIGHT KNEE  ? Heart palpitations   ? PSVT (paroxysmal supraventricular tachycardia) (HCC) 03/06/2018  ? EKG at the emergency room 03/06/2018: SVT at a rate of 137 bpm,  anteroseptal infarct old.  ? PVC's (premature ventricular contractions)   ? PVC's (premature ventricular contractions)   ? ?Discharge Diagnoses:   ?Principal Problem: ?  OA (osteoarthritis) of hip ?Active Problems: ?  Primary osteoarthritis of left hip ? ?Estimated body mass index is 32.68 kg/m? as calculated from the following: ?  Height as of this encounter: 5\' 10"  (1.778 m). ?  Weight as of this encounter: 103.3 kg. ? ?Procedure:  ?Procedure(s) (LRB): ?TOTAL HIP ARTHROPLASTY ANTERIOR APPROACH (Left)  ? ?Consults: None ? ?HPI: Christina Duarte is a 76 y.o. female who has advanced end-  ?stage arthritis of their Left  hip with progressively worsening pain and  ?dysfunction.The patient has failed nonoperative management and presents for  ?total hip arthroplasty. ? ?Laboratory Data: ?Admission on 01/15/2022, Discharged on 01/17/2022  ?Component Date Value Ref Range Status  ? WBC 01/16/2022 9.9  4.0 - 10.5 K/uL Final  ? RBC 01/16/2022 4.25  3.87 - 5.11 MIL/uL Final  ? Hemoglobin 01/16/2022 12.4  12.0 - 15.0 g/dL Final  ? HCT 01/18/2022 37.8  36.0 - 46.0 % Final  ? MCV 01/16/2022 88.9  80.0 - 100.0 fL Final  ? MCH 01/16/2022 29.2  26.0 - 34.0 pg Final  ? MCHC 01/16/2022 32.8  30.0 - 36.0 g/dL Final  ? RDW 01/18/2022 14.5  11.5 - 15.5 % Final  ? Platelets  01/16/2022 219  150 - 400 K/uL Final  ? nRBC 01/16/2022 0.0  0.0 - 0.2 % Final  ? Performed at [facility], [address] [place], [place]  ? Sodium 01/16/2022 136  135 - 145 mmol/L Final  ? Potassium 01/16/2022 3.4 (L)  3.5 - 5.1 mmol/L Final  ? Chloride 01/16/2022 107  98 - 111 mmol/L Final  ? CO2 01/16/2022 25  22 - 32 mmol/L Final  ? Glucose, Bld 01/16/2022 125 (H)  70 - 99 mg/dL Final  ? Glucose reference range applies only to samples taken after fasting for at least 8 hours.  ? BUN 01/16/2022 16  8 - 23 mg/dL Final  ? Creatinine, Ser 01/16/2022 0.68  0.44 - 1.00 mg/dL Final  ? Calcium 01/18/2022 8.7 (L)  8.9 - 10.3 mg/dL Final  ? GFR, Estimated 01/16/2022 >60  >60 mL/min Final  ? Comment: (NOTE) ?Calculated using the CKD-EPI Creatinine Equation (2021) ?  ? Anion gap 01/16/2022 4 (L)  5 - 15 Final  ? Performed at [facility], [address] [place], [place]  ? WBC 01/17/2022 10.0  4.0 - 10.5 K/uL Final  ? RBC 01/17/2022 4.58  3.87 - 5.11 MIL/uL Final  ? Hemoglobin 01/17/2022 13.3  12.0 - 15.0 g/dL Final  ? HCT 01/19/2022 40.5  36.0 - 46.0 % Final  ? MCV  01/17/2022 88.4  80.0 - 100.0 fL Final  ? MCH 01/17/2022 29.0  26.0 - 34.0 pg Final  ? MCHC 01/17/2022 32.8  30.0 - 36.0 g/dL Final  ? RDW 16/10/960403/17/2023 14.6  11.5 - 15.5 % Final  ? Platelets 01/17/2022 226  150 - 400 K/uL Final  ? nRBC 01/17/2022 0.0  0.0 - 0.2 % Final  ? Performed at [facility], [address] [place], [place] [address]  ? Sodium 01/17/2022 136  135 - 145 mmol/L Final  ? Potassium 01/17/2022 3.5  3.5 - 5.1 mmol/L Final  ? Chloride 01/17/2022 102  98 - 111 mmol/L Final  ? CO2 01/17/2022 27  22 - 32 mmol/L Final  ? Glucose, Bld 01/17/2022 162 (H)  70 - 99 mg/dL Final  ? Glucose reference range applies only to samples taken after fasting for at least 8 hours.  ? BUN 01/17/2022 16  8 - 23 mg/dL Final  ? Creatinine, Ser 01/17/2022 0.52  0.44 - 1.00 mg/dL Final  ?  Calcium 11/91/478203/17/2023 9.4  8.9 - 10.3 mg/dL Final  ? GFR, Estimated 01/17/2022 >60  >60 mL/min Final  ? Comment: (NOTE) ?Calculated using the CKD-EPI Creatinine Equation (2021) ?  ? Anion gap 01/17/2022 7  5 - 15 Final  ? Performed at [facility], [address] [place], [place] [address]  ?Hospital Outpatient Visit on 01/13/2022  ?Component Date Value Ref Range Status  ? SARS Coronavirus 2 01/13/2022 NEGATIVE  NEGATIVE Final  ? Comment: (NOTE) ?SARS-CoV-2 target nucleic acids are NOT DETECTED. ? ?The SARS-CoV-2 RNA is generally detectable in upper and lower ?respiratory specimens during the acute phase of infection. Negative ?results do not preclude SARS-CoV-2 infection, do not rule out ?co-infections with other pathogens, and should not be used as the ?sole basis for treatment or other patient management decisions. ?Negative results must be combined with clinical observations, ?patient history, and epidemiological information. The expected ?result is Negative. ? ?Fact Sheet for Patients: ?HairSlick.nohttps://www.fda.gov/media/138098/download ? ?Fact Sheet for Healthcare Providers: ?quierodirigir.comhttps://www.fda.gov/media/138095/download ? ?This test is not yet approved or cleared by the [place] FDA and  ?has been authorized for detection and/or diagnosis of SARS-CoV-2 by ?FDA under an Emergency Use Authorization (EUA). This EUA will remain  ?in effect (meaning this test can be used) for the duration of the ?COVID-19 declaration under Se  ?                        ction 564(b)(1) of the Act, 21 U.S.C. ?section 360bbb-3(b)(1), unless the authorization is terminated or ?revoked sooner. ? ?Performed at [facility], [address] [place], [place] [address] ?  ?Hospital Outpatient Visit on 01/02/2022  ?Component Date Value Ref Range Status  ? MRSA, PCR 01/02/2022 NEGATIVE  NEGATIVE Final  ? Staphylococcus aureus 01/02/2022 NEGATIVE  NEGATIVE Final  ? Comment: (NOTE) ?The Xpert SA Assay (FDA approved for NASAL  specimens in patients 6622 ?years of age and older), is one component of a comprehensive ?surveillance program. It is not intended to diagnose infection nor to ?guide or monitor treatment. ?Performed at [facility], [address] ?[place], [place] [address] ?  ? WBC 01/02/2022 3.4 (L)  4.0 - 10.5 K/uL Final  ? RBC 01/02/2022 4.92  3.87 - 5.11 MIL/uL Final  ? Hemoglobin 01/02/2022 14.4  12.0 - 15.0 g/dL Final  ? HCT 62/95/284103/12/2021 44.1  36.0 - 46.0 % Final  ? MCV 01/02/2022 89.6  80.0 - 100.0 fL Final  ? MCH 01/02/2022 29.3  26.0 - 34.0 pg Final  ? MCHC 01/02/2022 32.7  30.0 - 36.0 g/dL Final  ? RDW 62/37/6283 14.4  11.5 - 15.5 % Final  ? Platelets 01/02/2022 226  150 - 400 K/uL Final  ? nRBC 01/02/2022 0.0  0.0 - 0.2 % Final  ? Performed at [facility], [address] [place], [place] [address]  ? Sodium 01/02/2022 138  135 - 145 mmol/L Final  ? Potassium 01/02/2022 3.8  3.5 - 5.1 mmol/L Final  ? Chloride 01/02/2022 105  98 - 111 mmol/L Final  ? CO2 01/02/2022 27  22 - 32 mmol/L Final  ? Glucose, Bld 01/02/2022 87  70 - 99 mg/dL Final  ? Glucose reference range applies only to samples taken after fasting for at least 8 hours.  ? BUN 01/02/2022 16  8 - 23 mg/dL Final  ? Creatinine, Ser 01/02/2022 0.51  0.44 - 1.00 mg/dL Final  ? Calcium 16/05/3709 9.3  8.9 - 10.3 mg/dL Final  ? Total Protein 01/02/2022 7.4  6.5 - 8.1 g/dL Final  ? Albumin 62/69/4854 4.3  3.5 - 5.0 g/dL Final  ? AST 62/70/3500 45 (H)  15 - 41 U/L Final  ? ALT 01/02/2022 44  0 - 44 U/L Final  ? Alkaline Phosphatase 01/02/2022 88  38 - 126 U/L Final  ? Total Bilirubin 01/02/2022 1.0  0.3 - 1.2 mg/dL Final  ? GFR, Estimated 01/02/2022 >60  >60 mL/min Final  ? Comment: (NOTE) ?Calculated using the CKD-EPI Creatinine Equation (2021) ?  ? Anion gap 01/02/2022 6  5 - 15 Final  ? Performed at [facility], [address], [place], [place] [address]  ? ABO/RH(D) 01/02/2022 O NEG   Final  ? Antibody Screen  01/02/2022 NEG   Final  ? Sample Expiration 01/02/2022 01/16/2022,2359   Final  ? Extend sample reason 01/02/2022    Final  ?                 Value:NO TRANSFUSIONS OR PREGNANCY IN THE PAST 3 MONTHS ?Performed at [facility]

## 2022-04-04 ENCOUNTER — Ambulatory Visit: Payer: Medicare Other | Admitting: Family

## 2022-04-09 ENCOUNTER — Encounter: Payer: Self-pay | Admitting: Family

## 2022-04-09 ENCOUNTER — Ambulatory Visit (INDEPENDENT_AMBULATORY_CARE_PROVIDER_SITE_OTHER): Payer: Medicare Other | Admitting: Family

## 2022-04-09 DIAGNOSIS — B351 Tinea unguium: Secondary | ICD-10-CM | POA: Diagnosis not present

## 2022-04-09 NOTE — Progress Notes (Signed)
Office Visit Note   Patient: Christina Duarte           Date of Birth: 05/28/1946           MRN: [id number] Visit Date: 04/09/2022              Requested by: [name], DO [address] [place],  [place] [address] PCP: [name], DO  Chief Complaint  Patient presents with   Right Foot - Follow-up   Left Foot - Follow-up      HPI: The patient is a 76 year old woman who presents today for bilateral foot evaluation.  She comes in periodically for routine nail changes she has inability to safely trim own nails. The patient complains of a painful ingrown toenail of the right great toe this is painful medially and laterally Assessment & Plan: Visit Diagnoses: No diagnosis found.  Plan: Nails trimmed today x10 offered partial nail the excision of the right.  Patient would like to defer at this time  Follow-Up Instructions: No follow-ups on file.   Ortho Exam  Patient is alert, oriented, no adenopathy, well-dressed, normal affect, normal respiratory effort. On examination of bilateral feet there is thickened and discolored onychomycotic nails x10 these were still trimmed today x10 without incident.  She does have ingrown nail of the right great toe medial and lateral border.  There are no wounds no calluses no impending skin breakdown  Imaging: No results found. No images are attached to the encounter.  Labs: Lab Results  Component Value Date   REPTSTATUS 02/01/2018 FINAL 01/31/2018   CULT  01/31/2018    NO GROWTH Performed at [facility], [address], [place], [place] [address]    Leanor Kail PNEUMONIAE 10/02/2012     Lab Results  Component Value Date   ALBUMIN 4.3 01/02/2022   ALBUMIN 4.5 03/06/2018   ALBUMIN 4.7 01/31/2018    Lab Results  Component Value Date   MG 1.6 (L) 03/06/2018   No results found for: VD25OH  No results found for: PREALBUMIN    Latest Ref Rng & Units 01/17/2022    3:23 AM 01/16/2022    3:26 AM  01/02/2022   11:03 AM  CBC EXTENDED  WBC 4.0 - 10.5 K/uL 10.0   9.9   3.4    RBC 3.87 - 5.11 MIL/uL 4.58   4.25   4.92    Hemoglobin 12.0 - 15.0 g/dL 78.9   38.1   01.7    HCT 36.0 - 46.0 % 40.5   37.8   44.1    Platelets 150 - 400 K/uL 226   219   226       There is no height or weight on file to calculate BMI.  Orders:  No orders of the defined types were placed in this encounter.  No orders of the defined types were placed in this encounter.    Procedures: No procedures performed  Clinical Data: No additional findings.  ROS:  All other systems negative, except as noted in the HPI. Review of Systems  Objective: Vital Signs: There were no vitals taken for this visit.  Specialty Comments:  No specialty comments available.  PMFS History: Patient Active Problem List   Diagnosis Date Noted   Primary osteoarthritis of left hip 01/15/2022   Nonspecific abnormal electrocardiogram (ECG) (EKG) 03/13/2019   PSVT (paroxysmal supraventricular tachycardia) (HCC) 03/06/2018   Varicose veins of bilateral lower extremities with other complications 01/04/2018   Status  post foot surgery 01/31/2013   Postop Hypokalemia 08/12/2012   OA (osteoarthritis) of hip 08/11/2012   Hyponatremia 09/30/2011   Osteoarthritis of left knee 09/30/2011   Past Medical History:  Diagnosis Date   Arthritis    "ALL OVER"  --OA LEFT KNEE-PLANS KNEE REPLACEMENT--S/P RT KNEE REPLACEMENT   Blood transfusion    Complication of anesthesia    woke up during colonoscopy   Dyspnea    With activity   H/O blood clots    RIGHT KNEE   Heart palpitations    PSVT (paroxysmal supraventricular tachycardia) (HCC) 03/06/2018   EKG at the emergency room 03/06/2018: SVT at a rate of 137 bpm,  anteroseptal infarct old.   PVC's (premature ventricular contractions)    PVC's (premature ventricular contractions)     Family History  Problem Relation Age of Onset   Heart disease Father    Colon cancer Neg Hx     Breast cancer Neg Hx     Past Surgical History:  Procedure Laterality Date   BUNIONECTOMY Right 02/17/11   Orpha Bur Right 01/20/13   [facility]   COLONOSCOPY     ENDOVENOUS ABLATION SAPHENOUS VEIN W/ LASER Right 07/07/2018   endovenous laser ablation R greater saphenous vein and stab phlebectomy > 20 incisions R leg by Fabienne Bruns MD    ENDOVENOUS ABLATION SAPHENOUS VEIN W/ LASER Left 10/06/2018   endovenous laser ablation left greater saphenous vein and stab phlebectomy > 20 incisions left leg by Fabienne Bruns MD    FOOT SURGERY  2012   RIGHT -BUNIONECTOMY   Hammer toe repair Right 02/17/11   HYSTEROSCOPY WITH D & C N/A 09/13/2013   Procedure: DILATATION AND CURETTAGE /HYSTEROSCOPY;  Surgeon: Miguel Aschoff, MD;  Location: [facility];  Service: Gynecology;  Laterality: N/A;   JOINT REPLACEMENT  2007    RIGHT KNEE   TONSILLECTOMY  1964   TOTAL HIP ARTHROPLASTY  08/11/2012   Procedure: TOTAL HIP ARTHROPLASTY;  Surgeon: Loanne Drilling, MD;  Location: [facility];  Service: Orthopedics;  Laterality: Right;   TOTAL HIP ARTHROPLASTY Left 01/15/2022   Procedure: TOTAL HIP ARTHROPLASTY ANTERIOR APPROACH;  Surgeon: Ollen Gross, MD;  Location: [facility];  Service: Orthopedics;  Laterality: Left;   TOTAL KNEE ARTHROPLASTY  09/29/2011   Procedure: TOTAL KNEE ARTHROPLASTY;  Surgeon: Loanne Drilling;  Location: [facility];  Service: Orthopedics;  Laterality: Left;   TUBAL LIGATION  1981   Social History   Occupational History   Not on file  Tobacco Use   Smoking status: Never   Smokeless tobacco: Never  Vaping Use   Vaping Use: Never used  Substance and Sexual Activity   Alcohol use: No   Drug use: No   Sexual activity: Not on file

## 2022-04-11 ENCOUNTER — Other Ambulatory Visit: Payer: Self-pay | Admitting: Orthopedic Surgery

## 2022-07-15 ENCOUNTER — Ambulatory Visit (INDEPENDENT_AMBULATORY_CARE_PROVIDER_SITE_OTHER): Payer: Medicare Other | Admitting: Family

## 2022-07-15 ENCOUNTER — Encounter: Payer: Self-pay | Admitting: Family

## 2022-07-15 DIAGNOSIS — B351 Tinea unguium: Secondary | ICD-10-CM | POA: Diagnosis not present

## 2022-07-15 NOTE — Progress Notes (Signed)
Office Visit Note   Patient: Christina Duarte           Date of Birth: November 29, 1945           MRN: [id number] Visit Date: 07/15/2022              Requested by: [name], DO [address] [place],  [place] [address] PCP: [name], DO  Chief Complaint  Patient presents with   Right Foot - Follow-up    Bilateral nail trimming   Left Foot - Follow-up      HPI: The patient is a 76 year old woman who presents today for bilateral foot evaluation.  She comes in periodically for routine nail changes she is unable to safely trim own nails. The patient complains of a painful toenail of the right great toe this is painful medially and laterally Assessment & Plan: Visit Diagnoses: No diagnosis found.  Plan: Nails trimmed today x10    Follow-Up Instructions: Return in about 3 months (around 10/14/2022).   Ortho Exam  Patient is alert, oriented, no adenopathy, well-dressed, normal affect, normal respiratory effort. On examination of bilateral feet there is thickened and discolored onychomycotic nails x10 these were still trimmed today x10 without incident.  She does have ingrown nail of the right great toe medial and lateral border.  There are no wounds no calluses no impending skin breakdown  Imaging: No results found. No images are attached to the encounter.  Labs: Lab Results  Component Value Date   REPTSTATUS 02/01/2018 FINAL 01/31/2018   CULT  01/31/2018    NO GROWTH Performed at [facility], [address], [place], [place] [address]    Leanor Kail PNEUMONIAE 10/02/2012     Lab Results  Component Value Date   ALBUMIN 4.3 01/02/2022   ALBUMIN 4.5 03/06/2018   ALBUMIN 4.7 01/31/2018    Lab Results  Component Value Date   MG 1.6 (L) 03/06/2018   No results found for: "VD25OH"  No results found for: "PREALBUMIN"    Latest Ref Rng & Units 01/17/2022    3:23 AM 01/16/2022    3:26 AM 01/02/2022   11:03 AM  CBC EXTENDED  WBC 4.0 -  10.5 K/uL 10.0  9.9  3.4   RBC 3.87 - 5.11 MIL/uL 4.58  4.25  4.92   Hemoglobin 12.0 - 15.0 g/dL 58.5  27.7  82.4   HCT 36.0 - 46.0 % 40.5  37.8  44.1   Platelets 150 - 400 K/uL 226  219  226      There is no height or weight on file to calculate BMI.  Orders:  No orders of the defined types were placed in this encounter.  No orders of the defined types were placed in this encounter.    Procedures: No procedures performed  Clinical Data: No additional findings.  ROS:  All other systems negative, except as noted in the HPI. Review of Systems  Objective: Vital Signs: There were no vitals taken for this visit.  Specialty Comments:  No specialty comments available.  PMFS History: Patient Active Problem List   Diagnosis Date Noted   Primary osteoarthritis of left hip 01/15/2022   Nonspecific abnormal electrocardiogram (ECG) (EKG) 03/13/2019   PSVT (paroxysmal supraventricular tachycardia) (HCC) 03/06/2018   Varicose veins of bilateral lower extremities with other complications 01/04/2018   Status post foot surgery 01/31/2013   Postop Hypokalemia 08/12/2012   OA (osteoarthritis) of hip 08/11/2012   Hyponatremia 09/30/2011  Osteoarthritis of left knee 09/30/2011   Past Medical History:  Diagnosis Date   Arthritis    "ALL OVER"  --OA LEFT KNEE-PLANS KNEE REPLACEMENT--S/P RT KNEE REPLACEMENT   Blood transfusion    Complication of anesthesia    woke up during colonoscopy   Dyspnea    With activity   H/O blood clots    RIGHT KNEE   Heart palpitations    PSVT (paroxysmal supraventricular tachycardia) (HCC) 03/06/2018   EKG at the emergency room 03/06/2018: SVT at a rate of 137 bpm,  anteroseptal infarct old.   PVC's (premature ventricular contractions)    PVC's (premature ventricular contractions)     Family History  Problem Relation Age of Onset   Heart disease Father    Colon cancer Neg Hx    Breast cancer Neg Hx     Past Surgical History:  Procedure  Laterality Date   BUNIONECTOMY Right 02/17/11   Orpha Bur Right 01/20/13   McBride   COLONOSCOPY     ENDOVENOUS ABLATION SAPHENOUS VEIN W/ LASER Right 07/07/2018   endovenous laser ablation R greater saphenous vein and stab phlebectomy > 20 incisions R leg by Fabienne Bruns MD    ENDOVENOUS ABLATION SAPHENOUS VEIN W/ LASER Left 10/06/2018   endovenous laser ablation left greater saphenous vein and stab phlebectomy > 20 incisions left leg by Fabienne Bruns MD    FOOT SURGERY  2012   RIGHT -BUNIONECTOMY   Hammer toe repair Right 02/17/11   HYSTEROSCOPY WITH D & C N/A 09/13/2013   Procedure: DILATATION AND CURETTAGE /HYSTEROSCOPY;  Surgeon: Miguel Aschoff, MD;  Location: [facility];  Service: Gynecology;  Laterality: N/A;   JOINT REPLACEMENT  2007    RIGHT KNEE   TONSILLECTOMY  1964   TOTAL HIP ARTHROPLASTY  08/11/2012   Procedure: TOTAL HIP ARTHROPLASTY;  Surgeon: Loanne Drilling, MD;  Location: [facility];  Service: Orthopedics;  Laterality: Right;   TOTAL HIP ARTHROPLASTY Left 01/15/2022   Procedure: TOTAL HIP ARTHROPLASTY ANTERIOR APPROACH;  Surgeon: Ollen Gross, MD;  Location: [facility];  Service: Orthopedics;  Laterality: Left;   TOTAL KNEE ARTHROPLASTY  09/29/2011   Procedure: TOTAL KNEE ARTHROPLASTY;  Surgeon: Loanne Drilling;  Location: [facility];  Service: Orthopedics;  Laterality: Left;   TUBAL LIGATION  1981   Social History   Occupational History   Not on file  Tobacco Use   Smoking status: Never   Smokeless tobacco: Never  Vaping Use   Vaping Use: Never used  Substance and Sexual Activity   Alcohol use: No   Drug use: No   Sexual activity: Not on file

## 2022-09-30 ENCOUNTER — Other Ambulatory Visit: Payer: Self-pay | Admitting: Family Medicine

## 2022-09-30 DIAGNOSIS — Z1231 Encounter for screening mammogram for malignant neoplasm of breast: Secondary | ICD-10-CM

## 2022-10-15 ENCOUNTER — Ambulatory Visit: Payer: Medicare Other | Admitting: Family

## 2022-10-17 ENCOUNTER — Ambulatory Visit (INDEPENDENT_AMBULATORY_CARE_PROVIDER_SITE_OTHER): Payer: Medicare Other | Admitting: Family

## 2022-10-17 DIAGNOSIS — B351 Tinea unguium: Secondary | ICD-10-CM

## 2022-10-21 ENCOUNTER — Encounter: Payer: Self-pay | Admitting: Family

## 2022-10-21 NOTE — Progress Notes (Signed)
Office Visit Note   Patient: Christina Duarte           Date of Birth: 23-Jun-1946           MRN: [id number] Visit Date: 10/17/2022              Requested by: [name], DO [address] [place],  [place] [address] PCP: [name], DO  Chief Complaint  Patient presents with   Right Foot - Follow-up    Bilateral foot nail trim    Left Foot - Follow-up      HPI: The patient is a 76 year old woman who presents today for bilateral foot evaluation.  She comes in periodically for routine nail trims as well. she is unable to safely trim own nails. The patient complains of a painful toenail of the right great toe this is painful medially and laterally Assessment & Plan: Visit Diagnoses: No diagnosis found.  Plan: would like to hold on intervention of the ingrown nail. Nails trimmed today x10. Patient tolerated well.   Follow-Up Instructions: Return in about 3 months (around 01/16/2023).   Ortho Exam  Patient is alert, oriented, no adenopathy, well-dressed, normal affect, normal respiratory effort. On examination of bilateral feet there is thickened and discolored onychomycotic nails x10 these were still trimmed today x10 without incident.  She does have ingrown nail of the right great toe medial and lateral border.  There are no wounds no calluses no impending skin breakdown  Imaging: No results found. No images are attached to the encounter.  Labs: Lab Results  Component Value Date   REPTSTATUS 02/01/2018 FINAL 01/31/2018   CULT  01/31/2018    NO GROWTH Performed at [facility], [address], [place], [place] [address]    Leanor Kail PNEUMONIAE 10/02/2012     Lab Results  Component Value Date   ALBUMIN 4.3 01/02/2022   ALBUMIN 4.5 03/06/2018   ALBUMIN 4.7 01/31/2018    Lab Results  Component Value Date   MG 1.6 (L) 03/06/2018   No results found for: "VD25OH"  No results found for: "PREALBUMIN"    Latest Ref Rng & Units  01/17/2022    3:23 AM 01/16/2022    3:26 AM 01/02/2022   11:03 AM  CBC EXTENDED  WBC 4.0 - 10.5 K/uL 10.0  9.9  3.4   RBC 3.87 - 5.11 MIL/uL 4.58  4.25  4.92   Hemoglobin 12.0 - 15.0 g/dL 29.5  28.4  13.2   HCT 36.0 - 46.0 % 40.5  37.8  44.1   Platelets 150 - 400 K/uL 226  219  226      There is no height or weight on file to calculate BMI.  Orders:  No orders of the defined types were placed in this encounter.  No orders of the defined types were placed in this encounter.    Procedures: No procedures performed  Clinical Data: No additional findings.  ROS:  All other systems negative, except as noted in the HPI. Review of Systems  Objective: Vital Signs: There were no vitals taken for this visit.  Specialty Comments:  No specialty comments available.  PMFS History: Patient Active Problem List   Diagnosis Date Noted   Primary osteoarthritis of left hip 01/15/2022   Nonspecific abnormal electrocardiogram (ECG) (EKG) 03/13/2019   PSVT (paroxysmal supraventricular tachycardia) 03/06/2018   Varicose veins of bilateral lower extremities with other complications 01/04/2018   Status post foot surgery 01/31/2013   Postop  Hypokalemia 08/12/2012   OA (osteoarthritis) of hip 08/11/2012   Hyponatremia 09/30/2011   Osteoarthritis of left knee 09/30/2011   Past Medical History:  Diagnosis Date   Arthritis    "ALL OVER"  --OA LEFT KNEE-PLANS KNEE REPLACEMENT--S/P RT KNEE REPLACEMENT   Blood transfusion    Complication of anesthesia    woke up during colonoscopy   Dyspnea    With activity   H/O blood clots    RIGHT KNEE   Heart palpitations    PSVT (paroxysmal supraventricular tachycardia) 03/06/2018   EKG at the emergency room 03/06/2018: SVT at a rate of 137 bpm,  anteroseptal infarct old.   PVC's (premature ventricular contractions)    PVC's (premature ventricular contractions)     Family History  Problem Relation Age of Onset   Heart disease Father    Colon  cancer Neg Hx    Breast cancer Neg Hx     Past Surgical History:  Procedure Laterality Date   BUNIONECTOMY Right 02/17/11   Orpha Bur Right 01/20/13   McBride   COLONOSCOPY     ENDOVENOUS ABLATION SAPHENOUS VEIN W/ LASER Right 07/07/2018   endovenous laser ablation R greater saphenous vein and stab phlebectomy > 20 incisions R leg by Fabienne Bruns MD    ENDOVENOUS ABLATION SAPHENOUS VEIN W/ LASER Left 10/06/2018   endovenous laser ablation left greater saphenous vein and stab phlebectomy > 20 incisions left leg by Fabienne Bruns MD    FOOT SURGERY  2012   RIGHT -BUNIONECTOMY   Hammer toe repair Right 02/17/11   HYSTEROSCOPY WITH D & C N/A 09/13/2013   Procedure: DILATATION AND CURETTAGE /HYSTEROSCOPY;  Surgeon: Miguel Aschoff, MD;  Location: [facility];  Service: Gynecology;  Laterality: N/A;   JOINT REPLACEMENT  2007    RIGHT KNEE   TONSILLECTOMY  1964   TOTAL HIP ARTHROPLASTY  08/11/2012   Procedure: TOTAL HIP ARTHROPLASTY;  Surgeon: Loanne Drilling, MD;  Location: [facility];  Service: Orthopedics;  Laterality: Right;   TOTAL HIP ARTHROPLASTY Left 01/15/2022   Procedure: TOTAL HIP ARTHROPLASTY ANTERIOR APPROACH;  Surgeon: Ollen Gross, MD;  Location: [facility];  Service: Orthopedics;  Laterality: Left;   TOTAL KNEE ARTHROPLASTY  09/29/2011   Procedure: TOTAL KNEE ARTHROPLASTY;  Surgeon: Loanne Drilling;  Location: [facility];  Service: Orthopedics;  Laterality: Left;   TUBAL LIGATION  1981   Social History   Occupational History   Not on file  Tobacco Use   Smoking status: Never   Smokeless tobacco: Never  Vaping Use   Vaping Use: Never used  Substance and Sexual Activity   Alcohol use: No   Drug use: No   Sexual activity: Not on file

## 2022-11-25 ENCOUNTER — Ambulatory Visit: Payer: Medicare Other

## 2022-12-25 ENCOUNTER — Ambulatory Visit: Payer: Federal, State, Local not specified - PPO | Admitting: Student

## 2023-01-16 ENCOUNTER — Encounter: Payer: Self-pay | Admitting: Family

## 2023-01-16 ENCOUNTER — Ambulatory Visit (INDEPENDENT_AMBULATORY_CARE_PROVIDER_SITE_OTHER): Payer: Medicare Other | Admitting: Family

## 2023-01-16 DIAGNOSIS — B351 Tinea unguium: Secondary | ICD-10-CM

## 2023-01-16 NOTE — Progress Notes (Unsigned)
Office Visit Note   Patient: Christina Duarte           Date of Birth: 1946-02-22           MRN: [id number] Visit Date: 01/16/2023              Requested by: Janie Morning, DO [address],  [place] [address] PCP: Janie Morning, DO  No chief complaint on file.     HPI: The patient is a 77 year old woman who presents today for bilateral foot evaluation she comes in routinely for foot check and nail trim as well.  Assessment & Plan: Visit Diagnoses: No diagnosis found.  Plan: Nails trimmed x 10 patient tolerated well she will follow-up in 3 months  Follow-Up Instructions: No follow-ups on file.   Ortho Exam  Patient is alert, oriented, no adenopathy, well-dressed, normal affect, normal respiratory effort. On examination bilateral feet she does have some thickened and discolored onychomycotic nails x 10.  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  These were trimmed x 10 after informed consent.  There is no wounds calluses or impending skin breakdown. Imaging: No results found. No images are attached to the encounter.  Labs: Lab Results  Component Value Date   REPTSTATUS 02/01/2018 FINAL 01/31/2018   CULT  01/31/2018    NO GROWTH Performed at [facility] [address] [place], [place] [address]    [place] 10/02/2012     Lab Results  Component Value Date   ALBUMIN 4.3 01/02/2022   ALBUMIN 4.5 03/06/2018   ALBUMIN 4.7 01/31/2018    Lab Results  Component Value Date   MG 1.6 (L) 03/06/2018   No results found for: "VD25OH"  No results found for: "PREALBUMIN"    Latest Ref Rng & Units 01/17/2022    3:23 AM 01/16/2022    3:26 AM 01/02/2022   11:03 AM  CBC EXTENDED  WBC 4.0 - 10.5 K/uL 10.0  9.9  3.4   RBC 3.87 - 5.11 MIL/uL 4.58  4.25  4.92   Hemoglobin 12.0 - 15.0 g/dL 13.3  12.4  14.4   HCT 36.0 - 46.0 % 40.5  37.8  44.1   Platelets 150 - 400 K/uL 226  219  226      There  is no height or weight on file to calculate BMI.  Orders:  No orders of the defined types were placed in this encounter.  No orders of the defined types were placed in this encounter.    Procedures: No procedures performed  Clinical Data: No additional findings.  ROS:  All other systems negative, except as noted in the HPI. Review of Systems  Objective: Vital Signs: There were no vitals taken for this visit.  Specialty Comments:  No specialty comments available.  PMFS History: Patient Active Problem List   Diagnosis Date Noted   Primary osteoarthritis of left hip 01/15/2022   Nonspecific abnormal electrocardiogram (ECG) (EKG) 03/13/2019   PSVT (paroxysmal supraventricular tachycardia) 03/06/2018   Varicose veins of bilateral lower extremities with other complications 0000000   Status post foot surgery 01/31/2013   Postop Hypokalemia 08/12/2012   OA (osteoarthritis) of hip 08/11/2012   Hyponatremia 09/30/2011   Osteoarthritis of left knee 09/30/2011   Past Medical History:  Diagnosis Date   Arthritis    "ALL OVER"  --OA LEFT KNEE-PLANS KNEE REPLACEMENT--S/P RT KNEE REPLACEMENT   Blood transfusion    Complication of  anesthesia    woke up during colonoscopy   Dyspnea    With activity   H/O blood clots    RIGHT KNEE   Heart palpitations    PSVT (paroxysmal supraventricular tachycardia) 03/06/2018   EKG at the emergency room 03/06/2018: SVT at a rate of 137 bpm,  anteroseptal infarct old.   PVC's (premature ventricular contractions)    PVC's (premature ventricular contractions)     Family History  Problem Relation Age of Onset   Heart disease Father    Colon cancer Neg Hx    Breast cancer Neg Hx     Past Surgical History:  Procedure Laterality Date   BUNIONECTOMY Right 02/17/11   Alphonzo Grieve Right 01/20/13   McBride   COLONOSCOPY     ENDOVENOUS ABLATION SAPHENOUS VEIN W/ LASER Right 07/07/2018   endovenous laser ablation R greater saphenous  vein and stab phlebectomy > 20 incisions R leg by Ruta Hinds MD    ENDOVENOUS ABLATION SAPHENOUS VEIN W/ LASER Left 10/06/2018   endovenous laser ablation left greater saphenous vein and stab phlebectomy > 20 incisions left leg by Ruta Hinds MD    FOOT SURGERY  2012   RIGHT -BUNIONECTOMY   Hammer toe repair Right 02/17/11   HYSTEROSCOPY WITH D & C N/A 09/13/2013   Procedure: DILATATION AND CURETTAGE /HYSTEROSCOPY;  Surgeon: Gus Height, MD;  Location: [facility];  Service: Gynecology;  Laterality: N/A;   JOINT REPLACEMENT  2007    RIGHT KNEE   TONSILLECTOMY  1964   TOTAL HIP ARTHROPLASTY  08/11/2012   Procedure: TOTAL HIP ARTHROPLASTY;  Surgeon: Gearlean Alf, MD;  Location: [facility];  Service: Orthopedics;  Laterality: Right;   TOTAL HIP ARTHROPLASTY Left 01/15/2022   Procedure: TOTAL HIP ARTHROPLASTY ANTERIOR APPROACH;  Surgeon: Gaynelle Arabian, MD;  Location: [facility];  Service: Orthopedics;  Laterality: Left;   TOTAL KNEE ARTHROPLASTY  09/29/2011   Procedure: TOTAL KNEE ARTHROPLASTY;  Surgeon: Gearlean Alf;  Location: [facility];  Service: Orthopedics;  Laterality: Left;   TUBAL LIGATION  1981   Social History   Occupational History   Not on file  Tobacco Use   Smoking status: Never   Smokeless tobacco: Never  Vaping Use   Vaping Use: Never used  Substance and Sexual Activity   Alcohol use: No   Drug use: No   Sexual activity: Not on file

## 2023-01-19 ENCOUNTER — Ambulatory Visit
Admission: RE | Admit: 2023-01-19 | Discharge: 2023-01-19 | Disposition: A | Discharge status: Home / Self Care | Payer: Medicare Other | Source: Ambulatory Visit | Attending: Family Medicine | Admitting: Family Medicine

## 2023-01-19 DIAGNOSIS — Z1231 Encounter for screening mammogram for malignant neoplasm of breast: Secondary | ICD-10-CM

## 2023-04-21 ENCOUNTER — Ambulatory Visit (INDEPENDENT_AMBULATORY_CARE_PROVIDER_SITE_OTHER): Payer: Medicare Other | Admitting: Family

## 2023-04-21 DIAGNOSIS — B351 Tinea unguium: Secondary | ICD-10-CM | POA: Diagnosis not present

## 2023-04-28 ENCOUNTER — Encounter: Payer: Self-pay | Admitting: Family

## 2023-04-28 NOTE — Progress Notes (Signed)
Office Visit Note   Patient: Christina Duarte           Date of Birth: 1946/02/20           MRN: [id number] Visit Date: 04/21/2023              Requested by: [name], DO [address] [place],  [place] [address] PCP: [name], DO  Chief Complaint  Patient presents with   Right Foot - Follow-up   Left Foot - Follow-up      HPI: The patient is a 77 year old woman who is seen in routine follow-up bilateral foot evaluation and nail trim.  No concerns voiced today.  No pain.  Assessment & Plan: Visit Diagnoses: No diagnosis found.  Plan: Nails trimmed x 10.  Patient tolerated well.  Follow-up in 3 months.  Follow-Up Instructions: Return in about 3 months (around 07/22/2023).   Ortho Exam  Patient is alert, oriented, no adenopathy, well-dressed, normal affect, normal respiratory effort. 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  These were trimmed x 10 without incident no calluses no wound no impending ulceration  Imaging: No results found. No images are attached to the encounter.  Labs: Lab Results  Component Value Date   REPTSTATUS 02/01/2018 FINAL 01/31/2018   CULT  01/31/2018    NO GROWTH Performed at [facility], [address] [place], [place] [address]    Leanor Kail PNEUMONIAE 10/02/2012     Lab Results  Component Value Date   ALBUMIN 4.3 01/02/2022   ALBUMIN 4.5 03/06/2018   ALBUMIN 4.7 01/31/2018    Lab Results  Component Value Date   MG 1.6 (L) 03/06/2018   No results found for: "VD25OH"  No results found for: "PREALBUMIN"    Latest Ref Rng & Units 01/17/2022    3:23 AM 01/16/2022    3:26 AM 01/02/2022   11:03 AM  CBC EXTENDED  WBC 4.0 - 10.5 K/uL 10.0  9.9  3.4   RBC 3.87 - 5.11 MIL/uL 4.58  4.25  4.92   Hemoglobin 12.0 - 15.0 g/dL 74.2  59.5  63.8   HCT 36.0 - 46.0 % 40.5  37.8  44.1    Platelets 150 - 400 K/uL 226  219  226      There is no height or weight on file to calculate BMI.  Orders:  No orders of the defined types were placed in this encounter.  No orders of the defined types were placed in this encounter.    Procedures: No procedures performed  Clinical Data: No additional findings.  ROS:  All other systems negative, except as noted in the HPI. Review of Systems  Objective: Vital Signs: There were no vitals taken for this visit.  Specialty Comments:  No specialty comments available.  PMFS History: Patient Active Problem List   Diagnosis Date Noted   Primary osteoarthritis of left hip 01/15/2022   Nonspecific abnormal electrocardiogram (ECG) (EKG) 03/13/2019   PSVT (paroxysmal supraventricular tachycardia) 03/06/2018   Varicose veins of bilateral lower extremities with other complications 01/04/2018   Status post foot surgery 01/31/2013   Postop Hypokalemia 08/12/2012   OA (osteoarthritis) of hip 08/11/2012   Hyponatremia 09/30/2011   Osteoarthritis of left knee 09/30/2011   Past Medical History:  Diagnosis Date   Arthritis    "ALL OVER"  --OA LEFT KNEE-PLANS KNEE REPLACEMENT--S/P RT  KNEE REPLACEMENT   Blood transfusion    Complication of anesthesia    woke up during colonoscopy   Dyspnea    With activity   H/O blood clots    RIGHT KNEE   Heart palpitations    PSVT (paroxysmal supraventricular tachycardia) 03/06/2018   EKG at the emergency room 03/06/2018: SVT at a rate of 137 bpm,  anteroseptal infarct old.   PVC's (premature ventricular contractions)    PVC's (premature ventricular contractions)     Family History  Problem Relation Age of Onset   Heart disease Father    Colon cancer Neg Hx    Breast cancer Neg Hx     Past Surgical History:  Procedure Laterality Date   BUNIONECTOMY Right 02/17/11   Orpha Bur Right 01/20/13   McBride   COLONOSCOPY     ENDOVENOUS ABLATION SAPHENOUS VEIN W/ LASER Right  07/07/2018   endovenous laser ablation R greater saphenous vein and stab phlebectomy > 20 incisions R leg by Fabienne Bruns MD    ENDOVENOUS ABLATION SAPHENOUS VEIN W/ LASER Left 10/06/2018   endovenous laser ablation left greater saphenous vein and stab phlebectomy > 20 incisions left leg by Fabienne Bruns MD    FOOT SURGERY  2012   RIGHT -BUNIONECTOMY   Hammer toe repair Right 02/17/11   HYSTEROSCOPY WITH D & C N/A 09/13/2013   Procedure: DILATATION AND CURETTAGE /HYSTEROSCOPY;  Surgeon: Miguel Aschoff, MD;  Location: [facility];  Service: Gynecology;  Laterality: N/A;   JOINT REPLACEMENT  2007    RIGHT KNEE   TONSILLECTOMY  1964   TOTAL HIP ARTHROPLASTY  08/11/2012   Procedure: TOTAL HIP ARTHROPLASTY;  Surgeon: Loanne Drilling, MD;  Location: [facility];  Service: Orthopedics;  Laterality: Right;   TOTAL HIP ARTHROPLASTY Left 01/15/2022   Procedure: TOTAL HIP ARTHROPLASTY ANTERIOR APPROACH;  Surgeon: Ollen Gross, MD;  Location: [facility];  Service: Orthopedics;  Laterality: Left;   TOTAL KNEE ARTHROPLASTY  09/29/2011   Procedure: TOTAL KNEE ARTHROPLASTY;  Surgeon: Loanne Drilling;  Location: [facility];  Service: Orthopedics;  Laterality: Left;   TUBAL LIGATION  1981   Social History   Occupational History   Not on file  Tobacco Use   Smoking status: Never   Smokeless tobacco: Never  Vaping Use   Vaping Use: Never used  Substance and Sexual Activity   Alcohol use: No   Drug use: No   Sexual activity: Not on file

## 2023-05-20 ENCOUNTER — Encounter: Payer: Self-pay | Admitting: Cardiology

## 2023-05-20 ENCOUNTER — Ambulatory Visit: Payer: Medicare Other | Admitting: Cardiology

## 2023-05-20 VITALS — BP 144/80 | HR 80 | Ht 70.0 in | Wt 237.0 lb

## 2023-05-20 DIAGNOSIS — I10 Essential (primary) hypertension: Secondary | ICD-10-CM

## 2023-05-20 DIAGNOSIS — I471 Supraventricular tachycardia, unspecified: Secondary | ICD-10-CM

## 2023-05-20 MED ORDER — AMLODIPINE BESYLATE 5 MG PO TABS: 5.0000 mg | ORAL_TABLET | Freq: Every day | ORAL | 3 refills | Status: DC

## 2023-05-20 NOTE — Progress Notes (Signed)
Primary Physician/Referring:  [name], DO  Patient ID: Christina Duarte, female    DOB: 30-Jun-1946, 77 y.o.   MRN: [id number]  Chief Complaint  Patient presents with  . paroxysmal supraventricular tachycardia  . Follow-up  . Palpitations    HPI: Christina Duarte  is a 77 y.o. African-American female with obesity, hyperlipidemia, hyperglycemia, primary hypertension, varicose veins of the lower extremity, degenerative joint disease, chronic palpitations, found to have SVT that resolved with Valsalva maneuver on 03/31/2018.   Except for occasional episodes of rapid heartbeat suggestive of SVT, she remains asymptomatic and does have chronic palpitations as well with exertional activity and also at rest lasting a few seconds.  She is aware of Valsalva maneuver.  Otherwise denies any specific complaints today.   Past Medical History:  Diagnosis Date  . 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 Blood transfusion   . Complication of anesthesia    woke up during colonoscopy  . Dyspnea    With activity  . H/O blood clots    RIGHT KNEE  . Heart palpitations   . PSVT (paroxysmal supraventricular tachycardia) 03/06/2018   EKG at the emergency room 03/06/2018: SVT at a rate of 137 bpm,  anteroseptal infarct old.  Marland Kitchen PVC's (premature ventricular contractions)   . PVC's (premature ventricular contractions)    Past Surgical History:  Procedure Laterality Date  . BUNIONECTOMY Right 02/17/11   McBride  . BUNIONECTOMY Right 01/20/13   McBride  . COLONOSCOPY    . ENDOVENOUS ABLATION SAPHENOUS VEIN W/ LASER Right 07/07/2018   endovenous laser ablation R greater saphenous vein and stab phlebectomy > 20 incisions R leg by Fabienne Bruns MD   . ENDOVENOUS ABLATION SAPHENOUS VEIN W/ LASER Left 10/06/2018   endovenous laser ablation left greater saphenous vein and stab phlebectomy > 20 incisions left leg by Fabienne Bruns MD   . FOOT SURGERY  2012    RIGHT -BUNIONECTOMY  . Hammer toe repair Right 02/17/11  . HYSTEROSCOPY WITH D & C N/A 09/13/2013   Procedure: DILATATION AND CURETTAGE /HYSTEROSCOPY;  Surgeon: Miguel Aschoff, MD;  Location: [facility];  Service: Gynecology;  Laterality: N/A;  . JOINT REPLACEMENT  2007    RIGHT KNEE  . TONSILLECTOMY  1964  . TOTAL HIP ARTHROPLASTY  08/11/2012   Procedure: TOTAL HIP ARTHROPLASTY;  Surgeon: Loanne Drilling, MD;  Location: [facility];  Service: Orthopedics;  Laterality: Right;  . TOTAL HIP ARTHROPLASTY Left 01/15/2022   Procedure: TOTAL HIP ARTHROPLASTY ANTERIOR APPROACH;  Surgeon: Ollen Gross, MD;  Location: [facility];  Service: Orthopedics;  Laterality: Left;  . TOTAL KNEE ARTHROPLASTY  09/29/2011   Procedure: TOTAL KNEE ARTHROPLASTY;  Surgeon: Loanne Drilling;  Location: [facility];  Service: Orthopedics;  Laterality: Left;  . TUBAL LIGATION  1981   Family History  Problem Relation Age of Onset  . Heart disease Father   . Colon cancer Neg Hx   . Breast cancer Neg Hx    Social History   Tobacco Use  . Smoking status: Never  . Smokeless tobacco: Never  Substance Use Topics  . Alcohol use: No   Marital Status: Divorced   ROS   Review of Systems  Cardiovascular:  Negative for chest pain, dyspnea on exertion and leg swelling.      Objective  Blood pressure (!) 144/80, pulse 80, height 5\' 10"  (1.778 m), weight 237 lb (107.5 kg), SpO2 96%. Body mass index  is 34.01 kg/m.     05/20/2023    1:00 PM 01/17/2022    2:26 PM 01/17/2022    4:50 AM  Vitals with BMI  Height 5\' 10"     Weight 237 lbs    BMI 34.01    Systolic 144 143 161  Diastolic 80 57 92  Pulse 80 62 58    Physical Exam Vitals reviewed.  Constitutional:      Appearance: She is well-developed. She is obese.  Neck:     Thyroid: No thyromegaly.     Vascular: No carotid bruit or JVD.  Cardiovascular:     Rate and Rhythm: Normal rate and regular rhythm.     Pulses: Normal pulses and intact distal pulses.     Heart sounds: Murmur  heard.     Scratchy early systolic murmur is present with a grade of 2/6 at the upper right sternal border.     No gallop.  Pulmonary:     Effort: Pulmonary effort is normal.     Breath sounds: Normal breath sounds.  Abdominal:     General: Bowel sounds are normal.     Palpations: Abdomen is soft.  Musculoskeletal:     Right lower leg: Edema (minimal) present.     Left lower leg: Edema (minmal) present.   Laboratory examination:      Latest Ref Rng & Units 01/17/2022    3:23 AM 01/16/2022    3:26 AM 01/02/2022   11:03 AM  CMP  Glucose 70 - 99 mg/dL 096  045  87   BUN 8 - 23 mg/dL 16  16  16    Creatinine 0.44 - 1.00 mg/dL 4.09  8.11  9.14   Sodium 135 - 145 mmol/L 136  136  138   Potassium 3.5 - 5.1 mmol/L 3.5  3.4  3.8   Chloride 98 - 111 mmol/L 102  107  105   CO2 22 - 32 mmol/L 27  25  27    Calcium 8.9 - 10.3 mg/dL 9.4  8.7  9.3   Total Protein 6.5 - 8.1 g/dL   7.4   Total Bilirubin 0.3 - 1.2 mg/dL   1.0   Alkaline Phos 38 - 126 U/L   88   AST 15 - 41 U/L   45   ALT 0 - 44 U/L   44       Latest Ref Rng & Units 01/17/2022    3:23 AM 01/16/2022    3:26 AM 01/02/2022   11:03 AM  CBC  WBC 4.0 - 10.5 K/uL 10.0  9.9  3.4   Hemoglobin 12.0 - 15.0 g/dL 78.2  95.6  21.3   Hematocrit 36.0 - 46.0 % 40.5  37.8  44.1   Platelets 150 - 400 K/uL 226  219  226     External labs:  Cholesterol, total 205.000 m 07/14/2022 HDL 67.000 mg 07/14/2022 LDL 128.000 m 07/14/2022 Triglycerides 57.000 mg 07/14/2022  A1C 6.100 % 07/14/2022 TSH 2.690 07/14/2022   Hemoglobin 13.900 G/ 07/14/2022 Platelets 246.000 X1 07/14/2022  Creatinine, Serum 0.640 MG/ 07/14/2022 Potassium 3.500 mm 01/17/2022 Magnesium N/D ALT (SGPT) 44.000 IU/ 07/14/2022    Radiology   No results found.  Cardiac Studies:  PCV MYOCARDIAL PERFUSION WITH LEXISCAN 12/10/2021 Lexiscan nuclear stress test. 1 Day Rest/Stress Protocol. Stress EKG is non-diagnostic, as this is pharmacological stress test using Lexiscan. Normal  myocardial perfusion without convincing evidence of reversible myocardial ischemia. Left ventricular size is normal, no regional wall motion abnormalities,  LVEF preserved. Calculated LVEF 64%. Low risk study. Prior Lexiscan Stress test 08/23/2018 reported normal myocardial perfusion, LVEF per gated SPECT 65%, Low risk study.  PCV ECHOCARDIOGRAM COMPLETE 12/10/2021 Normal LV systolic function with visual EF 60-65%. Left ventricle cavity is normal in size. Moderate to severe left ventricular hypertrophy. Normal global wall motion. Indeterminate diastolic filling pattern, indeterminate LAP. Aortic valve sclerosis without stenosis. Trace aortic regurgitation. Native mitral valve, mild annular and leaflet calcification, trace regurgitation. Trace tricuspid regurgitation. No prior study for comparison.   Carotid artery duplex 08/05/2018: No hemodynamically significant arterial disease in the internal carotid artery bilaterally. Mild soft plaque noted bilaterally. Antegrade right vertebral artery flow. Antegrade left vertebral artery flow.  2 week event monitor 06/30/2018-07/13/2018: Dominant rhythm Normal sinus. 7 patient triggered events occured without reported symptoms that correlated with NSR with occasional PACs. Brief Episodes of ectopic atrial rhythm (day 1 at 11:43am and day 2 at 1243am). No A fib or SVT occurred.   EKG   EKG 05/20/2023: Normal sinus rhythm with rate of 66 bpm, left atrial enlargement, poor R progression, probably normal variant however cannot exclude anteroseptal infarct old.  No evidence of ischemia.  Compared to 11/13/2021, no significant change.  EKG at the emergency room 03/06/2018: SVT at a rate of 137 bpm,  anteroseptal infarct old.   Allergies   Allergies  Allergen Reactions  . Aspirin Other (See Comments)    Pt cant take when given with codeine. Makes her feel loopy Can take aspirin alone w/o any problem.   . Codeine Other (See Comments)    Pt cant take  when given with aspirin  Can take with Tylenol      Current Outpatient Medications:  .  acetaminophen (TYLENOL) 650 MG CR tablet, Take 1,300 mg by mouth every 8 (eight) hours as needed for pain., Disp: , Rfl:  .  atorvastatin (LIPITOR) 40 MG tablet, Take 40 mg by mouth daily., Disp: , Rfl:  .  Carboxymeth-Glyc-Polysorb PF (REFRESH OPTIVE MEGA-3) 0.5-1-0.5 % SOLN, Place 1 drop into both eyes in the morning, at noon, in the evening, and at bedtime., Disp: , Rfl:  .  Cholecalciferol (VITAMIN D) 50 MCG (2000 UT) CAPS, Take by mouth., Disp: , Rfl:  .  cyanocobalamin (VITAMIN B12) 500 MCG tablet, Take 500 mcg by mouth daily., Disp: , Rfl:  .  metoprolol succinate (TOPROL-XL) 50 MG 24 hr tablet, Take 50 mg by mouth daily., Disp: , Rfl:  .  Multiple Vitamins-Minerals (MULTIVITAMIN WITH MINERALS) tablet, Take 1 tablet by mouth daily., Disp: , Rfl:  .  sodium chloride (MURO 128) 2 % ophthalmic solution, Place 1 drop into both eyes in the morning and at bedtime., Disp: , Rfl:  .  White Petrolatum-Mineral Oil (ARTIFICIAL TEARS) ointment, in the morning, at noon, in the evening, and at bedtime., Disp: , Rfl:  .  amLODipine (NORVASC) 5 MG tablet, Take 1 tablet (5 mg total) by mouth daily., Disp: 90 tablet, Rfl: 3 .  methocarbamol (ROBAXIN) 500 MG tablet, Take 1 tablet (500 mg total) by mouth every 6 (six) hours as needed for muscle spasms., Disp: 40 tablet, Rfl: 0 .  rivaroxaban (XARELTO) 10 MG TABS tablet, Take 1 tablet (10 mg total) by mouth daily with breakfast for 20 days. Then resume one 81 mg aspirin a day., Disp: 20 tablet, Rfl: 0   Assessment   PSVT (paroxysmal supraventricular tachycardia) - Plan: EKG 12-Lead  Essential hypertension - Plan: amLODipine (NORVASC) 5 MG tablet  Meds ordered this  encounter  Medications  . amLODipine (NORVASC) 5 MG tablet    Sig: Take 1 tablet (5 mg total) by mouth daily.    Dispense:  90 tablet    Refill:  3   Medications Discontinued During This Encounter   Medication Reason  . amLODipine (NORVASC) 2.5 MG tablet Reorder      Recommendations:   Christina Duarte  is a 77 y.o.  African-American female with obesity, hyperlipidemia, hyperglycemia, primary hypertension, varicose veins of the lower extremity, degenerative joint disease, chronic palpitations, found to have SVT that resolved with Valsalva maneuver on 03/31/2018.   1. PSVT (paroxysmal supraventricular tachycardia) Patient has history of PSVT but the recurrences have been very few and very brief lasting.  She is aware of Valsalva maneuver right which terminates her SVTs.  As the PSVT symptoms are very few and short lasting, continue medical therapy and continue metoprolol.  She also has chronic palpitations that are also improved since being on beta-blocker, those palpitations are related to PACs and PVCs and also sinus tachycardia with exertion activity. - EKG 12-Lead  2. Essential hypertension Blood pressure is elevated today, will increase amlodipine from 2.5 mg to 5 mg daily.  Otherwise she remains stable from cardiac standpoint, aortic sclerotic murmur has not changed, she has had an echocardiogram that did not reveal any significant aortic stenosis, I will see her back on a as needed basis.  - amLODipine (NORVASC) 5 MG tablet; Take 1 tablet (5 mg total) by mouth daily.  Dispense: 90 tablet; Refill: 3  Other orders - Cholecalciferol (VITAMIN D) 50 MCG (2000 UT) CAPS; Take by mouth. - cyanocobalamin (VITAMIN B12) 500 MCG tablet; Take 500 mcg by mouth daily. - Multiple Vitamins-Minerals (MULTIVITAMIN WITH MINERALS) tablet; Take 1 tablet by mouth daily.   Yates Decamp, MD, [facility] 05/20/2023, 1:58 PM Office: [phone] Fax: [phone] Pager: [phone]

## 2023-07-24 ENCOUNTER — Ambulatory Visit (INDEPENDENT_AMBULATORY_CARE_PROVIDER_SITE_OTHER): Payer: Medicare Other | Admitting: Family

## 2023-07-24 ENCOUNTER — Encounter: Payer: Self-pay | Admitting: Family

## 2023-07-24 DIAGNOSIS — B351 Tinea unguium: Secondary | ICD-10-CM

## 2023-07-24 NOTE — Progress Notes (Signed)
Office Visit Note   Patient: Christina Duarte           Date of Birth: July 09, 1946           MRN: [id number] Visit Date: 07/24/2023              Requested by: [name], DO [address] [place],  [place] [address] PCP: [name], DO  Chief Complaint  Patient presents with   Right Foot - Follow-up   Left Foot - Follow-up      HPI: The patient is a 77 year old woman who is seen in routine follow-up bilateral foot evaluation and nail trim.  No concerns voiced today.  No pain.  Assessment & Plan: Visit Diagnoses: No diagnosis found.  Plan: Nails trimmed x 10.  Patient tolerated well.  Follow-up in 3 months.  Follow-Up Instructions: Return in about 3 months (around 10/23/2023).   Ortho Exam  Patient is alert, oriented, no adenopathy, well-dressed, normal affect, normal respiratory effort. 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  These were trimmed x 10 without incident no calluses no wound no impending ulceration  Imaging: No results found. No images are attached to the encounter.  Labs: Lab Results  Component Value Date   REPTSTATUS 02/01/2018 FINAL 01/31/2018   CULT  01/31/2018    NO GROWTH Performed at [facility], [address], [place], [place] [address]    Leanor Kail PNEUMONIAE 10/02/2012     Lab Results  Component Value Date   ALBUMIN 4.3 01/02/2022   ALBUMIN 4.5 03/06/2018   ALBUMIN 4.7 01/31/2018    Lab Results  Component Value Date   MG 1.6 (L) 03/06/2018   No results found for: "VD25OH"  No results found for: "PREALBUMIN"    Latest Ref Rng & Units 01/17/2022    3:23 AM 01/16/2022    3:26 AM 01/02/2022   11:03 AM  CBC EXTENDED  WBC 4.0 - 10.5 K/uL 10.0  9.9  3.4   RBC 3.87 - 5.11 MIL/uL 4.58  4.25  4.92   Hemoglobin 12.0 - 15.0 g/dL 82.9  56.2  13.0   HCT 36.0 - 46.0 % 40.5  37.8  44.1    Platelets 150 - 400 K/uL 226  219  226      There is no height or weight on file to calculate BMI.  Orders:  No orders of the defined types were placed in this encounter.  No orders of the defined types were placed in this encounter.    Procedures: No procedures performed  Clinical Data: No additional findings.  ROS:  All other systems negative, except as noted in the HPI. Review of Systems  Objective: Vital Signs: There were no vitals taken for this visit.  Specialty Comments:  No specialty comments available.  PMFS History: Patient Active Problem List   Diagnosis Date Noted   Primary osteoarthritis of left hip 01/15/2022   Nonspecific abnormal electrocardiogram (ECG) (EKG) 03/13/2019   PSVT (paroxysmal supraventricular tachycardia) 03/06/2018   Varicose veins of bilateral lower extremities with other complications 01/04/2018   Status post foot surgery 01/31/2013   Postop Hypokalemia 08/12/2012   OA (osteoarthritis) of hip 08/11/2012   Hyponatremia 09/30/2011   Osteoarthritis of left knee 09/30/2011   Past Medical History:  Diagnosis Date   Arthritis    "ALL OVER"  --OA LEFT KNEE-PLANS KNEE REPLACEMENT--S/P RT  KNEE REPLACEMENT   Blood transfusion    Complication of anesthesia    woke up during colonoscopy   Dyspnea    With activity   H/O blood clots    RIGHT KNEE   Heart palpitations    PSVT (paroxysmal supraventricular tachycardia) 03/06/2018   EKG at the emergency room 03/06/2018: SVT at a rate of 137 bpm,  anteroseptal infarct old.   PVC's (premature ventricular contractions)    PVC's (premature ventricular contractions)     Family History  Problem Relation Age of Onset   Heart disease Father    Colon cancer Neg Hx    Breast cancer Neg Hx     Past Surgical History:  Procedure Laterality Date   BUNIONECTOMY Right 02/17/11   Orpha Bur Right 01/20/13   McBride   COLONOSCOPY     ENDOVENOUS ABLATION SAPHENOUS VEIN W/ LASER Right  07/07/2018   endovenous laser ablation R greater saphenous vein and stab phlebectomy > 20 incisions R leg by Fabienne Bruns MD    ENDOVENOUS ABLATION SAPHENOUS VEIN W/ LASER Left 10/06/2018   endovenous laser ablation left greater saphenous vein and stab phlebectomy > 20 incisions left leg by Fabienne Bruns MD    FOOT SURGERY  2012   RIGHT -BUNIONECTOMY   Hammer toe repair Right 02/17/11   HYSTEROSCOPY WITH D & C N/A 09/13/2013   Procedure: DILATATION AND CURETTAGE /HYSTEROSCOPY;  Surgeon: Miguel Aschoff, MD;  Location: [facility];  Service: Gynecology;  Laterality: N/A;   JOINT REPLACEMENT  2007    RIGHT KNEE   TONSILLECTOMY  1964   TOTAL HIP ARTHROPLASTY  08/11/2012   Procedure: TOTAL HIP ARTHROPLASTY;  Surgeon: Loanne Drilling, MD;  Location: [facility];  Service: Orthopedics;  Laterality: Right;   TOTAL HIP ARTHROPLASTY Left 01/15/2022   Procedure: TOTAL HIP ARTHROPLASTY ANTERIOR APPROACH;  Surgeon: Ollen Gross, MD;  Location: [facility];  Service: Orthopedics;  Laterality: Left;   TOTAL KNEE ARTHROPLASTY  09/29/2011   Procedure: TOTAL KNEE ARTHROPLASTY;  Surgeon: Loanne Drilling;  Location: [facility];  Service: Orthopedics;  Laterality: Left;   TUBAL LIGATION  1981   Social History   Occupational History   Not on file  Tobacco Use   Smoking status: Never   Smokeless tobacco: Never  Vaping Use   Vaping status: Never Used  Substance and Sexual Activity   Alcohol use: No   Drug use: No   Sexual activity: Not on file

## 2023-09-11 ENCOUNTER — Encounter: Payer: Self-pay | Admitting: Gastroenterology

## 2023-10-23 ENCOUNTER — Ambulatory Visit (INDEPENDENT_AMBULATORY_CARE_PROVIDER_SITE_OTHER): Payer: Medicare Other | Admitting: Family

## 2023-10-23 ENCOUNTER — Encounter: Payer: Self-pay | Admitting: Family

## 2023-10-23 DIAGNOSIS — B351 Tinea unguium: Secondary | ICD-10-CM

## 2023-10-23 NOTE — Progress Notes (Signed)
Office Visit Note   Patient: Christina Duarte           Date of Birth: 1946/03/09           MRN: [id number] Visit Date: 10/23/2023              Requested by: [name], DO [address] [place],  [place] [address] PCP: [name], DO  Chief Complaint  Patient presents with   Right Foot - Follow-up   Left Foot - Follow-up      HPI: The patient is a 77 year old woman who is seen in routine follow-up bilateral foot evaluation and nail trim.  No concerns voiced today.  No pain.  Assessment & Plan: Visit Diagnoses: No diagnosis found.  Plan: Nails trimmed x 10.  Patient tolerated well.  Follow-up in 3 months.  Follow-Up Instructions: No follow-ups on file.   Ortho Exam  Patient is alert, oriented, no adenopathy, well-dressed, normal affect, normal respiratory effort. 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  These were trimmed x 10 without incident no calluses no wound no impending ulceration  Imaging: No results found. No images are attached to the encounter.  Labs: Lab Results  Component Value Date   REPTSTATUS 02/01/2018 FINAL 01/31/2018   CULT  01/31/2018    NO GROWTH Performed at [facility], [address], [place], [place] [address]    Leanor Kail PNEUMONIAE 10/02/2012     Lab Results  Component Value Date   ALBUMIN 4.3 01/02/2022   ALBUMIN 4.5 03/06/2018   ALBUMIN 4.7 01/31/2018    Lab Results  Component Value Date   MG 1.6 (L) 03/06/2018   No results found for: "VD25OH"  No results found for: "PREALBUMIN"    Latest Ref Rng & Units 01/17/2022    3:23 AM 01/16/2022    3:26 AM 01/02/2022   11:03 AM  CBC EXTENDED  WBC 4.0 - 10.5 K/uL 10.0  9.9  3.4   RBC 3.87 - 5.11 MIL/uL 4.58  4.25  4.92   Hemoglobin 12.0 - 15.0 g/dL 52.8  41.3  24.4   HCT 36.0 - 46.0 % 40.5  37.8  44.1   Platelets 150 - 400 K/uL 226   219  226      There is no height or weight on file to calculate BMI.  Orders:  No orders of the defined types were placed in this encounter.  No orders of the defined types were placed in this encounter.    Procedures: No procedures performed  Clinical Data: No additional findings.  ROS:  All other systems negative, except as noted in the HPI. Review of Systems  Objective: Vital Signs: There were no vitals taken for this visit.  Specialty Comments:  No specialty comments available.  PMFS History: Patient Active Problem List   Diagnosis Date Noted   Primary osteoarthritis of left hip 01/15/2022   Nonspecific abnormal electrocardiogram (ECG) (EKG) 03/13/2019   PSVT (paroxysmal supraventricular tachycardia) (HCC) 03/06/2018   Varicose veins of bilateral lower extremities with other complications 01/04/2018   Status post foot surgery 01/31/2013   Postop Hypokalemia 08/12/2012   OA (osteoarthritis) of hip 08/11/2012   Hyponatremia 09/30/2011   Osteoarthritis of left knee 09/30/2011   Past Medical History:  Diagnosis Date   Arthritis    "ALL OVER"  --OA LEFT KNEE-PLANS KNEE REPLACEMENT--S/P RT KNEE REPLACEMENT  Blood transfusion    Complication of anesthesia    woke up during colonoscopy   Dyspnea    With activity   H/O blood clots    RIGHT KNEE   Heart palpitations    PSVT (paroxysmal supraventricular tachycardia) 03/06/2018   EKG at the emergency room 03/06/2018: SVT at a rate of 137 bpm,  anteroseptal infarct old.   PVC's (premature ventricular contractions)    PVC's (premature ventricular contractions)     Family History  Problem Relation Age of Onset   Heart disease Father    Colon cancer Neg Hx    Breast cancer Neg Hx     Past Surgical History:  Procedure Laterality Date   BUNIONECTOMY Right 02/17/11   Orpha Bur Right 01/20/13   McBride   COLONOSCOPY     ENDOVENOUS ABLATION SAPHENOUS VEIN W/ LASER Right 07/07/2018   endovenous laser  ablation R greater saphenous vein and stab phlebectomy > 20 incisions R leg by Fabienne Bruns MD    ENDOVENOUS ABLATION SAPHENOUS VEIN W/ LASER Left 10/06/2018   endovenous laser ablation left greater saphenous vein and stab phlebectomy > 20 incisions left leg by Fabienne Bruns MD    FOOT SURGERY  2012   RIGHT -BUNIONECTOMY   Hammer toe repair Right 02/17/11   HYSTEROSCOPY WITH D & C N/A 09/13/2013   Procedure: DILATATION AND CURETTAGE /HYSTEROSCOPY;  Surgeon: Miguel Aschoff, MD;  Location: [facility];  Service: Gynecology;  Laterality: N/A;   JOINT REPLACEMENT  2007    RIGHT KNEE   TONSILLECTOMY  1964   TOTAL HIP ARTHROPLASTY  08/11/2012   Procedure: TOTAL HIP ARTHROPLASTY;  Surgeon: Loanne Drilling, MD;  Location: [facility];  Service: Orthopedics;  Laterality: Right;   TOTAL HIP ARTHROPLASTY Left 01/15/2022   Procedure: TOTAL HIP ARTHROPLASTY ANTERIOR APPROACH;  Surgeon: Ollen Gross, MD;  Location: [facility];  Service: Orthopedics;  Laterality: Left;   TOTAL KNEE ARTHROPLASTY  09/29/2011   Procedure: TOTAL KNEE ARTHROPLASTY;  Surgeon: Loanne Drilling;  Location: [facility];  Service: Orthopedics;  Laterality: Left;   TUBAL LIGATION  1981   Social History   Occupational History   Not on file  Tobacco Use   Smoking status: Never   Smokeless tobacco: Never  Vaping Use   Vaping status: Never Used  Substance and Sexual Activity   Alcohol use: No   Drug use: No   Sexual activity: Not on file

## 2023-12-15 ENCOUNTER — Other Ambulatory Visit: Payer: Self-pay | Admitting: Family Medicine

## 2023-12-15 DIAGNOSIS — Z1231 Encounter for screening mammogram for malignant neoplasm of breast: Secondary | ICD-10-CM

## 2024-01-21 ENCOUNTER — Ambulatory Visit
Admission: RE | Admit: 2024-01-21 | Discharge: 2024-01-21 | Disposition: A | Discharge status: Home / Self Care | Payer: Medicare Other | Source: Ambulatory Visit | Attending: Family Medicine | Admitting: Family Medicine

## 2024-01-21 DIAGNOSIS — Z1231 Encounter for screening mammogram for malignant neoplasm of breast: Secondary | ICD-10-CM

## 2024-01-22 ENCOUNTER — Ambulatory Visit: Payer: Medicare Other | Admitting: Family

## 2024-01-22 DIAGNOSIS — B351 Tinea unguium: Secondary | ICD-10-CM

## 2024-01-29 ENCOUNTER — Encounter: Payer: Self-pay | Admitting: Family

## 2024-01-29 NOTE — Progress Notes (Signed)
 Office Visit Note   Patient: Christina Duarte           Date of Birth: January 23, 1946           MRN: [id number] Visit Date: 01/22/2024              Requested by: [name], DO [address] [place],  [place] [address] PCP: [name], DO  Chief Complaint  Patient presents with   Right Foot - Follow-up    Nail trim    Left Foot - Follow-up      HPI: The patient is a 40 old woman seen in routine follow-up for bilateral foot evaluation as well as a nail trim.  She has no new concerns. Assessment & Plan: Visit Diagnoses: No diagnosis found.  Plan: Nails trimmed x 10.  Tolerated well.  Patient follow-up in 3 months  Follow-Up Instructions: No follow-ups on file.   Ortho Exam  Patient is alert, oriented, no adenopathy, well-dressed, normal affect, normal respiratory effort. On examination of the bilateral feet she has thickened and discolored onychomycotic nails x 10.  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  Nails were trimmed x 10 without incident.  Imaging: No results found. No images are attached to the encounter.  Labs: Lab Results  Component Value Date   REPTSTATUS 02/01/2018 FINAL 01/31/2018   CULT  01/31/2018    NO GROWTH Performed at [facility], [address] [place], [place] [address]    Leanor Kail PNEUMONIAE 10/02/2012     Lab Results  Component Value Date   ALBUMIN 4.3 01/02/2022   ALBUMIN 4.5 03/06/2018   ALBUMIN 4.7 01/31/2018    Lab Results  Component Value Date   MG 1.6 (L) 03/06/2018   No results found for: "VD25OH"  No results found for: "PREALBUMIN"    Latest Ref Rng & Units 01/17/2022    3:23 AM 01/16/2022    3:26 AM 01/02/2022   11:03 AM  CBC EXTENDED  WBC 4.0 - 10.5 K/uL 10.0  9.9  3.4   RBC 3.87 - 5.11 MIL/uL 4.58  4.25  4.92   Hemoglobin 12.0 - 15.0 g/dL 44.0  10.2  72.5   HCT 36.0 - 46.0 % 40.5  37.8  44.1   Platelets 150 - 400 K/uL 226  219  226       There is no height or weight on file to calculate BMI.  Orders:  No orders of the defined types were placed in this encounter.  No orders of the defined types were placed in this encounter.    Procedures: No procedures performed  Clinical Data: No additional findings.  ROS:  All other systems negative, except as noted in the HPI. Review of Systems  Objective: Vital Signs: There were no vitals taken for this visit.  Specialty Comments:  No specialty comments available.  PMFS History: Patient Active Problem List   Diagnosis Date Noted   Primary osteoarthritis of left hip 01/15/2022   Nonspecific abnormal electrocardiogram (ECG) (EKG) 03/13/2019   PSVT (paroxysmal supraventricular tachycardia) (HCC) 03/06/2018   Varicose veins of bilateral lower extremities with other complications 01/04/2018   Status post foot surgery 01/31/2013   Postop Hypokalemia 08/12/2012   OA (osteoarthritis) of hip 08/11/2012   Hyponatremia 09/30/2011   Osteoarthritis of left knee 09/30/2011   Past Medical History:  Diagnosis Date   Arthritis    "ALL OVER"  --OA LEFT KNEE-PLANS KNEE  REPLACEMENT--S/P RT KNEE REPLACEMENT   Blood transfusion    Complication of anesthesia    woke up during colonoscopy   Dyspnea    With activity   H/O blood clots    RIGHT KNEE   Heart palpitations    PSVT (paroxysmal supraventricular tachycardia) (HCC) 03/06/2018   EKG at the emergency room 03/06/2018: SVT at a rate of 137 bpm,  anteroseptal infarct old.   PVC's (premature ventricular contractions)    PVC's (premature ventricular contractions)     Family History  Problem Relation Age of Onset   Heart disease Father    Colon cancer Neg Hx    Breast cancer Neg Hx     Past Surgical History:  Procedure Laterality Date   BUNIONECTOMY Right 02/17/11   Orpha Bur Right 01/20/13   McBride   COLONOSCOPY     ENDOVENOUS ABLATION SAPHENOUS VEIN W/ LASER Right 07/07/2018   endovenous laser  ablation R greater saphenous vein and stab phlebectomy > 20 incisions R leg by Fabienne Bruns MD    ENDOVENOUS ABLATION SAPHENOUS VEIN W/ LASER Left 10/06/2018   endovenous laser ablation left greater saphenous vein and stab phlebectomy > 20 incisions left leg by Fabienne Bruns MD    FOOT SURGERY  2012   RIGHT -BUNIONECTOMY   Hammer toe repair Right 02/17/11   HYSTEROSCOPY WITH D & C N/A 09/13/2013   Procedure: DILATATION AND CURETTAGE /HYSTEROSCOPY;  Surgeon: Miguel Aschoff, MD;  Location: [facility];  Service: Gynecology;  Laterality: N/A;   JOINT REPLACEMENT  2007    RIGHT KNEE   TONSILLECTOMY  1964   TOTAL HIP ARTHROPLASTY  08/11/2012   Procedure: TOTAL HIP ARTHROPLASTY;  Surgeon: Loanne Drilling, MD;  Location: [facility];  Service: Orthopedics;  Laterality: Right;   TOTAL HIP ARTHROPLASTY Left 01/15/2022   Procedure: TOTAL HIP ARTHROPLASTY ANTERIOR APPROACH;  Surgeon: Ollen Gross, MD;  Location: [facility];  Service: Orthopedics;  Laterality: Left;   TOTAL KNEE ARTHROPLASTY  09/29/2011   Procedure: TOTAL KNEE ARTHROPLASTY;  Surgeon: Loanne Drilling;  Location: [facility];  Service: Orthopedics;  Laterality: Left;   TUBAL LIGATION  1981   Social History   Occupational History   Not on file  Tobacco Use   Smoking status: Never   Smokeless tobacco: Never  Vaping Use   Vaping status: Never Used  Substance and Sexual Activity   Alcohol use: No   Drug use: No   Sexual activity: Not on file

## 2024-03-02 ENCOUNTER — Encounter: Payer: Self-pay | Admitting: Plastic Surgery

## 2024-03-02 ENCOUNTER — Ambulatory Visit (INDEPENDENT_AMBULATORY_CARE_PROVIDER_SITE_OTHER): Admitting: Plastic Surgery

## 2024-03-02 VITALS — BP 156/85 | HR 66 | Ht 70.0 in | Wt 242.0 lb

## 2024-03-02 DIAGNOSIS — M546 Pain in thoracic spine: Secondary | ICD-10-CM | POA: Diagnosis not present

## 2024-03-02 DIAGNOSIS — M542 Cervicalgia: Secondary | ICD-10-CM

## 2024-03-02 DIAGNOSIS — N6489 Other specified disorders of breast: Secondary | ICD-10-CM

## 2024-03-02 DIAGNOSIS — N62 Hypertrophy of breast: Secondary | ICD-10-CM | POA: Diagnosis not present

## 2024-03-02 NOTE — Progress Notes (Signed)
 Referring Provider Christina Brand, DO [address] [place],  [place] [address]   CC:  Chief Complaint  Patient presents with   Advice Only      Christina Duarte is an 78 y.o. female.  HPI: Christina Duarte presents today with complaints of upper back and neck pain which she attributes to the large size of her breast.  She states she has had large breasts for many years however she has noted increasing pain over the past 3 years with increase in the length of her left breast during that period of time.  She also notes significant asymmetry of her breast with the left side larger than the right making it difficult to find bras which will fit appropriately.  She notes that the bras that she does wear allow her to either or or so constrictive is to be painful.  The bras that she wears also have caused a deep and permanent grooves in her shoulders.  She is interested in a breast reduction to improve the pain in her back and neck and to achieve a more anatomically acceptable symmetry.  Allergies  Allergen Reactions   Aspirin Other (See Comments)    Pt cant take when given with codeine. Makes her feel loopy Can take aspirin alone w/o any problem.    Codeine Other (See Comments)    Pt cant take when given with aspirin  Can take with Tylenol       Outpatient Encounter Medications as of 03/02/2024  Medication Sig   acetaminophen  (TYLENOL ) 650 MG CR tablet Take 1,300 mg by mouth every 8 (eight) hours as needed for pain.   amLODipine  (NORVASC ) 5 MG tablet Take 1 tablet (5 mg total) by mouth daily.   atorvastatin  (LIPITOR) 40 MG tablet Take 40 mg by mouth daily.   Carboxymeth-Glyc-Polysorb PF (REFRESH OPTIVE MEGA-3) 0.5-1-0.5 % SOLN Place 1 drop into both eyes in the morning, at noon, in the evening, and at bedtime.   Cholecalciferol (VITAMIN D) 50 MCG (2000 UT) CAPS Take by mouth.   cyanocobalamin (VITAMIN B12) 500 MCG tablet Take 500 mcg by mouth daily.   metoprolol  succinate (TOPROL -XL)  50 MG 24 hr tablet Take 50 mg by mouth daily.   Multiple Vitamins-Minerals (MULTIVITAMIN WITH MINERALS) tablet Take 1 tablet by mouth daily.   rivaroxaban  (XARELTO ) 10 MG TABS tablet Take 1 tablet (10 mg total) by mouth daily with breakfast for 20 days. Then resume one 81 mg aspirin a day.   sodium chloride  (MURO 128) 2 % ophthalmic solution Place 1 drop into both eyes in the morning and at bedtime.   No facility-administered encounter medications on file as of 03/02/2024.     Past Medical History:  Diagnosis Date   Arthritis    "ALL OVER"  --OA LEFT KNEE-PLANS KNEE REPLACEMENT--S/P RT KNEE REPLACEMENT   Blood transfusion    Complication of anesthesia    woke up during colonoscopy   Dyspnea    With activity   H/O blood clots    RIGHT KNEE   Heart palpitations    PSVT (paroxysmal supraventricular tachycardia) (HCC) 03/06/2018   EKG at the emergency room 03/06/2018: SVT at a rate of 137 bpm,  anteroseptal infarct old.   PVC's (premature ventricular contractions)    PVC's (premature ventricular contractions)     Past Surgical History:  Procedure Laterality Date   BUNIONECTOMY Right 02/17/11   Phyllis Breeze Right 01/20/13   McBride   COLONOSCOPY  ENDOVENOUS ABLATION SAPHENOUS VEIN W/ LASER Right 07/07/2018   endovenous laser ablation R greater saphenous vein and stab phlebectomy > 20 incisions R leg by Stacia Dynes MD    ENDOVENOUS ABLATION SAPHENOUS VEIN W/ LASER Left 10/06/2018   endovenous laser ablation left greater saphenous vein and stab phlebectomy > 20 incisions left leg by Stacia Dynes MD    FOOT SURGERY  2012   RIGHT -BUNIONECTOMY   Hammer toe repair Right 02/17/11   HYSTEROSCOPY WITH D & C N/A 09/13/2013   Procedure: DILATATION AND CURETTAGE /HYSTEROSCOPY;  Surgeon: Deann Exon, MD;  Location: [facility];  Service: Gynecology;  Laterality: N/A;   JOINT REPLACEMENT  2007    RIGHT KNEE   TONSILLECTOMY  1964   TOTAL HIP ARTHROPLASTY  08/11/2012   Procedure:  TOTAL HIP ARTHROPLASTY;  Surgeon: Aurther Blue, MD;  Location: [facility];  Service: Orthopedics;  Laterality: Right;   TOTAL HIP ARTHROPLASTY Left 01/15/2022   Procedure: TOTAL HIP ARTHROPLASTY ANTERIOR APPROACH;  Surgeon: Liliane Rei, MD;  Location: [facility];  Service: Orthopedics;  Laterality: Left;   TOTAL KNEE ARTHROPLASTY  09/29/2011   Procedure: TOTAL KNEE ARTHROPLASTY;  Surgeon: Aurther Blue;  Location: [facility];  Service: Orthopedics;  Laterality: Left;   TUBAL LIGATION  1981    Family History  Problem Relation Age of Onset   Heart disease Father    Colon cancer Neg Hx    Breast cancer Neg Hx     Social History   Social History Narrative   Not on file     Review of Systems General: Denies fevers, chills, weight loss CV: Denies chest pain, shortness of breath, palpitations Breast: She denies any previous breast issues.  Does feel the size of her breast and the asymmetry of her breast caused her considerable pain.  Physical Exam    03/02/2024    2:29 PM 05/20/2023    1:00 PM 01/17/2022    2:26 PM  Vitals with BMI  Height 5\' 10"  5\' 10"    Weight 242 lbs 237 lbs   BMI 34.72 34.01   Systolic 156 144 865  Diastolic 85 80 57  Pulse 66 80 62    General:  No acute distress,  Alert and oriented, Non-Toxic, Normal speech and affect Breast: Patient has extraordinarily large ptotic breasts with a greater than than 2 cup size asymmetry.  On physical exam there are no dominant masses and the nipples are normal in appearance without evidence of nipple discharge.  Her sternal notch nipple distance on the right is 45 cm and 49 cm on the left her fold and nipple distance on the right is 29 cm and 30 cm on the left Mammogram: March 2025 BI-RADS 1 Assessment/Plan Symptomatic macromastia, breast asymmetry: Patient has very large breasts with significant asymmetry and would definitely benefit from a bilateral breast reduction.  She will have at least 1000 g removed on the left probably  greater and 1000 g removed on the right.  I discussed breast reductions with her including showing her the location of the incisions and we discussed the unpredictable nature of scarring and wound healing.  We discussed the risks of bleeding, infection, and seroma formation she understands she will have drains postoperatively.  Because of her extraordinarily long sternal notch nipple distance and her fold to nipple distance on the left she will almost certainly require a free nipple graft.  I suspect she will need a free nipple graft on both breast.  We discussed  the postoperative limitations including no heavy lifting greater than 20 pounds no vigorous activity and no submerging incisions in water  for 6 weeks.  She will need to wear a compressive supportive garment for that period of time.  All questions were answered to her satisfaction and photographs were obtained today with her consent. She does have a history of SVT.  She will need general medical clearance from her primary care physician and cardiac clearance from her cardiologist.  She will need to be off of her aspirin for a minimum of 5 days prior to surgery and preferably 5 days after her surgery as well.  Teretha Ferguson 03/02/2024, 3:43 PM

## 2024-03-03 ENCOUNTER — Telehealth (HOSPITAL_BASED_OUTPATIENT_CLINIC_OR_DEPARTMENT_OTHER): Payer: Self-pay | Admitting: *Deleted

## 2024-03-03 NOTE — Telephone Encounter (Signed)
   Pre-operative Risk Assessment    Patient Name: Christina Duarte  DOB: Jun 11, 1946 MRN: [id number]   Date of last office visit: 05/20/2023 Date of next office visit: None  Request for Surgical Clearance    Procedure:  Bilateral breast reduction  Date of Surgery:  Clearance TBD                                 Surgeon:  Dr. Larraine Plush Surgeon's Group or Practice Name:  [facility] Phone number:  [phone] Fax number:  [phone]   Type of Clearance Requested:   - Medical  - Pharmacy:  Hold Rivaroxaban  (Xarelto ) Not indicated   Type of Anesthesia:  General    Additional requests/questions:    Signed, Lauris Port   03/03/2024, 9:29 AM

## 2024-03-03 NOTE — Telephone Encounter (Signed)
   Name: Christina Duarte  DOB: 1946/01/16  MRN: [id number]  Primary Cardiologist: None Last OV: 05/20/23 with Dr. Berry Bristol   Preoperative team, please contact this patient and set up a phone call appointment for further preoperative risk assessment. Please obtain consent and complete medication review. Thank you for your help.  I confirm that guidance regarding antiplatelet and oral anticoagulation therapy has been completed and, if necessary, noted below.  Xarelto  has not been prescribed by cardiology office, recommendations regarding holding of Xarelto  will need to come from prescribing office.  I also confirmed the patient resides in the state of [place] . As per [facility] Medical Board telemedicine laws, the patient must reside in the state in which the provider is licensed.  Jaliza Seifried D Taneeka Curtner, NP 03/03/2024, 11:15 AM [facility]

## 2024-03-03 NOTE — Telephone Encounter (Signed)
I left a message for the patient to call our office to schedule a tele visit for pre-op 

## 2024-03-04 NOTE — Telephone Encounter (Signed)
 2nd attempt to reach the pt to schedule tele preop appt.

## 2024-03-07 ENCOUNTER — Other Ambulatory Visit: Payer: Self-pay

## 2024-03-07 ENCOUNTER — Telehealth: Payer: Self-pay

## 2024-03-07 NOTE — Telephone Encounter (Signed)
 Pt returning call in regards to tele visit. Please advise

## 2024-03-07 NOTE — Telephone Encounter (Signed)
 Returned patient's call and scheduled her televisit

## 2024-03-07 NOTE — Telephone Encounter (Signed)
 Faxed request for surgical clearance to Dr. Berry Bristol with confirmed receipt [phone]) and Dr. Hazeline Lister [phone])

## 2024-03-07 NOTE — Telephone Encounter (Signed)
 Patient has been scheduled for televisit med rec and consent done     Patient Consent for Virtual Visit         Christina Duarte has provided verbal consent on 03/07/2024 for a virtual visit (video or telephone).   CONSENT FOR VIRTUAL VISIT FOR:  Christina Duarte  By participating in this virtual visit I agree to the following:  I hereby voluntarily request, consent and authorize [facility] and its employed or contracted physicians, physician assistants, nurse practitioners or other licensed health care professionals (the Practitioner), to provide me with telemedicine health care services (the "Services") as deemed necessary by the treating Practitioner. I acknowledge and consent to receive the Services by the Practitioner via telemedicine. I understand that the telemedicine visit will involve communicating with the Practitioner through live audiovisual communication technology and the disclosure of certain medical information by electronic transmission. I acknowledge that I have been given the opportunity to request an in-person assessment or other available alternative prior to the telemedicine visit and am voluntarily participating in the telemedicine visit.  I understand that I have the right to withhold or withdraw my consent to the use of telemedicine in the course of my care at any time, without affecting my right to future care or treatment, and that the Practitioner or I may terminate the telemedicine visit at any time. I understand that I have the right to inspect all information obtained and/or recorded in the course of the telemedicine visit and may receive copies of available information for a reasonable fee.  I understand that some of the potential risks of receiving the Services via telemedicine include:  Delay or interruption in medical evaluation due to technological equipment failure or disruption; Information transmitted may not be sufficient (e.g. poor resolution of images)  to allow for appropriate medical decision making by the Practitioner; and/or  In rare instances, security protocols could fail, causing a breach of personal health information.  Furthermore, I acknowledge that it is my responsibility to provide information about my medical history, conditions and care that is complete and accurate to the best of my ability. I acknowledge that Practitioner's advice, recommendations, and/or decision may be based on factors not within their control, such as incomplete or inaccurate data provided by me or distortions of diagnostic images or specimens that may result from electronic transmissions. I understand that the practice of medicine is not an exact science and that Practitioner makes no warranties or guarantees regarding treatment outcomes. I acknowledge that a copy of this consent can be made available to me via my patient portal [facility] MyChart), or I can request a printed copy by calling the office of [facility].    I understand that my insurance will be billed for this visit.   I have read or had this consent read to me. I understand the contents of this consent, which adequately explains the benefits and risks of the Services being provided via telemedicine.  I have been provided ample opportunity to ask questions regarding this consent and the Services and have had my questions answered to my satisfaction. I give my informed consent for the services to be provided through the use of telemedicine in my medical care

## 2024-03-11 ENCOUNTER — Ambulatory Visit: Attending: Cardiology | Admitting: Student

## 2024-03-11 DIAGNOSIS — Z01810 Encounter for preprocedural cardiovascular examination: Secondary | ICD-10-CM | POA: Diagnosis not present

## 2024-03-11 NOTE — Progress Notes (Signed)
 Virtual Visit via Telephone Note   Because of Christina Duarte's co-morbid illnesses, she is at least at moderate risk for complications without adequate follow up.  This format is felt to be most appropriate for this patient at this time.  The patient did not have access to video technology/had technical difficulties with video requiring transitioning to audio format only (telephone).  All issues noted in this document were discussed and addressed.  No physical exam could be performed with this format.  Please refer to the patient's chart for her consent to telehealth for [facility].  Evaluation Performed:  Preoperative cardiovascular risk assessment _____________   Date:  03/11/2024   Patient ID:  Christina Duarte, Christina Duarte 1946-05-12, MRN [id number] Patient Location:  Home Provider location:   Office  Primary Care Provider:  Pete Brand, DO Primary Cardiologist:  Knox Perl, MD  Chief Complaint / Patient Profile   78 y.o. y/o female with a h/o PSVT, hypertension, hyperlipidemia, varicose veins who is pending bilateral breast reduction by Dr. Carolynne Citron and presents today for telephonic preoperative cardiovascular risk assessment.  History of Present Illness    Christina Duarte is a 78 y.o. female who presents via audio/video conferencing for a telehealth visit today.  Pt was last seen in cardiology clinic on 05/20/2023 by Dr. Berry Bristol.  At that time Christina Duarte was stable from a cardiac standpoint.  The patient is now pending procedure as outlined above. Since her last visit, she is doing well. Patient denies shortness of breath, dyspnea on exertion, orthopnea or PND. She reports chronic right lower extremity edema that is not present in the morning and progresses throughout the day. She was diagnosed with phlebitis years ago. No chest pain, pressure, or tightness. She has occasional palpitations that she manages with vagal maneuvers. They are not lifestyle limiting. She is active at home  performing light to moderate household activities and walking around stores such as [employer].   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   PVC's (premature ventricular contractions)    PVC's (premature ventricular contractions)    Past Surgical History:  Procedure Laterality Date   BUNIONECTOMY Right 02/17/11   Phyllis Breeze Right 01/20/13   McBride   COLONOSCOPY     ENDOVENOUS ABLATION SAPHENOUS VEIN W/ LASER Right 07/07/2018   endovenous laser ablation R greater saphenous vein and stab phlebectomy > 20 incisions R leg by Stacia Dynes MD    ENDOVENOUS ABLATION SAPHENOUS VEIN W/ LASER Left 10/06/2018   endovenous laser ablation left greater saphenous vein and stab phlebectomy > 20 incisions left leg by Stacia Dynes MD    FOOT SURGERY  2012   RIGHT -BUNIONECTOMY   Hammer toe repair Right 02/17/11   HYSTEROSCOPY WITH D & C N/A 09/13/2013   Procedure: DILATATION AND CURETTAGE /HYSTEROSCOPY;  Surgeon: Deann Exon, MD;  Location: [facility];  Service: Gynecology;  Laterality: N/A;   JOINT REPLACEMENT  2007    RIGHT KNEE  TONSILLECTOMY  1964   TOTAL HIP ARTHROPLASTY  08/11/2012   Procedure: TOTAL HIP ARTHROPLASTY;  Surgeon: Aurther Blue, MD;  Location: [facility];  Service: Orthopedics;  Laterality: Right;   TOTAL HIP ARTHROPLASTY Left 01/15/2022   Procedure: TOTAL HIP ARTHROPLASTY ANTERIOR APPROACH;  Surgeon: Liliane Rei, MD;  Location: [facility];  Service: Orthopedics;  Laterality: Left;   TOTAL KNEE ARTHROPLASTY  09/29/2011   Procedure: TOTAL KNEE  ARTHROPLASTY;  Surgeon: Aurther Blue;  Location: [facility];  Service: Orthopedics;  Laterality: Left;   TUBAL LIGATION  1981    Allergies  Allergies  Allergen Reactions   Aspirin Other (See Comments)    Pt cant take when given with codeine. Makes her feel loopy Can take aspirin alone w/o any problem.    Codeine Other (See Comments)    Pt cant take when given with aspirin  Can take with Tylenol       Home Medications    Prior to Admission medications   Medication Sig Start Date End Date Taking? Authorizing Provider  acetaminophen  (TYLENOL ) 650 MG CR tablet Take 1,300 mg by mouth every 8 (eight) hours as needed for pain.    [provider]  amLODipine  (NORVASC ) 2.5 MG tablet Take 2.5 mg by mouth daily. 02/02/24   [provider]  atorvastatin  (LIPITOR) 40 MG tablet Take 40 mg by mouth daily.    [provider]  Carboxymeth-Glyc-Polysorb PF (REFRESH OPTIVE MEGA-3) 0.5-1-0.5 % SOLN Place 1 drop into both eyes in the morning, at noon, in the evening, and at bedtime.    [provider]  Cholecalciferol (VITAMIN D) 50 MCG (2000 UT) CAPS Take by mouth.    [provider]  cyanocobalamin (VITAMIN B12) 500 MCG tablet Take 500 mcg by mouth daily.    [provider]  metoprolol  succinate (TOPROL -XL) 50 MG 24 hr tablet Take 50 mg by mouth daily.    [provider]  Multiple Vitamins-Minerals (MULTIVITAMIN WITH MINERALS) tablet Take 1 tablet by mouth daily.    [provider]  sodium chloride  (MURO 128) 2 % ophthalmic solution Place 1 drop into both eyes in the morning and at bedtime.    [provider]    Physical Exam    Vital Signs:  Christina Duarte does not have vital signs available for review today.  Given telephonic nature of communication, physical exam is limited. AAOx3. NAD. Normal affect.  Speech and respirations are unlabored.   Assessment & Plan    Primary Cardiologist: Knox Perl,  MD  Preoperative cardiovascular risk assessment.  Bilateral breast reduction by Dr. Carolynne Citron  Chart reviewed as part of pre-operative protocol coverage. According to the RCRI, patient has a 0.4% risk of MACE. Patient reports activity equivalent to 4.0 METS (light to moderate household activities, walking in stores).   Given past medical history and time since last visit, based on ACC/AHA guidelines, Christina Duarte would be at acceptable risk for the planned procedure without further cardiovascular testing.   Patient was advised that if she develops new symptoms prior to surgery to contact our office to arrange a follow-up appointment.  she verbalized understanding.  Patient has never taken Xarelto .   I will route this recommendation to the requesting party via Epic fax function.  Please call with questions.  Time:   Today, I have spent 9 minutes with the patient with telehealth technology discussing medical history, symptoms, and management plan.     Morey Ar, NP  03/11/2024, 10:22 AM

## 2024-03-23 ENCOUNTER — Encounter (HOSPITAL_BASED_OUTPATIENT_CLINIC_OR_DEPARTMENT_OTHER): Payer: Self-pay | Admitting: Emergency Medicine

## 2024-03-23 ENCOUNTER — Other Ambulatory Visit: Payer: Self-pay

## 2024-03-23 ENCOUNTER — Emergency Department (HOSPITAL_BASED_OUTPATIENT_CLINIC_OR_DEPARTMENT_OTHER)
Admission: EM | Admit: 2024-03-23 | Discharge: 2024-03-23 | Disposition: A | Discharge status: Home / Self Care | Attending: Emergency Medicine | Admitting: Emergency Medicine

## 2024-03-23 ENCOUNTER — Emergency Department (HOSPITAL_BASED_OUTPATIENT_CLINIC_OR_DEPARTMENT_OTHER)

## 2024-03-23 DIAGNOSIS — Y92009 Unspecified place in unspecified non-institutional (private) residence as the place of occurrence of the external cause: Secondary | ICD-10-CM | POA: Insufficient documentation

## 2024-03-23 DIAGNOSIS — S0992XA Unspecified injury of nose, initial encounter: Secondary | ICD-10-CM | POA: Diagnosis present

## 2024-03-23 DIAGNOSIS — S022XXA Fracture of nasal bones, initial encounter for closed fracture: Secondary | ICD-10-CM | POA: Diagnosis not present

## 2024-03-23 DIAGNOSIS — W1839XA Other fall on same level, initial encounter: Secondary | ICD-10-CM | POA: Insufficient documentation

## 2024-03-23 NOTE — Discharge Instructions (Signed)
 We evaluated you for your fall.  Your CT scan does show a nasal fracture.  Please follow-up with ear nose and throat.  Try to avoid blowing your nose.  If you have any episodes of bleeding you can try over-the-counter Afrin, but only use this for maximum 3 days.  We also checked a CT scan of your head and CT scan of your neck, and we did not see any concerning injuries.  Please take 1000 mg of Tylenol  every 6 hours as needed for pain.  You can also apply ice to your face.  Please return if you have any worsening symptoms such as severe headaches, vomiting, or any other concerning symptoms.

## 2024-03-23 NOTE — ED Provider Notes (Signed)
 [place] [facility] Provider Note  CSN: [id number] Arrival date & time: 03/23/24 1433  Chief Complaint(s) Fall  HPI SHONNIE POUDRIER is a 78 y.o. female presenting to the emergency department after fall.  Patient reports that she was at home, not sure exactly what happened but seem to have tripped and fell forward onto her face.  Denies any loss of consciousness, presyncopal symptoms like lightheadedness, or any other symptoms like chest pain, shortness of breath, palpitations prior to her fall.  She reports that she has some pain in her nose and nosebleed which is improved.  She also had a little bit of neck pain but reports this is somewhat chronic.  Denies any injuries otherwise to her extremities and has been ambulatory.  Not on anticoagulation.   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   PVC's (premature ventricular contractions)    PVC's (premature ventricular contractions)    Patient Active Problem List   Diagnosis Date Noted   Primary osteoarthritis of left hip 01/15/2022   Nonspecific abnormal electrocardiogram (ECG) (EKG) 03/13/2019   PSVT (paroxysmal supraventricular tachycardia) (HCC) 03/06/2018   Varicose veins of bilateral lower extremities with other complications 01/04/2018   Status post foot surgery 01/31/2013   Postop Hypokalemia 08/12/2012   OA (osteoarthritis) of hip 08/11/2012   Hyponatremia 09/30/2011   Osteoarthritis of left knee 09/30/2011   Home Medication(s) Prior to Admission medications    Medication Sig Start Date End Date Taking? Authorizing Provider  acetaminophen  (TYLENOL ) 650 MG CR tablet Take 1,300 mg by mouth every 8 (eight) hours as needed for pain.    [provider]  amLODipine  (NORVASC ) 2.5 MG tablet Take 2.5 mg by mouth daily. 02/02/24   [provider]  atorvastatin  (LIPITOR) 40 MG tablet Take 40 mg by mouth daily.    [provider]  Carboxymeth-Glyc-Polysorb PF (REFRESH OPTIVE MEGA-3) 0.5-1-0.5 % SOLN Place 1 drop into both eyes in the morning, at noon, in the evening, and at bedtime.    [provider]  Cholecalciferol (VITAMIN D) 50 MCG (2000 UT) CAPS Take by mouth.    [provider]  cyanocobalamin (VITAMIN B12) 500 MCG tablet Take 500 mcg by mouth daily.    [provider]  metoprolol  succinate (TOPROL -XL) 50 MG 24 hr tablet Take 50 mg by mouth daily.    [provider]  Multiple Vitamins-Minerals (MULTIVITAMIN WITH MINERALS) tablet Take 1 tablet by mouth daily.    [provider]  sodium chloride  (MURO 128) 2 % ophthalmic solution Place 1 drop into both eyes in the morning and at bedtime.    [provider]  Past Surgical History Past Surgical History:  Procedure Laterality Date   BUNIONECTOMY Right 02/17/11   Phyllis Breeze Right 01/20/13   McBride   COLONOSCOPY     ENDOVENOUS ABLATION SAPHENOUS VEIN W/ LASER Right 07/07/2018   endovenous laser ablation R greater saphenous vein and stab phlebectomy > 20 incisions R leg by Stacia Dynes MD    ENDOVENOUS ABLATION SAPHENOUS VEIN W/ LASER Left 10/06/2018   endovenous laser ablation left greater saphenous vein and stab phlebectomy > 20 incisions left leg by Stacia Dynes MD    FOOT SURGERY  2012   RIGHT -BUNIONECTOMY   Hammer toe repair Right 02/17/11   HYSTEROSCOPY WITH D & C N/A 09/13/2013    Procedure: DILATATION AND CURETTAGE /HYSTEROSCOPY;  Surgeon: Deann Exon, MD;  Location: [facility];  Service: Gynecology;  Laterality: N/A;   JOINT REPLACEMENT  2007    RIGHT KNEE   TONSILLECTOMY  1964   TOTAL HIP ARTHROPLASTY  08/11/2012   Procedure: TOTAL HIP ARTHROPLASTY;  Surgeon: Aurther Blue, MD;  Location: [facility];  Service: Orthopedics;  Laterality: Right;   TOTAL HIP ARTHROPLASTY Left 01/15/2022   Procedure: TOTAL HIP ARTHROPLASTY ANTERIOR APPROACH;  Surgeon: Liliane Rei, MD;  Location: [facility];  Service: Orthopedics;  Laterality: Left;   TOTAL KNEE ARTHROPLASTY  09/29/2011   Procedure: TOTAL KNEE ARTHROPLASTY;  Surgeon: Aurther Blue;  Location: [facility];  Service: Orthopedics;  Laterality: Left;   TUBAL LIGATION  1981   Family History Family History  Problem Relation Age of Onset   Heart disease Father    Colon cancer Neg Hx    Breast cancer Neg Hx     Social History Social History   Tobacco Use   Smoking status: Never   Smokeless tobacco: Never  Vaping Use   Vaping status: Never Used  Substance Use Topics   Alcohol  use: No   Drug use: No   Allergies Aspirin and Codeine  Review of Systems Review of Systems  All other systems reviewed and are negative.   Physical Exam Vital Signs  I have reviewed the triage vital signs BP (!) 184/84   Pulse 70   Temp 98.1 F (36.7 C) (Oral)   Resp 18   Ht 5\' 10"  (1.778 m)   Wt 109.8 kg   SpO2 97%   BMI 34.72 kg/m  Physical Exam Vitals and nursing note reviewed.  Constitutional:      General: She is not in acute distress.    Appearance: She is well-developed.  HENT:     Head: Normocephalic and atraumatic.     Nose:     Comments: Some ecchymosis over nasal bridge beneath both eyes, mild tenderness to the nose, no obvious deformity.  No nasal septal hematoma.  Face otherwise stable.    Mouth/Throat:     Mouth: Mucous membranes are moist.  Eyes:     Pupils: Pupils are equal, round, and reactive to light.   Cardiovascular:     Rate and Rhythm: Normal rate and regular rhythm.     Heart sounds: No murmur heard. Pulmonary:     Effort: Pulmonary effort is normal. No respiratory distress.     Breath sounds: Normal breath sounds.  Abdominal:     General: Abdomen is flat.     Palpations: Abdomen is soft.     Tenderness: There is no abdominal tenderness.  Musculoskeletal:        General: No tenderness.     Right lower leg: No  edema.     Left lower leg: No edema.     Comments: No midline C, T, L-spine tenderness.  No chest wall tenderness or crepitus.  Full painless range of motion at the bilateral upper extremities including the shoulders, elbows, wrists, hand and fingers, and in the bilateral lower extremities including the hips, knees, ankle, toes.  No focal bony tenderness, injury or deformity.  Skin:    General: Skin is warm and dry.  Neurological:     General: No focal deficit present.     Mental Status: She is alert. Mental status is at baseline.  Psychiatric:        Mood and Affect: Mood normal.        Behavior: Behavior normal.     ED Results and Treatments Labs (all labs ordered are listed, but only abnormal results are displayed) Labs Reviewed - No data to display                                                                                                                        Radiology CT Head Wo Contrast Result Date: 03/23/2024 CLINICAL DATA:  Fall, facial injury, epistaxis EXAM: CT HEAD WITHOUT CONTRAST CT MAXILLOFACIAL WITHOUT CONTRAST CT CERVICAL SPINE WITHOUT CONTRAST TECHNIQUE: Multidetector CT imaging of the head, cervical spine, and maxillofacial structures were performed using the standard protocol without intravenous contrast. Multiplanar CT image reconstructions of the cervical spine and maxillofacial structures were also generated. RADIATION DOSE REDUCTION: This exam was performed according to the departmental dose-optimization program which includes automated  exposure control, adjustment of the mA and/or kV according to patient size and/or use of iterative reconstruction technique. COMPARISON:  None Available. FINDINGS: CT HEAD FINDINGS Brain: No evidence of acute infarction, hemorrhage, hydrocephalus, extra-axial collection or mass lesion/mass effect. Vascular: No hyperdense vessel or unexpected calcification. CT FACIAL BONES FINDINGS Skull: Normal. Negative for fracture or focal lesion. Facial bones: Minimally angulated fractures of the nasal bones (series 9, image 60). No other displaced fractures or dislocations. Sinuses/Orbits: No acute finding. Other: Soft tissue contusion of the nose. Thin walled, expansile lucent lesion of the right mandibular body measuring 2.6 x 1.6 cm (series 9, image 23). CT CERVICAL SPINE FINDINGS Alignment: Straightening of the normal cervical lordosis. Skull base and vertebrae: No acute fracture. No primary bone lesion or focal pathologic process. Soft tissues and spinal canal: No prevertebral fluid or swelling. No visible canal hematoma. Disc levels: Mild multilevel cervical disc degenerative disease. Partial ankylosis of C3-C4. Upper chest: Negative. Other: None. IMPRESSION: 1. No acute intracranial pathology. 2. Minimally angulated fractures of the nasal bones. No other displaced fractures or dislocations of the facial bones. 3. Soft tissue contusion of the nose. 4. Thin walled, expansile lucent lesion of the right mandibular body, differential considerations including odontogenic keratocyst, dentigerous cysts, or ameloblastoma. 5. No fracture or static subluxation of the cervical spine. 6. Mild multilevel cervical disc degenerative disease. Electronically Signed   By: Fredricka Jenny M.D.   On: 03/23/2024  16:05   CT Cervical Spine Wo Contrast Result Date: 03/23/2024 CLINICAL DATA:  Fall, facial injury, epistaxis EXAM: CT HEAD WITHOUT CONTRAST CT MAXILLOFACIAL WITHOUT CONTRAST CT CERVICAL SPINE WITHOUT CONTRAST TECHNIQUE: Multidetector  CT imaging of the head, cervical spine, and maxillofacial structures were performed using the standard protocol without intravenous contrast. Multiplanar CT image reconstructions of the cervical spine and maxillofacial structures were also generated. RADIATION DOSE REDUCTION: This exam was performed according to the departmental dose-optimization program which includes automated exposure control, adjustment of the mA and/or kV according to patient size and/or use of iterative reconstruction technique. COMPARISON:  None Available. FINDINGS: CT HEAD FINDINGS Brain: No evidence of acute infarction, hemorrhage, hydrocephalus, extra-axial collection or mass lesion/mass effect. Vascular: No hyperdense vessel or unexpected calcification. CT FACIAL BONES FINDINGS Skull: Normal. Negative for fracture or focal lesion. Facial bones: Minimally angulated fractures of the nasal bones (series 9, image 60). No other displaced fractures or dislocations. Sinuses/Orbits: No acute finding. Other: Soft tissue contusion of the nose. Thin walled, expansile lucent lesion of the right mandibular body measuring 2.6 x 1.6 cm (series 9, image 23). CT CERVICAL SPINE FINDINGS Alignment: Straightening of the normal cervical lordosis. Skull base and vertebrae: No acute fracture. No primary bone lesion or focal pathologic process. Soft tissues and spinal canal: No prevertebral fluid or swelling. No visible canal hematoma. Disc levels: Mild multilevel cervical disc degenerative disease. Partial ankylosis of C3-C4. Upper chest: Negative. Other: None. IMPRESSION: 1. No acute intracranial pathology. 2. Minimally angulated fractures of the nasal bones. No other displaced fractures or dislocations of the facial bones. 3. Soft tissue contusion of the nose. 4. Thin walled, expansile lucent lesion of the right mandibular body, differential considerations including odontogenic keratocyst, dentigerous cysts, or ameloblastoma. 5. No fracture or static  subluxation of the cervical spine. 6. Mild multilevel cervical disc degenerative disease. Electronically Signed   By: Fredricka Jenny M.D.   On: 03/23/2024 16:05   CT Maxillofacial Wo Contrast Result Date: 03/23/2024 CLINICAL DATA:  Fall, facial injury, epistaxis EXAM: CT HEAD WITHOUT CONTRAST CT MAXILLOFACIAL WITHOUT CONTRAST CT CERVICAL SPINE WITHOUT CONTRAST TECHNIQUE: Multidetector CT imaging of the head, cervical spine, and maxillofacial structures were performed using the standard protocol without intravenous contrast. Multiplanar CT image reconstructions of the cervical spine and maxillofacial structures were also generated. RADIATION DOSE REDUCTION: This exam was performed according to the departmental dose-optimization program which includes automated exposure control, adjustment of the mA and/or kV according to patient size and/or use of iterative reconstruction technique. COMPARISON:  None Available. FINDINGS: CT HEAD FINDINGS Brain: No evidence of acute infarction, hemorrhage, hydrocephalus, extra-axial collection or mass lesion/mass effect. Vascular: No hyperdense vessel or unexpected calcification. CT FACIAL BONES FINDINGS Skull: Normal. Negative for fracture or focal lesion. Facial bones: Minimally angulated fractures of the nasal bones (series 9, image 60). No other displaced fractures or dislocations. Sinuses/Orbits: No acute finding. Other: Soft tissue contusion of the nose. Thin walled, expansile lucent lesion of the right mandibular body measuring 2.6 x 1.6 cm (series 9, image 23). CT CERVICAL SPINE FINDINGS Alignment: Straightening of the normal cervical lordosis. Skull base and vertebrae: No acute fracture. No primary bone lesion or focal pathologic process. Soft tissues and spinal canal: No prevertebral fluid or swelling. No visible canal hematoma. Disc levels: Mild multilevel cervical disc degenerative disease. Partial ankylosis of C3-C4. Upper chest: Negative. Other: None. IMPRESSION: 1. No  acute intracranial pathology. 2. Minimally angulated fractures of the nasal bones. No other displaced fractures or dislocations of  the facial bones. 3. Soft tissue contusion of the nose. 4. Thin walled, expansile lucent lesion of the right mandibular body, differential considerations including odontogenic keratocyst, dentigerous cysts, or ameloblastoma. 5. No fracture or static subluxation of the cervical spine. 6. Mild multilevel cervical disc degenerative disease. Electronically Signed   By: Fredricka Jenny M.D.   On: 03/23/2024 16:05    Pertinent labs & imaging results that were available during my care of the patient were reviewed by me and considered in my medical decision making (see MDM for details).  Medications Ordered in ED Medications - No data to display                                                                                                                                   Procedures Procedures  (including critical care time)  Medical Decision Making / ED Course   MDM:  78 year old presenting to the emergency department after fall.  Patient well-appearing, does have some signs of nasal trauma.  CT scans were obtained of the head, neck, and face.  Does have some minimally displaced nasal bone fracture.  No nasal septal hematoma.  CT head and neck negative for acute process.  Recommended follow-up with ENT.  Patient's physical examination is otherwise reassuring without signs of trauma.  She is ambulatory. She declined tylenol  or other pain medication.   Will discharge patient to home. All questions answered. Patient comfortable with plan of discharge. Return precautions discussed with patient and specified on the after visit summary.        Medicines ordered and prescription drug management: No orders of the defined types were placed in this encounter.   -I have reviewed the patients home medicines and have made adjustments as needed  Social Determinants of  Health:  Diagnosis or treatment significantly limited by social determinants of health: obesity   Co morbidities that complicate the patient evaluation  Past Medical History:  Diagnosis Date   Arthritis    "ALL OVER"  --OA LEFT KNEE-PLANS KNEE REPLACEMENT--S/P RT KNEE REPLACEMENT   Blood transfusion    Complication of anesthesia    woke up during colonoscopy   Dyspnea    With activity   H/O blood clots    RIGHT KNEE   Heart palpitations    PSVT (paroxysmal supraventricular tachycardia) (HCC) 03/06/2018   EKG at the emergency room 03/06/2018: SVT at a rate of 137 bpm,  anteroseptal infarct old.   PVC's (premature ventricular contractions)    PVC's (premature ventricular contractions)       Dispostion: Disposition decision including need for hospitalization was considered, and patient discharged from emergency department.    Final Clinical Impression(s) / ED Diagnoses Final diagnoses:  Closed fracture of nasal bone, initial encounter     This chart was dictated using voice recognition software.  Despite best efforts to proofread,  errors can occur which can change the documentation meaning.  Mordecai Applebaum, MD 03/23/24 2257

## 2024-03-23 NOTE — ED Triage Notes (Signed)
 Pt fell forward onto face at home today; reports epistaxis after fall; c/o pain to face and head; denies LOC; takes baby ASA

## 2024-04-22 ENCOUNTER — Ambulatory Visit: Admitting: Family

## 2024-04-22 ENCOUNTER — Encounter: Payer: Self-pay | Admitting: Family

## 2024-04-22 DIAGNOSIS — B351 Tinea unguium: Secondary | ICD-10-CM

## 2024-04-22 NOTE — Progress Notes (Signed)
 Office Visit Note   Patient: Christina Duarte           Date of Birth: 1946/10/28           MRN: [id number] Visit Date: 04/22/2024              Requested by: Pete Brand, DO [address] [place],  [place] [address] PCP: Pete Brand, DO  Chief Complaint  Patient presents with   Right Foot - Follow-up    Nail trim    Left Foot - Follow-up      HPI: The patient is a 1 old woman seen in routine follow-up for bilateral foot evaluation as well as a nail trim.  She has no new concerns. Assessment & Plan: Visit Diagnoses: No diagnosis found.  Plan: Nails trimmed x 10.  Tolerated well.  Patient follow-up in 3 months  Follow-Up Instructions: No follow-ups on file.   Ortho Exam  Patient is alert, oriented, no adenopathy, well-dressed, normal affect, normal respiratory effort. On examination of the bilateral feet she has thickened and discolored onychomycotic nails x 10.  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  Nails were trimmed x 10 without incident.  Imaging: No results found. No images are attached to the encounter.  Labs: Lab Results  Component Value Date   REPTSTATUS 02/01/2018 FINAL 01/31/2018   CULT  01/31/2018    NO GROWTH Performed at [facility], [address] [place], [place] [address]    Jacquie Maudlin PNEUMONIAE 10/02/2012     Lab Results  Component Value Date   ALBUMIN 4.3 01/02/2022   ALBUMIN 4.5 03/06/2018   ALBUMIN 4.7 01/31/2018    Lab Results  Component Value Date   MG 1.6 (L) 03/06/2018   No results found for: VD25OH  No results found for: PREALBUMIN    Latest Ref Rng & Units 01/17/2022    3:23 AM 01/16/2022    3:26 AM 01/02/2022   11:03 AM  CBC EXTENDED  WBC 4.0 - 10.5 K/uL 10.0  9.9  3.4   RBC 3.87 - 5.11 MIL/uL 4.58  4.25  4.92   Hemoglobin 12.0 - 15.0 g/dL 08.6  57.8  46.9   HCT 36.0 - 46.0 % 40.5  37.8  44.1   Platelets 150 - 400 K/uL 226  219  226       There is no height or weight on file to calculate BMI.  Orders:  No orders of the defined types were placed in this encounter.  No orders of the defined types were placed in this encounter.    Procedures: No procedures performed  Clinical Data: No additional findings.  ROS:  All other systems negative, except as noted in the HPI. Review of Systems  Objective: Vital Signs: There were no vitals taken for this visit.  Specialty Comments:  No specialty comments available.  PMFS History: Patient Active Problem List   Diagnosis Date Noted   Primary osteoarthritis of left hip 01/15/2022   Nonspecific abnormal electrocardiogram (ECG) (EKG) 03/13/2019   PSVT (paroxysmal supraventricular tachycardia) (HCC) 03/06/2018   Varicose veins of bilateral lower extremities with other complications 01/04/2018   Status post foot surgery 01/31/2013   Postop Hypokalemia 08/12/2012   OA (osteoarthritis) of hip 08/11/2012   Hyponatremia 09/30/2011   Osteoarthritis of left knee 09/30/2011   Past Medical History:  Diagnosis Date   Arthritis    ALL OVER  --OA LEFT KNEE-PLANS KNEE  REPLACEMENT--S/P RT KNEE REPLACEMENT   Blood transfusion    Complication of anesthesia    woke up during colonoscopy   Dyspnea    With activity   H/O blood clots    RIGHT KNEE   Heart palpitations    PSVT (paroxysmal supraventricular tachycardia) (HCC) 03/06/2018   EKG at the emergency room 03/06/2018: SVT at a rate of 137 bpm,  anteroseptal infarct old.   PVC's (premature ventricular contractions)    PVC's (premature ventricular contractions)     Family History  Problem Relation Age of Onset   Heart disease Father    Colon cancer Neg Hx    Breast cancer Neg Hx     Past Surgical History:  Procedure Laterality Date   BUNIONECTOMY Right 02/17/11   Phyllis Breeze Right 01/20/13   McBride   COLONOSCOPY     ENDOVENOUS ABLATION SAPHENOUS VEIN W/ LASER Right 07/07/2018   endovenous laser  ablation R greater saphenous vein and stab phlebectomy > 20 incisions R leg by Stacia Dynes MD    ENDOVENOUS ABLATION SAPHENOUS VEIN W/ LASER Left 10/06/2018   endovenous laser ablation left greater saphenous vein and stab phlebectomy > 20 incisions left leg by Stacia Dynes MD    FOOT SURGERY  2012   RIGHT -BUNIONECTOMY   Hammer toe repair Right 02/17/11   HYSTEROSCOPY WITH D & C N/A 09/13/2013   Procedure: DILATATION AND CURETTAGE /HYSTEROSCOPY;  Surgeon: Deann Exon, MD;  Location: [facility];  Service: Gynecology;  Laterality: N/A;   JOINT REPLACEMENT  2007    RIGHT KNEE   TONSILLECTOMY  1964   TOTAL HIP ARTHROPLASTY  08/11/2012   Procedure: TOTAL HIP ARTHROPLASTY;  Surgeon: Aurther Blue, MD;  Location: [facility];  Service: Orthopedics;  Laterality: Right;   TOTAL HIP ARTHROPLASTY Left 01/15/2022   Procedure: TOTAL HIP ARTHROPLASTY ANTERIOR APPROACH;  Surgeon: Liliane Rei, MD;  Location: [facility];  Service: Orthopedics;  Laterality: Left;   TOTAL KNEE ARTHROPLASTY  09/29/2011   Procedure: TOTAL KNEE ARTHROPLASTY;  Surgeon: Aurther Blue;  Location: [facility];  Service: Orthopedics;  Laterality: Left;   TUBAL LIGATION  1981   Social History   Occupational History   Not on file  Tobacco Use   Smoking status: Never   Smokeless tobacco: Never  Vaping Use   Vaping status: Never Used  Substance and Sexual Activity   Alcohol  use: No   Drug use: No   Sexual activity: Not on file

## 2024-06-13 ENCOUNTER — Telehealth: Payer: Self-pay

## 2024-06-13 ENCOUNTER — Ambulatory Visit: Admitting: Student

## 2024-06-13 ENCOUNTER — Encounter: Payer: Self-pay | Admitting: Student

## 2024-06-13 VITALS — BP 155/85 | HR 85 | Ht 70.0 in | Wt 246.2 lb

## 2024-06-13 DIAGNOSIS — N62 Hypertrophy of breast: Secondary | ICD-10-CM

## 2024-06-13 MED ORDER — ONDANSETRON HCL 4 MG PO TABS: 4.0000 mg | ORAL_TABLET | Freq: Three times a day (TID) | ORAL | 0 refills | Status: DC | PRN

## 2024-06-13 MED ORDER — TRAMADOL HCL 50 MG PO TABS: 50.0000 mg | ORAL_TABLET | Freq: Three times a day (TID) | ORAL | 0 refills | Status: DC | PRN

## 2024-06-13 NOTE — Progress Notes (Addendum)
 Patient ID: Christina Duarte, female    DOB: April 11, 1946, 78 y.o.   MRN: [id number]  Chief Complaint  Patient presents with   Pre-op Exam      ICD-10-CM   1. Macromastia  N62        History of Present Illness: Christina Duarte is a 78 y.o.  female  with a history of macromastia.  She presents for preoperative evaluation for upcoming procedure, Bilateral Breast Reduction, scheduled for 06/28/24 with Dr.  Waddell  The patient has not had problems with anesthesia.  Patient denies any personal history of breast cancer.  She reports 2 of her cousins had breast cancer.  She does report history of PSVT.  She states she currently takes a baby aspirin once a day.  Patient reports she is not a smoker.  Patient denies taking any birth control or hormone replacement.  She denies any history of greater than 3 miscarriages.  Patient states that she thinks she may have had a blood clot after her knee replacement in her right leg in 2007.  She states that she was started on blood thinners, but she was unsure if she 100% had a blood clot or not.  Patient does report that her aunt passed away from a blood clot.  Patient denies any history of clotting diseases.  Patient states that she broke her nose back in May after a fall, but states that she has not had any issues since then.  She otherwise denies any recent surgeries, traumas or infections.  She denies any history of stroke or heart attack.  She denies any history of Crohn's disease or ulcerative colitis.  She denies any history of COPD or asthma.  She denies any history of cancer.  Patient reports that she did have history of varicose veins to her lower extremities, but had these removed.  She denies otherwise any other recent fevers, chills or changes in her health  Summary of Previous Visit: Patient was seen for initial consult by Dr. Waddell on 03/02/2024.  Patient at this time reported upper back and neck pain due to the size of enlarged breasts.  On exam, her  STN on the right was 45 cm and her STN on the left is 49 cm.  It was noted that patient had extraordinary large ptotic breasts with a greater than 2 cup size asymmetry.  It was estimated that patient would have at least 1000 g removed from the left and 1000 g removed from the right  Per chart review, patient had a telephone visit with her cardiology provider, Barnie Hila NP, on 03/11/2024.  Per cardiology note, patient has had an acceptable risk for the planned procedure without any further cardiovascular testing.  It was also noted that patient had never taken Xarelto  before.  Per chart review, it appears that clearance to patient's PCP, Dr. Gerome has been send.  It does not appear that it has been received.  I encouraged the patient to follow-up with them for this clearance.  Estimated excess breast tissue to be removed at time of surgery: 1000 grams  Job: Does not work at this time  PMH Significant for: PSVT, osteoarthritis, macromastia   Past Medical History: Allergies: Allergies  Allergen Reactions   Aspirin Other (See Comments)    Pt cant take when given with codeine. Makes her feel loopy Can take aspirin alone w/o any problem.    Codeine Other (See Comments)    Pt cant take when given  with aspirin  Can take with Tylenol       Current Medications:  Current Outpatient Medications:    acetaminophen  (TYLENOL ) 650 MG CR tablet, Take 1,300 mg by mouth every 8 (eight) hours as needed for pain., Disp: , Rfl:    amLODipine  (NORVASC ) 2.5 MG tablet, Take 2.5 mg by mouth daily., Disp: , Rfl:    atorvastatin  (LIPITOR) 40 MG tablet, Take 40 mg by mouth daily., Disp: , Rfl:    Carboxymeth-Glyc-Polysorb PF (REFRESH OPTIVE MEGA-3) 0.5-1-0.5 % SOLN, Place 1 drop into both eyes in the morning, at noon, in the evening, and at bedtime., Disp: , Rfl:    Cholecalciferol (VITAMIN D) 50 MCG (2000 UT) CAPS, Take by mouth., Disp: , Rfl:    cyanocobalamin (VITAMIN B12) 500 MCG tablet, Take 500  mcg by mouth daily., Disp: , Rfl:    ketotifen (ZADITOR) 0.035 % ophthalmic solution, Place 1 drop into both eyes in the morning and at bedtime., Disp: , Rfl:    metoprolol  succinate (TOPROL -XL) 50 MG 24 hr tablet, Take 50 mg by mouth daily., Disp: , Rfl:    Multiple Vitamins-Minerals (MULTIVITAMIN WITH MINERALS) tablet, Take 1 tablet by mouth daily., Disp: , Rfl:    ondansetron  (ZOFRAN ) 4 MG tablet, Take 1 tablet (4 mg total) by mouth every 8 (eight) hours as needed for up to 10 doses for nausea or vomiting., Disp: 10 tablet, Rfl: 0   traMADol  (ULTRAM ) 50 MG tablet, Take 1 tablet (50 mg total) by mouth every 8 (eight) hours as needed for up to 12 doses., Disp: 12 tablet, Rfl: 0   sodium chloride  (MURO 128) 2 % ophthalmic solution, Place 1 drop into both eyes in the morning and at bedtime., Disp: , Rfl:   Past Medical Problems: Past Medical History:  Diagnosis Date   Arthritis    ALL OVER  --OA LEFT KNEE-PLANS KNEE REPLACEMENT--S/P RT KNEE REPLACEMENT   Blood transfusion    Complication of anesthesia    woke up during colonoscopy   Dyspnea    With activity   H/O blood clots    RIGHT KNEE   Heart palpitations    PSVT (paroxysmal supraventricular tachycardia) (HCC) 03/06/2018   EKG at the emergency room 03/06/2018: SVT at a rate of 137 bpm,  anteroseptal infarct old.   PVC's (premature ventricular contractions)    PVC's (premature ventricular contractions)     Past Surgical History: Past Surgical History:  Procedure Laterality Date   BUNIONECTOMY Right 02/17/11   Woodard EDWARDS Right 01/20/13   McBride   COLONOSCOPY     ENDOVENOUS ABLATION SAPHENOUS VEIN W/ LASER Right 07/07/2018   endovenous laser ablation R greater saphenous vein and stab phlebectomy > 20 incisions R leg by Carlin Haddock MD    ENDOVENOUS ABLATION SAPHENOUS VEIN W/ LASER Left 10/06/2018   endovenous laser ablation left greater saphenous vein and stab phlebectomy > 20 incisions left leg by Carlin Haddock  MD    FOOT SURGERY  2012   RIGHT -BUNIONECTOMY   Hammer toe repair Right 02/17/11   HYSTEROSCOPY WITH D & C N/A 09/13/2013   Procedure: DILATATION AND CURETTAGE /HYSTEROSCOPY;  Surgeon: Peggye Gull, MD;  Location: [facility];  Service: Gynecology;  Laterality: N/A;   JOINT REPLACEMENT  2007    RIGHT KNEE   TONSILLECTOMY  1964   TOTAL HIP ARTHROPLASTY  08/11/2012   Procedure: TOTAL HIP ARTHROPLASTY;  Surgeon: Dempsey LULLA Moan, MD;  Location: [facility];  Service: Orthopedics;  Laterality: Right;  TOTAL HIP ARTHROPLASTY Left 01/15/2022   Procedure: TOTAL HIP ARTHROPLASTY ANTERIOR APPROACH;  Surgeon: Melodi Lerner, MD;  Location: [facility];  Service: Orthopedics;  Laterality: Left;   TOTAL KNEE ARTHROPLASTY  09/29/2011   Procedure: TOTAL KNEE ARTHROPLASTY;  Surgeon: Lerner LULLA Melodi;  Location: [facility];  Service: Orthopedics;  Laterality: Left;   TUBAL LIGATION  1981    Social History: Social History   Socioeconomic History   Marital status: Divorced    Spouse name: Not on file   Number of children: 3   Years of education: Not on file   Highest education level: Not on file  Occupational History   Not on file  Tobacco Use   Smoking status: Never   Smokeless tobacco: Never  Vaping Use   Vaping status: Never Used  Substance and Sexual Activity   Alcohol  use: No   Drug use: No   Sexual activity: Not on file  Other Topics Concern   Not on file  Social History Narrative   Not on file   Social Drivers of Health   Financial Resource Strain: Not on file  Food Insecurity: No Food Insecurity (01/07/2023)   Received from [facility]   Hunger Vital Sign    Within the past 12 months, you worried that your food would run out before you got the money to buy more.: Never true    Within the past 12 months, the food you bought just didn't last and you didn't have money to get more.: Never true  Transportation Needs: No Transportation Needs (01/07/2023)   Received from [facility]    In the past 12 months, has lack of transportation kept you from medical appointments or from getting medications?: No    Lack of Transportation (Non-Medical): No  Physical Activity: Not on file  Stress: Not on file  Social Connections: Not on file  Intimate Partner Violence: Not on file    Family History: Family History  Problem Relation Age of Onset   Heart disease Father    Colon cancer Neg Hx    Breast cancer Neg Hx     Review of Systems: Denies any recent fevers, chills or changes in her health  Physical Exam: Vital Signs BP (!) 155/85 (BP Location: Left Arm, Patient Position: Sitting, Cuff Size: Large)   Pulse 85   Ht 5' 10 (1.778 m)   Wt 246 lb 3.2 oz (111.7 kg)   SpO2 97%   BMI 35.33 kg/m   Physical Exam  Constitutional:      General: Not in acute distress.    Appearance: Normal appearance. Not ill-appearing.  HENT:     Head: Normocephalic and atraumatic.  Neck:     Musculoskeletal: Normal range of motion.  Cardiovascular:     Rate and Rhythm: Normal rate Pulmonary:     Effort: Pulmonary effort is normal. No respiratory distress.  Musculoskeletal: Normal range of motion.  Skin:    General: Skin is warm and dry.     Findings: No erythema or rash.  Neurological:     Mental Status: Alert and oriented to person, place, and time. Mental status is at baseline.  Psychiatric:        Mood and Affect: Mood normal.        Behavior: Behavior normal.    Assessment/Plan: The patient is scheduled for bilateral breast reduction with Dr. Waddell.  Risks, benefits, and alternatives of procedure discussed, questions answered and  consent obtained.    Smoking Status: Non-smoker; Counseling Given?  N/A Last Mammogram: 01/21/2024; Results: BI-RADS Category 1 negative  Caprini Score: 13; Risk Factors include: Age, history of thrombosis, family history of thrombosis, history of varicosities, BMI greater than 25, and length of  planned surgery. Recommendation for mechanical and possible pharmacological prophylaxis. Encourage early ambulation.  Will discuss the possibility of postoperative Lovenox with Dr. Waddell  Pictures obtained: @consult   Post-op Rx sent to pharmacy: Tramadol , Zofran   I instructed the patient to hold any vitamins or supplements at least 1 week prior to surgery.  We will plan on having her hold aspirin 1 week prior to surgery, will confirm with patient's PCP  Patient was provided with the breast reduction and General Surgical Risk consent document and Pain Medication Agreement prior to their appointment.  They had adequate time to read through the risk consent documents and Pain Medication Agreement. We also discussed them in person together during this preop appointment. All of their questions were answered to their satisfaction.  Recommended calling if they have any further questions.  Risk consent form and Pain Medication Agreement to be scanned into patient's chart.  The risk that can be encountered with breast reduction were discussed and include the following but not limited to these:  Breast asymmetry, fluid accumulation, firmness of the breast, inability to breast feed, loss of nipple or areola, skin loss, decrease or no nipple sensation, fat necrosis of the breast tissue, bleeding, infection, healing delay.  There are risks of anesthesia, changes to skin sensation and injury to nerves or blood vessels.  The muscle can be temporarily or permanently injured.  You may have an allergic reaction to tape, suture, glue, blood products which can result in skin discoloration, swelling, pain, skin lesions, poor healing.  Any of these can lead to the need for revisonal surgery or stage procedures.  A reduction has potential to interfere with diagnostic procedures.  Nipple or breast piercing can increase risks of infection.  This procedure is best done when the breast is fully developed.  Changes in the breast  will continue to occur over time.  Pregnancy can alter the outcomes of previous breast reduction surgery, weight gain and weigh loss can also effect the long term appearance.   We discussed the possibility of amputation/free nipple graft technique due to the length of her STN.  She is understanding of the possibility that we would need to transition from a pedicle technique to a free nipple graft technique intraoperatively.  We discussed the risks associated with free nipple graft breast reductions, including but not limited to failure of the graft, partial loss of the graft, loss of sensation of bilateral nipple areola, complete loss of the nipple areola graft, inability to breast-feed, postoperative wounds, ongoing wound care.  We also discussed the risks associated with the pedicle technique.  We discussed that with the pedicle technique she could develop nipple areolar necrosis which would result in loss of the nipple, this would also result in ongoing wound care and possible changes in the shape of her breast.    ADDEND: I spoke with Dr. Waddell in regards to patient's elevated Caprini score and given elevated Caprini score and risk factors, we will have to cancel surgery.  I did attempt to call the patient to discuss this with her, but she did not answer.  I left a voicemail to have her return my call.    Electronically signed by: Estefana FORBES Peck, PA-C 06/13/2024 12:44 PM

## 2024-06-13 NOTE — Addendum Note (Signed)
 Addended by: ANDRIS STAGGER on: 06/13/2024 03:43 PM   Modules accepted: Orders

## 2024-06-13 NOTE — Telephone Encounter (Signed)
 Sent surgical clearance to Dr. Lonell Collet (Fax# [phone]). Confirmation of receipt.

## 2024-06-15 ENCOUNTER — Telehealth: Payer: Self-pay

## 2024-06-15 NOTE — Telephone Encounter (Signed)
 Patient left message at 10:08 returning Melissa's phone call. Per note, patient is not a candidate for SX scheduled 8/26 due to Dr. Waddell requiring she be on peri-operative Lovenox because of caprini score of 13, family and personal Hx of DVT, weight, and age. He stated it puts her at an extremely high risk for developing a blood clot. He adv if patient had additional questions to schedule an appt with him. Patient wanted to talk to Dr. Waddell. I scheduled her next Wednesday, 8/20 @ 10 am. She agreed.

## 2024-06-22 ENCOUNTER — Ambulatory Visit: Admitting: Plastic Surgery

## 2024-06-22 ENCOUNTER — Encounter: Payer: Self-pay | Admitting: Plastic Surgery

## 2024-06-22 VITALS — BP 180/93 | HR 70 | Ht 70.0 in | Wt 239.8 lb

## 2024-06-22 DIAGNOSIS — I471 Supraventricular tachycardia, unspecified: Secondary | ICD-10-CM

## 2024-06-22 DIAGNOSIS — N62 Hypertrophy of breast: Secondary | ICD-10-CM | POA: Diagnosis not present

## 2024-06-22 NOTE — Progress Notes (Signed)
 Christina Duarte returns today to discuss why her surgery was canceled.  We had a long discussion regarding her risk factors for postoperative DVT.  She was calculated at 8 Caprini of 13.  I do not think that is reasonable to proceed with a breast reduction on her.  We discussed this at length.  While she was disappointed she understood that I was only canceling the surgery in her best interest.

## 2024-06-28 ENCOUNTER — Ambulatory Visit (HOSPITAL_BASED_OUTPATIENT_CLINIC_OR_DEPARTMENT_OTHER): Admission: RE | Admit: 2024-06-28 | Source: Home / Self Care | Admitting: Plastic Surgery

## 2024-06-28 ENCOUNTER — Encounter (HOSPITAL_BASED_OUTPATIENT_CLINIC_OR_DEPARTMENT_OTHER): Admission: RE | Payer: Self-pay | Source: Home / Self Care

## 2024-06-28 SURGERY — MAMMOPLASTY, REDUCTION
Anesthesia: Choice | Laterality: Bilateral

## 2024-06-29 ENCOUNTER — Encounter: Admitting: Surgical

## 2024-07-06 ENCOUNTER — Encounter: Admitting: Plastic Surgery

## 2024-07-26 ENCOUNTER — Encounter: Payer: Self-pay | Admitting: Family

## 2024-07-26 ENCOUNTER — Ambulatory Visit (INDEPENDENT_AMBULATORY_CARE_PROVIDER_SITE_OTHER): Admitting: Family

## 2024-07-26 DIAGNOSIS — B351 Tinea unguium: Secondary | ICD-10-CM | POA: Diagnosis not present

## 2024-07-26 NOTE — Progress Notes (Signed)
 Office Visit Note   Patient: Christina Duarte           Date of Birth: 24-Nov-1945           MRN: [id number] Visit Date: 07/26/2024              Requested by: Gerome Brunet, DO [address] [place],  [place] [address] PCP: Gerome Brunet, DO  Chief Complaint  Patient presents with   Right Foot - Follow-up   Left Foot - Follow-up      HPI: The patient is a 78 year old woman seen in routine follow-up for bilateral foot evaluation and nail trim.  She is unable to safely trim her nails.  Assessment & Plan: Visit Diagnoses: No diagnosis found.  Plan: Nails trimmed x 10.  Patient tolerated well.  Plan to reevaluate in 3 months.  Follow-Up Instructions: No follow-ups on file.   Ortho Exam  Patient is alert, oriented, no adenopathy, well-dressed, normal affect, normal respiratory effort. On examination bilateral feet she does have thickened and discolored onychomycotic nails x 10.  These were trimmed after informed consent.  Patient voiced relief. No calluses no deformity of the feet.  No ulceration.     Imaging: No results found. No images are attached to the encounter.  Labs: Lab Results  Component Value Date   REPTSTATUS 02/01/2018 FINAL 01/31/2018   CULT  01/31/2018    NO GROWTH Performed at [facility], [address] [place], [place] [address]    IDOLINA ROA PNEUMONIAE 10/02/2012     Lab Results  Component Value Date   ALBUMIN 4.3 01/02/2022   ALBUMIN 4.5 03/06/2018   ALBUMIN 4.7 01/31/2018    Lab Results  Component Value Date   MG 1.6 (L) 03/06/2018   No results found for: VD25OH  No results found for: PREALBUMIN    Latest Ref Rng & Units 01/17/2022    3:23 AM 01/16/2022    3:26 AM 01/02/2022   11:03 AM  CBC EXTENDED  WBC 4.0 - 10.5 K/uL 10.0  9.9  3.4   RBC 3.87 - 5.11 MIL/uL 4.58  4.25  4.92   Hemoglobin 12.0 - 15.0 g/dL 86.6  87.5  85.5   HCT 36.0 - 46.0 % 40.5  37.8  44.1   Platelets 150 - 400 K/uL 226  219   226      There is no height or weight on file to calculate BMI.  Orders:  No orders of the defined types were placed in this encounter.  No orders of the defined types were placed in this encounter.    Procedures: No procedures performed  Clinical Data: No additional findings.  ROS:  All other systems negative, except as noted in the HPI. Review of Systems  Objective: Vital Signs: There were no vitals taken for this visit.  Specialty Comments:  No specialty comments available.  PMFS History: Patient Active Problem List   Diagnosis Date Noted   Primary osteoarthritis of left hip 01/15/2022   Nonspecific abnormal electrocardiogram (ECG) (EKG) 03/13/2019   PSVT (paroxysmal supraventricular tachycardia) 03/06/2018   Varicose veins of bilateral lower extremities with other complications 01/04/2018   Status post foot surgery 01/31/2013   Postop Hypokalemia 08/12/2012   OA (osteoarthritis) of hip 08/11/2012   Hyponatremia 09/30/2011   Osteoarthritis of left knee 09/30/2011   Past Medical History:  Diagnosis Date   Arthritis    ALL OVER  --OA LEFT KNEE-PLANS KNEE REPLACEMENT--S/P RT KNEE REPLACEMENT  Blood transfusion    Complication of anesthesia    woke up during colonoscopy   Dyspnea    With activity   H/O blood clots    RIGHT KNEE   Heart palpitations    PSVT (paroxysmal supraventricular tachycardia) 03/06/2018   EKG at the emergency room 03/06/2018: SVT at a rate of 137 bpm,  anteroseptal infarct old.   PVC's (premature ventricular contractions)    PVC's (premature ventricular contractions)     Family History  Problem Relation Age of Onset   Heart disease Father    Colon cancer Neg Hx    Breast cancer Neg Hx     Past Surgical History:  Procedure Laterality Date   BUNIONECTOMY Right 02/17/11   [facility] EDWARDS Right 01/20/13   [facility]   COLONOSCOPY     ENDOVENOUS ABLATION SAPHENOUS VEIN W/ LASER Right 07/07/2018   endovenous laser ablation R  greater saphenous vein and stab phlebectomy > 20 incisions R leg by Carlin Haddock MD    ENDOVENOUS ABLATION SAPHENOUS VEIN W/ LASER Left 10/06/2018   endovenous laser ablation left greater saphenous vein and stab phlebectomy > 20 incisions left leg by Carlin Haddock MD    FOOT SURGERY  2012   RIGHT -BUNIONECTOMY   Hammer toe repair Right 02/17/11   HYSTEROSCOPY WITH D & C N/A 09/13/2013   Procedure: DILATATION AND CURETTAGE /HYSTEROSCOPY;  Surgeon: Peggye Gull, MD;  Location: [facility];  Service: Gynecology;  Laterality: N/A;   JOINT REPLACEMENT  2007    RIGHT KNEE   TONSILLECTOMY  1964   TOTAL HIP ARTHROPLASTY  08/11/2012   Procedure: TOTAL HIP ARTHROPLASTY;  Surgeon: Dempsey LULLA Moan, MD;  Location: [facility];  Service: Orthopedics;  Laterality: Right;   TOTAL HIP ARTHROPLASTY Left 01/15/2022   Procedure: TOTAL HIP ARTHROPLASTY ANTERIOR APPROACH;  Surgeon: Moan Dempsey, MD;  Location: [facility];  Service: Orthopedics;  Laterality: Left;   TOTAL KNEE ARTHROPLASTY  09/29/2011   Procedure: TOTAL KNEE ARTHROPLASTY;  Surgeon: Dempsey LULLA Moan;  Location: [facility];  Service: Orthopedics;  Laterality: Left;   TUBAL LIGATION  1981   Social History   Occupational History   Not on file  Tobacco Use   Smoking status: Never   Smokeless tobacco: Never  Vaping Use   Vaping status: Never Used  Substance and Sexual Activity   Alcohol  use: No   Drug use: No   Sexual activity: Not on file

## 2024-08-18 ENCOUNTER — Telehealth: Payer: Self-pay | Admitting: Plastic Surgery

## 2024-08-18 NOTE — Telephone Encounter (Signed)
 Patient stated that you told her you couldn't do the surgery but recommend she goes to [facility] bras, but she would like to know if its possible to get a prescription to see if insurance will pay, please advise.

## 2024-09-05 ENCOUNTER — Encounter: Payer: Self-pay | Admitting: Radiology

## 2024-10-25 ENCOUNTER — Ambulatory Visit: Admitting: Family

## 2024-11-01 ENCOUNTER — Encounter: Payer: Self-pay | Admitting: Family

## 2024-11-01 ENCOUNTER — Ambulatory Visit (INDEPENDENT_AMBULATORY_CARE_PROVIDER_SITE_OTHER): Admitting: Family

## 2024-11-01 DIAGNOSIS — B351 Tinea unguium: Secondary | ICD-10-CM

## 2024-11-01 DIAGNOSIS — Z9889 Other specified postprocedural states: Secondary | ICD-10-CM

## 2024-11-01 NOTE — Progress Notes (Signed)
 "  Office Visit Note   Patient: Christina Duarte           Date of Birth: Aug 21, 1946           MRN: [id number] Visit Date: 11/01/2024              Requested by: Gerome Brunet, DO [address] [place],  [place] [address] PCP: Gerome Brunet, DO  Chief Complaint  Patient presents with   Other    Bilateral foot-toenail trim      HPI: The patient is a 78 year old woman seen in routine follow-up for bilateral foot evaluation and nail trim.  She is unable to safely trim her nails.  Assessment & Plan: Visit Diagnoses: No diagnosis found.  Plan: Nails trimmed x 10.  Patient tolerated well.  Plan to reevaluate in 3 months.  Follow-Up Instructions: No follow-ups on file.   Ortho Exam  Patient is alert, oriented, no adenopathy, well-dressed, normal affect, normal respiratory effort. On examination bilateral feet she does have thickened and discolored onychomycotic nails x 10.  These were trimmed after informed consent.  Patient voiced relief. No calluses no deformity of the feet.  No ulceration.     Imaging: No results found. No images are attached to the encounter.  Labs: Lab Results  Component Value Date   REPTSTATUS 02/01/2018 FINAL 01/31/2018   CULT  01/31/2018    NO GROWTH Performed at [facility], [address] [place], [place] [address]    IDOLINA ROA PNEUMONIAE 10/02/2012     Lab Results  Component Value Date   ALBUMIN 4.3 01/02/2022   ALBUMIN 4.5 03/06/2018   ALBUMIN 4.7 01/31/2018    Lab Results  Component Value Date   MG 1.6 (L) 03/06/2018   No results found for: VD25OH  No results found for: PREALBUMIN    Latest Ref Rng & Units 01/17/2022    3:23 AM 01/16/2022    3:26 AM 01/02/2022   11:03 AM  CBC EXTENDED  WBC 4.0 - 10.5 K/uL 10.0  9.9  3.4   RBC 3.87 - 5.11 MIL/uL 4.58  4.25  4.92   Hemoglobin 12.0 - 15.0 g/dL 86.6  87.5  85.5   HCT 36.0 - 46.0 % 40.5  37.8  44.1   Platelets 150 - 400 K/uL 226  219  226       There is no height or weight on file to calculate BMI.  Orders:  No orders of the defined types were placed in this encounter.  No orders of the defined types were placed in this encounter.    Procedures: No procedures performed  Clinical Data: No additional findings.  ROS:  All other systems negative, except as noted in the HPI. Review of Systems  Objective: Vital Signs: There were no vitals taken for this visit.  Specialty Comments:  No specialty comments available.  PMFS History: Patient Active Problem List   Diagnosis Date Noted   Primary osteoarthritis of left hip 01/15/2022   Nonspecific abnormal electrocardiogram (ECG) (EKG) 03/13/2019   PSVT (paroxysmal supraventricular tachycardia) 03/06/2018   Varicose veins of bilateral lower extremities with other complications 01/04/2018   Status post foot surgery 01/31/2013   Postop Hypokalemia 08/12/2012   OA (osteoarthritis) of hip 08/11/2012   Hyponatremia 09/30/2011   Osteoarthritis of left knee 09/30/2011   Past Medical History:  Diagnosis Date   Arthritis    ALL OVER  --OA LEFT KNEE-PLANS KNEE REPLACEMENT--S/P RT KNEE REPLACEMENT   Blood  transfusion    Complication of anesthesia    woke up during colonoscopy   Dyspnea    With activity   H/O blood clots    RIGHT KNEE   Heart palpitations    PSVT (paroxysmal supraventricular tachycardia) 03/06/2018   EKG at the emergency room 03/06/2018: SVT at a rate of 137 bpm,  anteroseptal infarct old.   PVC's (premature ventricular contractions)    PVC's (premature ventricular contractions)     Family History  Problem Relation Age of Onset   Heart disease Father    Colon cancer Neg Hx    Breast cancer Neg Hx     Past Surgical History:  Procedure Laterality Date   BUNIONECTOMY Right 02/17/11   Woodard EDWARDS Right 01/20/13   [facility]   COLONOSCOPY     ENDOVENOUS ABLATION SAPHENOUS VEIN W/ LASER Right 07/07/2018   endovenous laser ablation R  greater saphenous vein and stab phlebectomy > 20 incisions R leg by Carlin Haddock MD    ENDOVENOUS ABLATION SAPHENOUS VEIN W/ LASER Left 10/06/2018   endovenous laser ablation left greater saphenous vein and stab phlebectomy > 20 incisions left leg by Carlin Haddock MD    FOOT SURGERY  2012   RIGHT -BUNIONECTOMY   Hammer toe repair Right 02/17/11   HYSTEROSCOPY WITH D & C N/A 09/13/2013   Procedure: DILATATION AND CURETTAGE /HYSTEROSCOPY;  Surgeon: Peggye Gull, MD;  Location: [facility];  Service: Gynecology;  Laterality: N/A;   JOINT REPLACEMENT  2007    RIGHT KNEE   TONSILLECTOMY  1964   TOTAL HIP ARTHROPLASTY  08/11/2012   Procedure: TOTAL HIP ARTHROPLASTY;  Surgeon: Dempsey LULLA Moan, MD;  Location: [facility];  Service: Orthopedics;  Laterality: Right;   TOTAL HIP ARTHROPLASTY Left 01/15/2022   Procedure: TOTAL HIP ARTHROPLASTY ANTERIOR APPROACH;  Surgeon: Moan Dempsey, MD;  Location: [facility];  Service: Orthopedics;  Laterality: Left;   TOTAL KNEE ARTHROPLASTY  09/29/2011   Procedure: TOTAL KNEE ARTHROPLASTY;  Surgeon: Dempsey LULLA Moan;  Location: [facility];  Service: Orthopedics;  Laterality: Left;   TUBAL LIGATION  1981   Social History   Occupational History   Not on file  Tobacco Use   Smoking status: Never   Smokeless tobacco: Never  Vaping Use   Vaping status: Never Used  Substance and Sexual Activity   Alcohol  use: No   Drug use: No   Sexual activity: Not on file       "

## 2025-01-31 ENCOUNTER — Ambulatory Visit: Admitting: Family
